# Patient Record
Sex: Female | Born: 1955
Health system: Southern US, Community
[De-identification: ages and names within clinical notes are randomized; demographics above are authoritative.]

## PROBLEM LIST (undated history)

## (undated) DIAGNOSIS — E7800 Pure hypercholesterolemia, unspecified: Secondary | ICD-10-CM

## (undated) DIAGNOSIS — R42 Dizziness and giddiness: Secondary | ICD-10-CM

## (undated) DIAGNOSIS — M5417 Radiculopathy, lumbosacral region: Secondary | ICD-10-CM

## (undated) DIAGNOSIS — F419 Anxiety disorder, unspecified: Secondary | ICD-10-CM

## (undated) DIAGNOSIS — T7840XA Allergy, unspecified, initial encounter: Secondary | ICD-10-CM

## (undated) DIAGNOSIS — M545 Low back pain, unspecified: Secondary | ICD-10-CM

## (undated) DIAGNOSIS — G4700 Insomnia, unspecified: Secondary | ICD-10-CM

## (undated) DIAGNOSIS — K219 Gastro-esophageal reflux disease without esophagitis: Secondary | ICD-10-CM

## (undated) DIAGNOSIS — I10 Essential (primary) hypertension: Secondary | ICD-10-CM

## (undated) DIAGNOSIS — J449 Chronic obstructive pulmonary disease, unspecified: Secondary | ICD-10-CM

## (undated) HISTORY — DX: Allergy, unspecified, initial encounter: T78.40XA

## (undated) HISTORY — DX: Insomnia, unspecified: G47.00

## (undated) HISTORY — DX: Chronic obstructive pulmonary disease, unspecified: J44.9

## (undated) HISTORY — DX: Radiculopathy, lumbosacral region: M54.17

## (undated) HISTORY — DX: Low back pain, unspecified: M54.50

## (undated) HISTORY — DX: Dizziness and giddiness: R42

## (undated) HISTORY — DX: Gastro-esophageal reflux disease without esophagitis: K21.9

## (undated) HISTORY — DX: Pure hypercholesterolemia, unspecified: E78.00

## (undated) HISTORY — DX: Anxiety disorder, unspecified: F41.9

## (undated) HISTORY — DX: Low back pain: M54.5

---

## 1994-10-06 HISTORY — PX: BREAST EXCISIONAL BIOPSY: SUR124

## 2004-12-29 ENCOUNTER — Emergency Department: Payer: Self-pay | Admitting: Emergency Medicine

## 2005-01-06 ENCOUNTER — Ambulatory Visit: Payer: Self-pay | Admitting: Urology

## 2005-03-10 ENCOUNTER — Ambulatory Visit: Payer: Self-pay

## 2005-10-05 ENCOUNTER — Other Ambulatory Visit: Payer: Self-pay

## 2005-10-05 ENCOUNTER — Emergency Department: Payer: Self-pay | Admitting: Emergency Medicine

## 2006-04-07 ENCOUNTER — Ambulatory Visit: Payer: Self-pay | Admitting: Family Medicine

## 2006-04-15 ENCOUNTER — Ambulatory Visit: Payer: Self-pay | Admitting: Family Medicine

## 2006-05-01 ENCOUNTER — Ambulatory Visit: Payer: Self-pay | Admitting: Family Medicine

## 2006-05-19 ENCOUNTER — Ambulatory Visit: Payer: Self-pay | Admitting: Family Medicine

## 2007-05-01 DIAGNOSIS — K219 Gastro-esophageal reflux disease without esophagitis: Secondary | ICD-10-CM | POA: Insufficient documentation

## 2007-05-01 DIAGNOSIS — K2100 Gastro-esophageal reflux disease with esophagitis, without bleeding: Secondary | ICD-10-CM | POA: Insufficient documentation

## 2007-08-14 ENCOUNTER — Ambulatory Visit: Payer: Self-pay | Admitting: Family Medicine

## 2007-08-25 ENCOUNTER — Ambulatory Visit: Payer: Self-pay | Admitting: Family Medicine

## 2007-11-15 IMAGING — US ULTRASOUND RIGHT BREAST
1 series · 17 of 22 positions shown · non-contrast
Comparison: none

REASON FOR EXAM: Right breast density   US if needed
COMMENTS:

PROCEDURE:     US  - US BREAST RIGHT  - April 15, 2006  [DATE]
RESULT:        At 1 o'clock and 3 o'clock there are small 4 mm cysts.  The
cyst at approximately 3 o'clock  corresponds to mammographic abnormality.
It is suggested that the patient resume yearly follow-up mammograms.

[Series 1: ultrasound right breast · 17 of 22 slices shown]
[im 1/22]
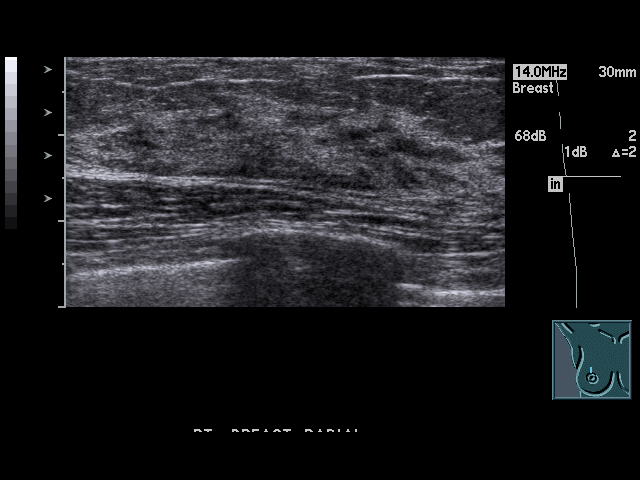
[im 2/22]
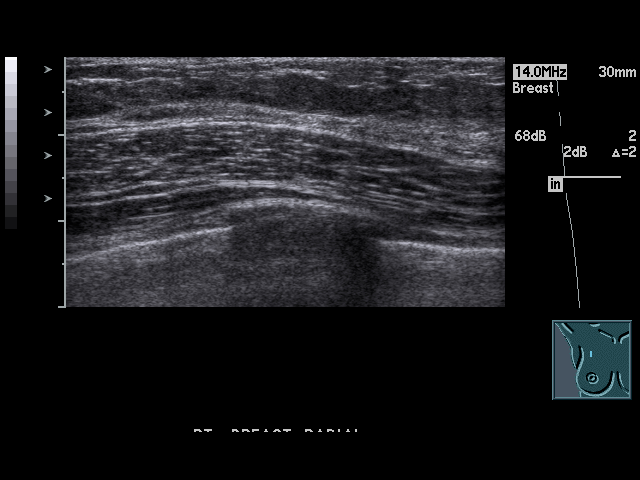
[im 4/22]
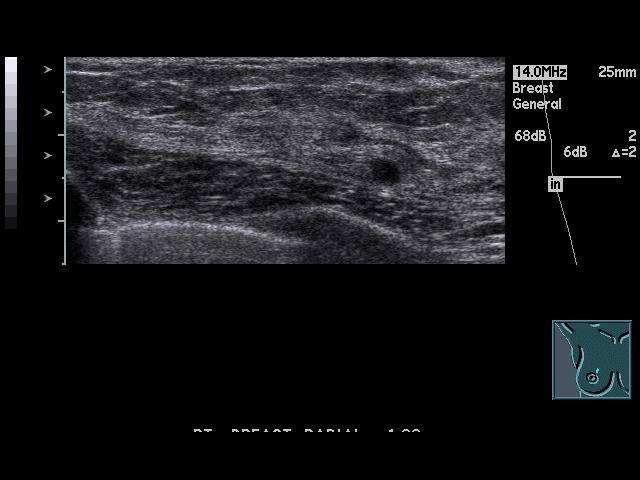
[im 5/22]
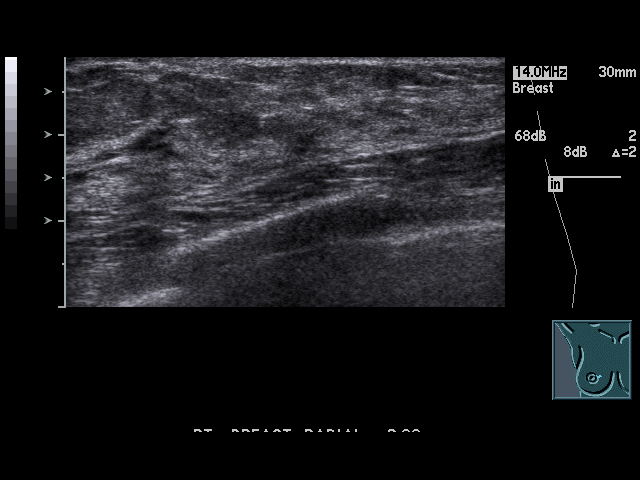
[im 6/22]
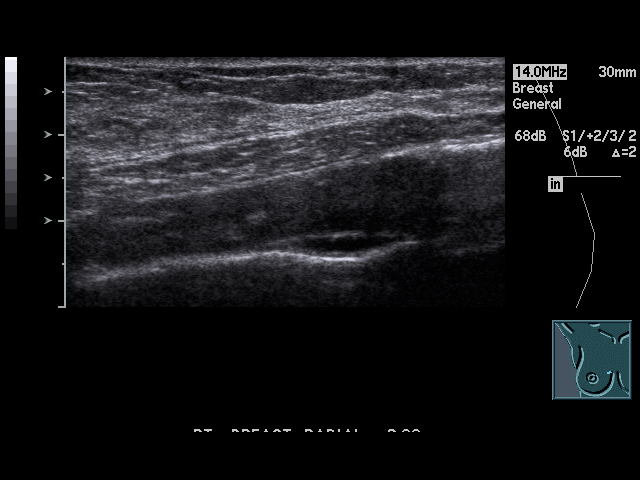
[im 8/22]
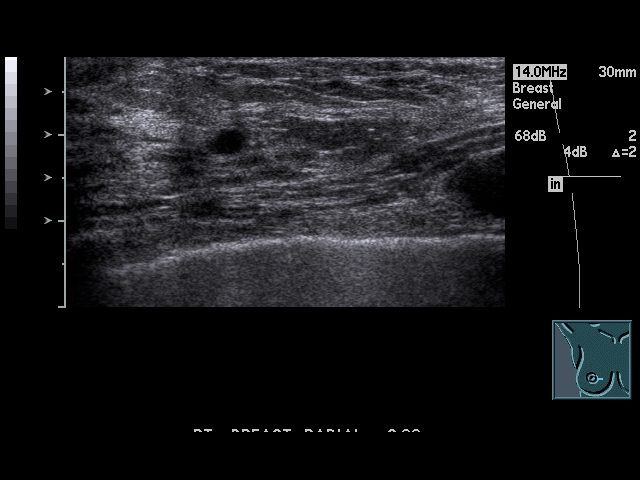
[im 9/22]
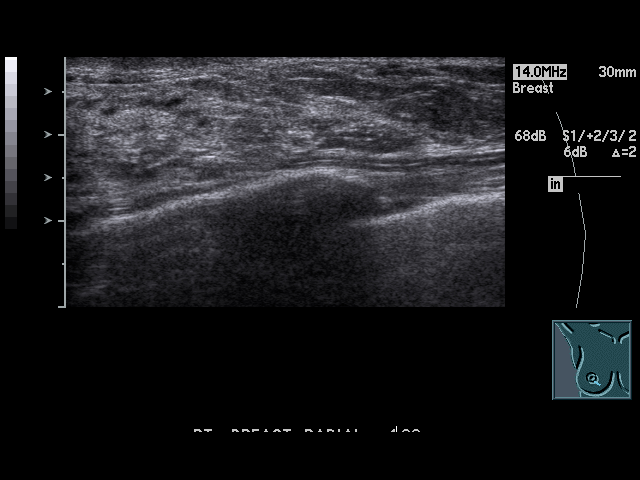
[im 10/22]
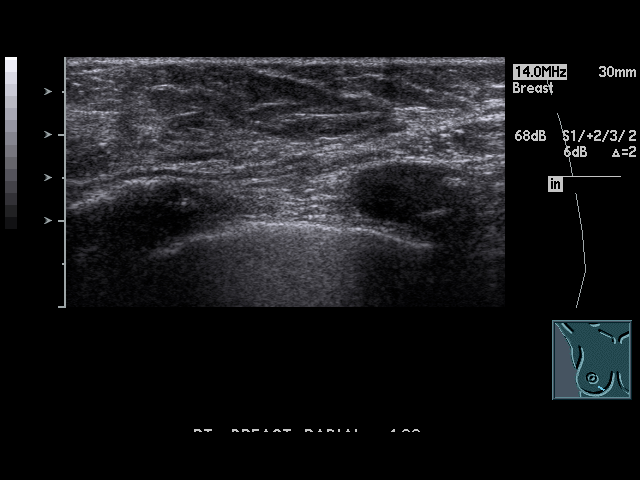
[im 12/22]
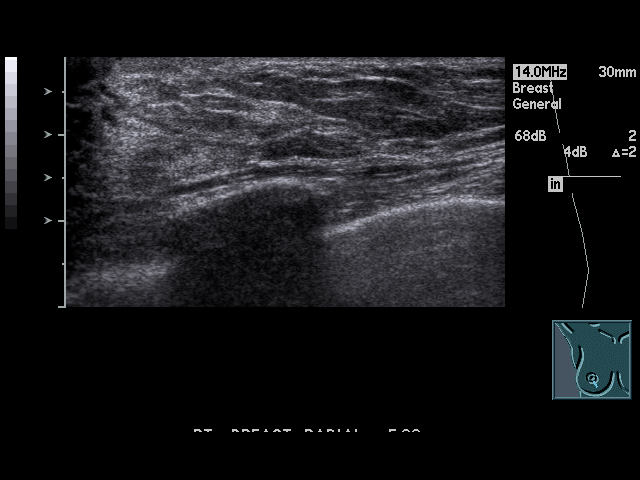
[im 13/22]
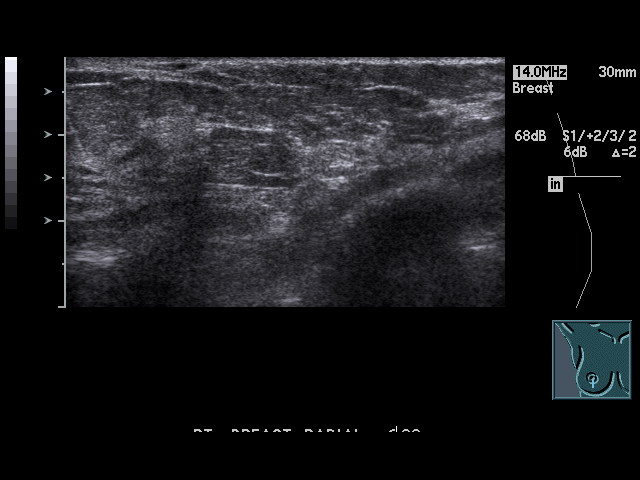
[im 14/22]
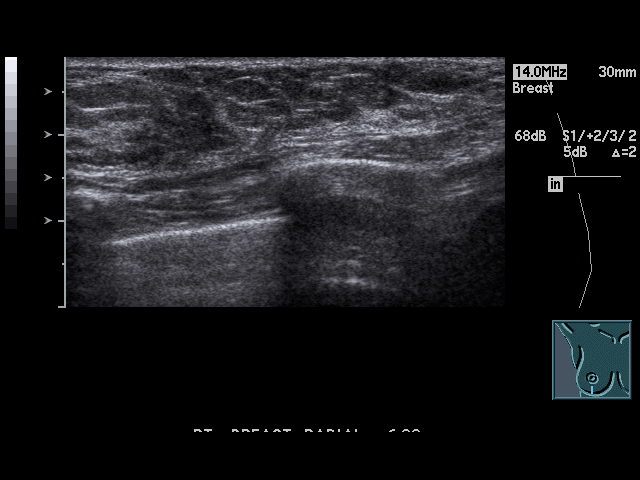
[im 15/22]
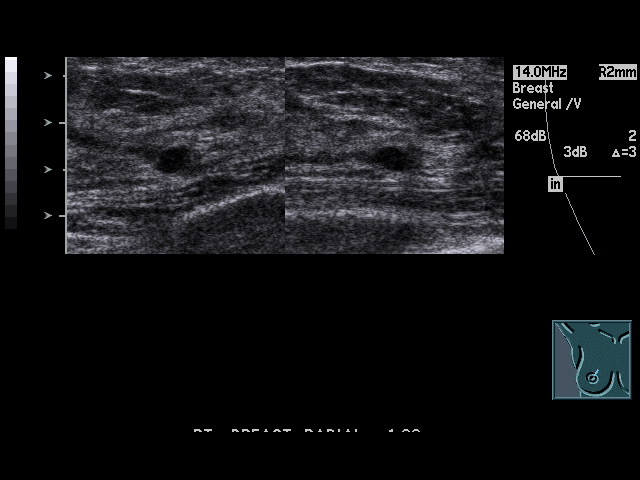
[im 17/22]
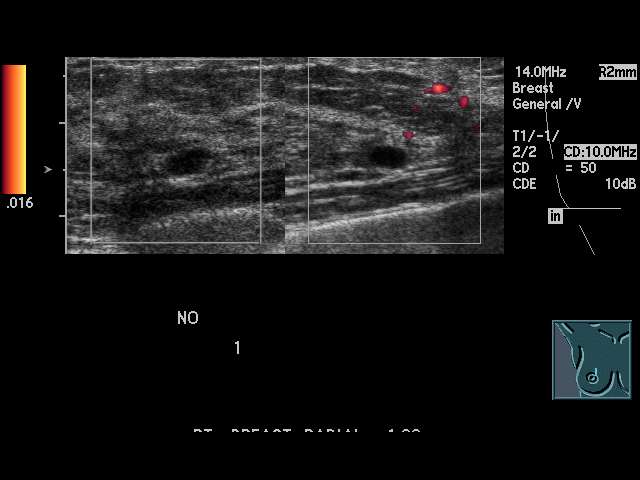
[im 18/22]
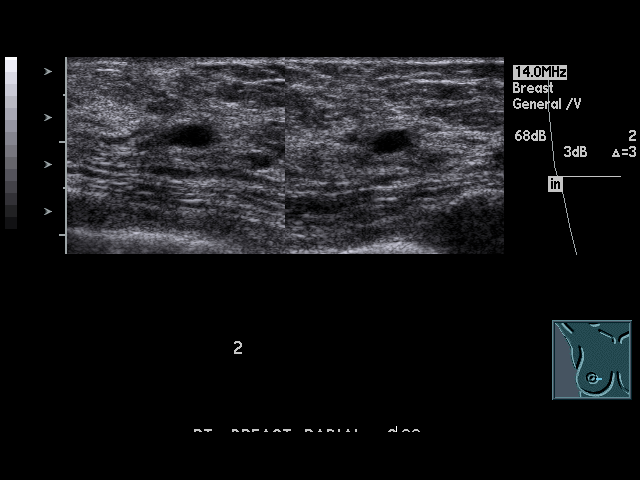
[im 19/22]
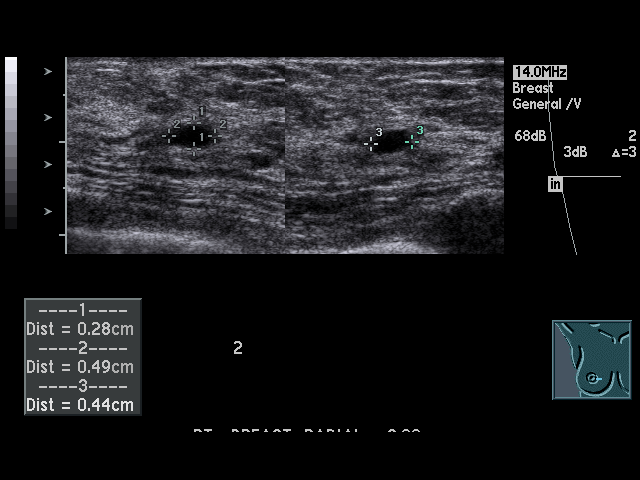
[im 21/22]
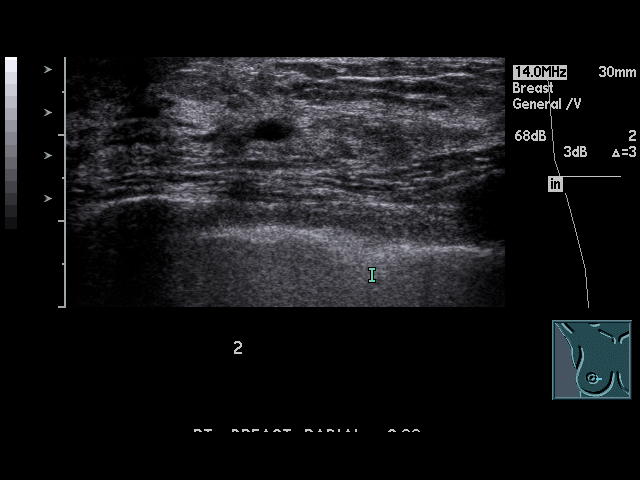
[im 22/22]
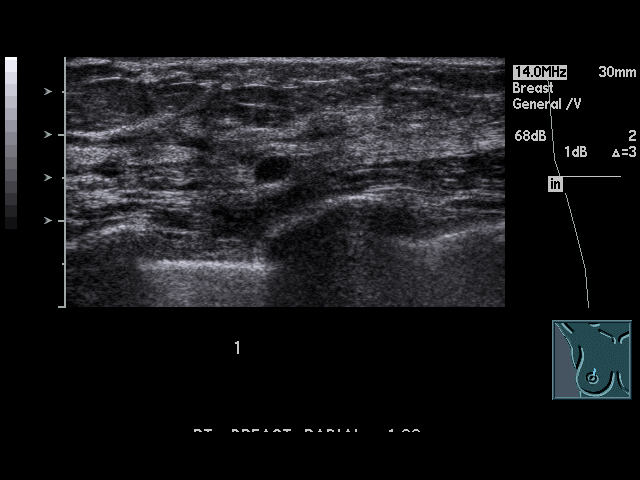

[17 of 22 positions shown; findings below may reference images not displayed]

IMPRESSION: BI-RADS: Category 2-Benign Findings.

A NEGATIVE MAMMOGRAM REPORT DOES NOT PRECLUDE BIOPSY OR OTHER EVALUATION OF
A CLINICALLY PALPABLE OR OTHERWISE SUSPICIOUS MASS OR LESION.   BREAST

## 2008-03-04 ENCOUNTER — Emergency Department: Payer: Self-pay | Admitting: Emergency Medicine

## 2008-11-19 ENCOUNTER — Emergency Department: Payer: Self-pay | Admitting: Emergency Medicine

## 2008-12-27 ENCOUNTER — Ambulatory Visit: Payer: Self-pay | Admitting: Family Medicine

## 2009-01-07 ENCOUNTER — Emergency Department: Payer: Self-pay | Admitting: Emergency Medicine

## 2009-02-04 ENCOUNTER — Emergency Department: Payer: Self-pay | Admitting: Emergency Medicine

## 2009-04-05 ENCOUNTER — Emergency Department: Payer: Self-pay | Admitting: Emergency Medicine

## 2009-06-28 ENCOUNTER — Other Ambulatory Visit: Payer: Self-pay | Admitting: Internal Medicine

## 2010-03-20 ENCOUNTER — Ambulatory Visit: Payer: Self-pay | Admitting: Family Medicine

## 2010-12-22 ENCOUNTER — Emergency Department: Payer: Self-pay | Admitting: Internal Medicine

## 2011-01-07 ENCOUNTER — Ambulatory Visit: Payer: Self-pay | Admitting: Family Medicine

## 2011-02-09 ENCOUNTER — Emergency Department: Payer: Self-pay | Admitting: Unknown Physician Specialty

## 2011-02-25 ENCOUNTER — Ambulatory Visit: Payer: Self-pay | Admitting: Family Medicine

## 2011-02-25 DIAGNOSIS — I369 Nonrheumatic tricuspid valve disorder, unspecified: Secondary | ICD-10-CM

## 2011-06-04 ENCOUNTER — Ambulatory Visit: Payer: Self-pay | Admitting: Family Medicine

## 2011-10-18 ENCOUNTER — Emergency Department: Payer: Self-pay | Admitting: *Deleted

## 2011-10-18 LAB — CBC
HCT: 42.5 % (ref 35.0–47.0)
HGB: 14.1 g/dL (ref 12.0–16.0)
MCV: 93 fL (ref 80–100)
Platelet: 231 10*3/uL (ref 150–440)
RBC: 4.56 10*6/uL (ref 3.80–5.20)
RDW: 14.1 % (ref 11.5–14.5)
WBC: 5.6 10*3/uL (ref 3.6–11.0)

## 2011-10-18 LAB — URINALYSIS, COMPLETE
Bacteria: NONE SEEN
Glucose,UR: NEGATIVE mg/dL (ref 0–75)
Ketone: NEGATIVE
Leukocyte Esterase: NEGATIVE
Nitrite: NEGATIVE
Ph: 6 (ref 4.5–8.0)
RBC,UR: 40 /HPF (ref 0–5)
Specific Gravity: 1.003 (ref 1.003–1.030)
Squamous Epithelial: <1

## 2011-10-18 LAB — DRUG SCREEN, URINE
Amphetamines, Ur Screen: NEGATIVE (ref ?–1000)
Benzodiazepine, Ur Scrn: NEGATIVE (ref ?–200)
Cannabinoid 50 Ng, Ur ~~LOC~~: NEGATIVE (ref ?–50)
MDMA (Ecstasy)Ur Screen: NEGATIVE (ref ?–500)
Opiate, Ur Screen: NEGATIVE (ref ?–300)
Phencyclidine (PCP) Ur S: NEGATIVE (ref ?–25)
Tricyclic, Ur Screen: NEGATIVE (ref ?–1000)

## 2011-10-18 LAB — COMPREHENSIVE METABOLIC PANEL
Alkaline Phosphatase: 86 U/L (ref 50–136)
Anion Gap: 10 (ref 7–16)
BUN: 11 mg/dL (ref 7–18)
Bilirubin,Total: 0.3 mg/dL (ref 0.2–1.0)
Calcium, Total: 9 mg/dL (ref 8.5–10.1)
Chloride: 106 mmol/L (ref 98–107)
Co2: 28 mmol/L (ref 21–32)
Creatinine: 0.59 mg/dL — ABNORMAL LOW (ref 0.60–1.30)
EGFR (Non-African Amer.): >60
SGOT(AST): 21 U/L (ref 15–37)
SGPT (ALT): 15 U/L
Total Protein: 7 g/dL (ref 6.4–8.2)

## 2011-10-18 LAB — CK TOTAL AND CKMB (NOT AT ARMC): CK-MB: 1.4 ng/mL (ref 0.5–3.6)

## 2011-10-18 LAB — TROPONIN I: Troponin-I: <0.02 ng/mL

## 2011-10-18 LAB — TSH: Thyroid Stimulating Horm: 1.81 u[IU]/mL

## 2012-01-26 DIAGNOSIS — R509 Fever, unspecified: Secondary | ICD-10-CM

## 2012-07-23 IMAGING — CR DG CHEST 2V
1 series · 2 of 2 positions shown · non-contrast
Comparison: none

REASON FOR EXAM: chest pain
COMMENTS:   May transport without cardiac monitor

[Series 1: view not recorded · 0.17mm/px · 2 of 2 slices shown]
[im 1/2]
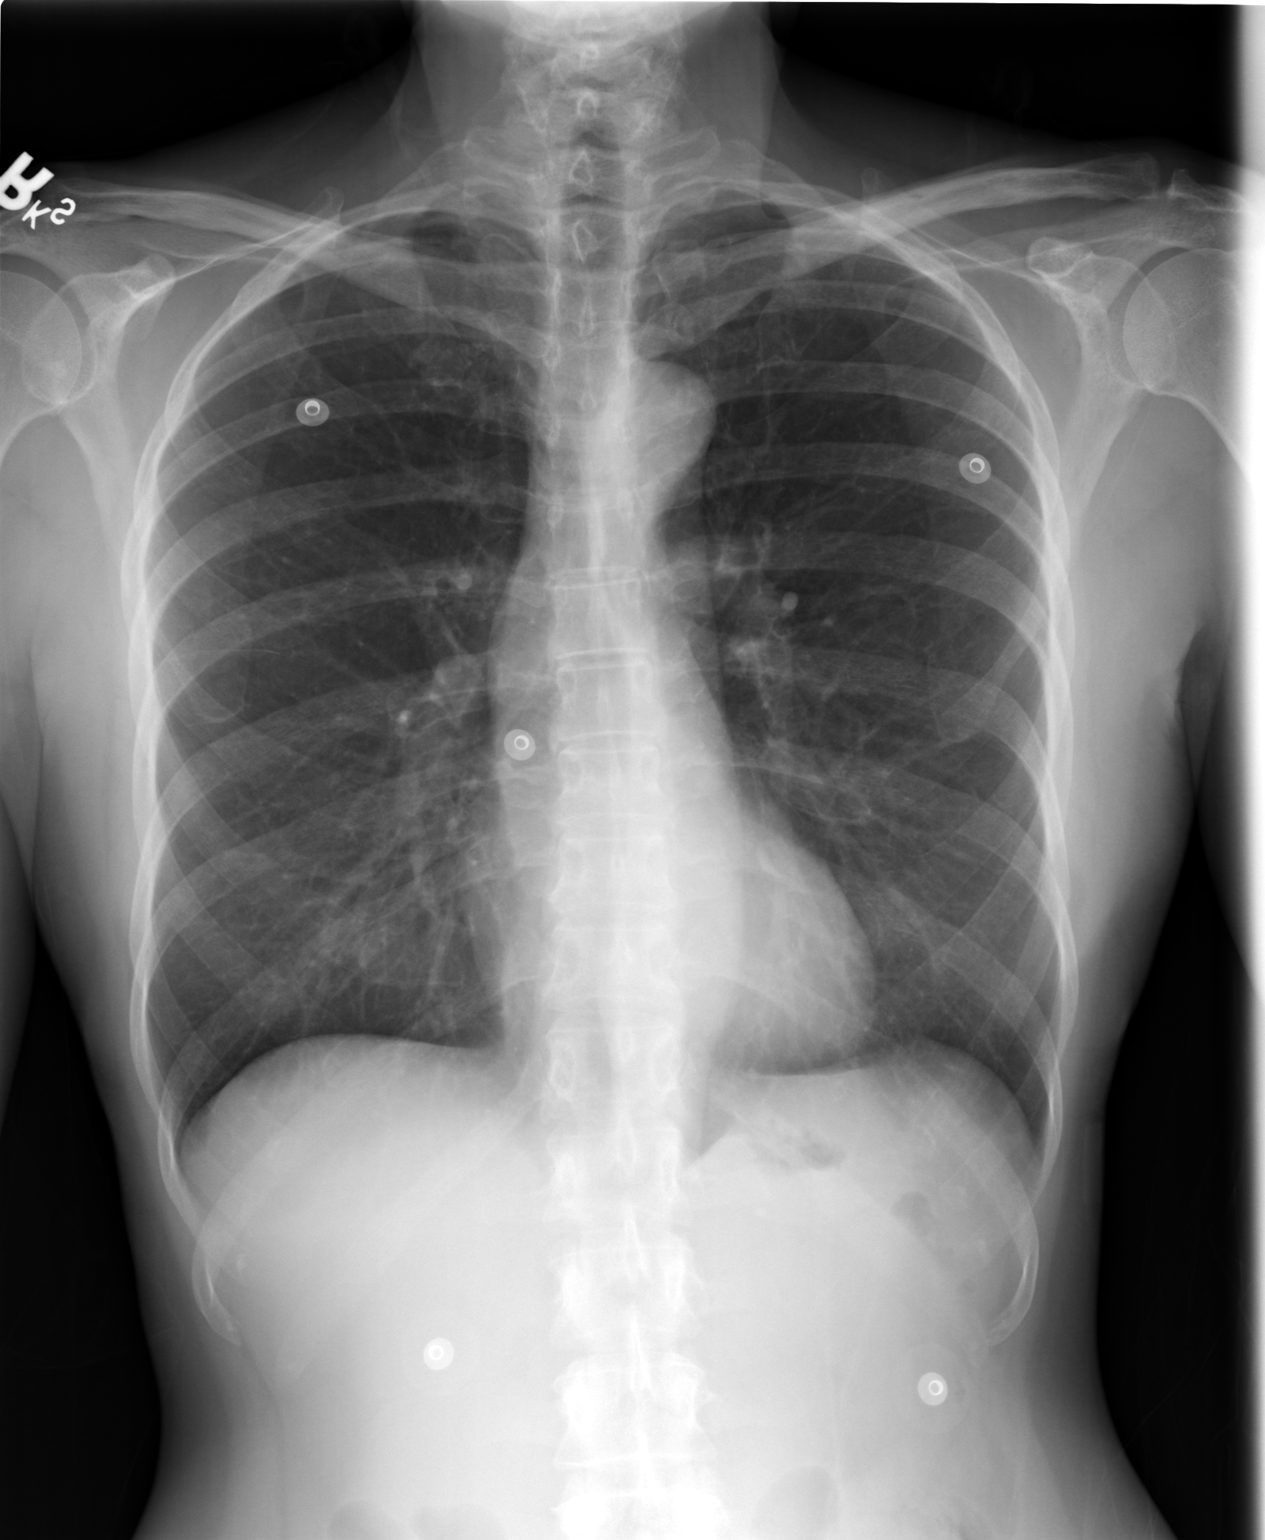
[im 2/2]
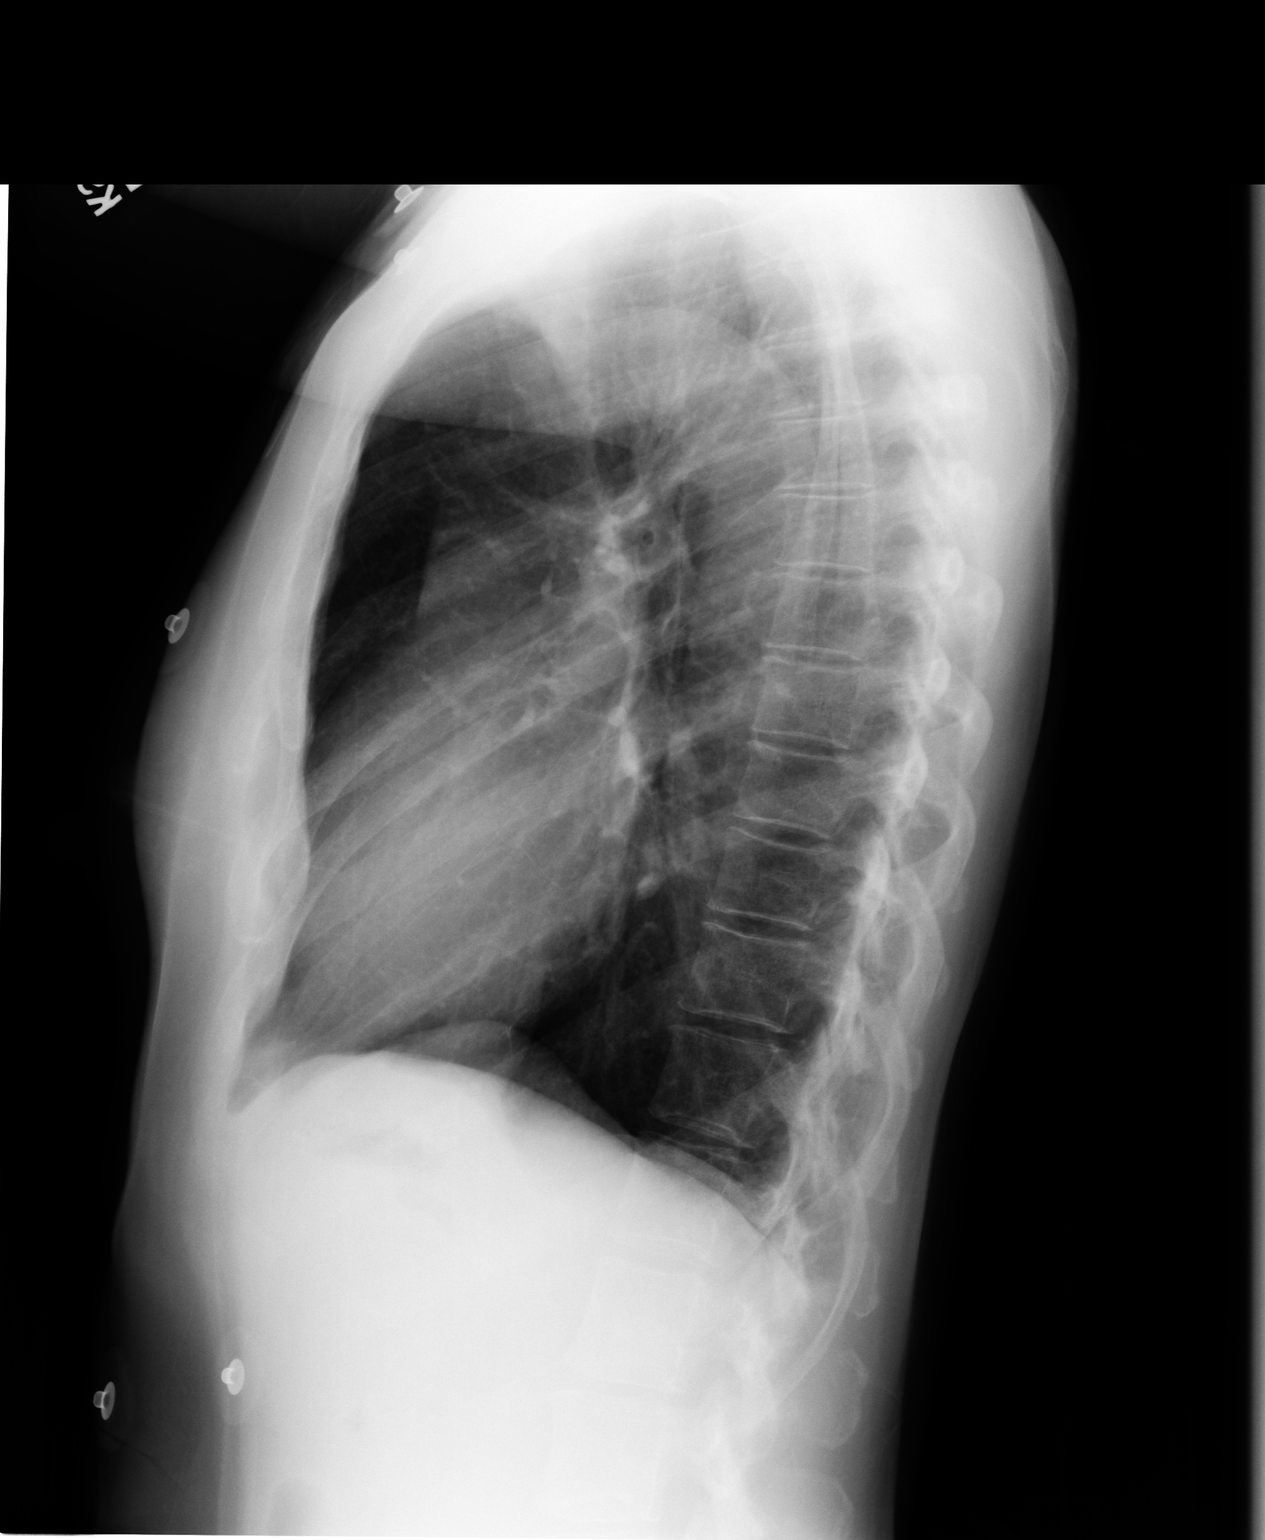

[2 of 2 positions shown; findings below may reference images not displayed]

PROCEDURE:     DXR - DXR CHEST PA (OR AP) AND LATERAL  - December 22, 2010  [DATE]

RESULT:     Comparison is made to the study of 04/05/2009.

The lungs are clear. The heart and pulmonary vessels are normal. The bony
and mediastinal structures are unremarkable. There is no effusion. There is
no pneumothorax or evidence of congestive failure.
IMPRESSION: No acute cardiopulmonary disease.

## 2012-09-23 ENCOUNTER — Ambulatory Visit: Payer: Self-pay | Admitting: Family Medicine

## 2012-11-01 ENCOUNTER — Ambulatory Visit: Payer: Self-pay | Admitting: Family Medicine

## 2013-02-26 ENCOUNTER — Emergency Department: Payer: Self-pay | Admitting: Emergency Medicine

## 2013-06-24 ENCOUNTER — Ambulatory Visit: Payer: Self-pay | Admitting: Family Medicine

## 2013-06-24 LAB — LIPID PANEL
Cholesterol: 170 mg/dL (ref 0–200)
HDL: 61 mg/dL (ref 35–70)
LDL CALC: 97 mg/dL
Triglycerides: 61 mg/dL (ref 40–160)

## 2013-12-02 ENCOUNTER — Ambulatory Visit: Payer: Self-pay | Admitting: Family Medicine

## 2014-02-20 ENCOUNTER — Emergency Department: Payer: Self-pay | Admitting: Emergency Medicine

## 2014-02-20 LAB — COMPREHENSIVE METABOLIC PANEL
ALK PHOS: 105 U/L
ANION GAP: 8 (ref 7–16)
Albumin: 3.7 g/dL (ref 3.4–5.0)
BUN: 22 mg/dL — ABNORMAL HIGH (ref 7–18)
Bilirubin,Total: 0.3 mg/dL (ref 0.2–1.0)
CALCIUM: 9.1 mg/dL (ref 8.5–10.1)
CHLORIDE: 108 mmol/L — AB (ref 98–107)
CREATININE: 0.98 mg/dL (ref 0.60–1.30)
Co2: 26 mmol/L (ref 21–32)
EGFR (African American): >60
EGFR (Non-African Amer.): >60
Glucose: 85 mg/dL (ref 65–99)
Osmolality: 286 (ref 275–301)
Potassium: 3.5 mmol/L (ref 3.5–5.1)
SGOT(AST): 22 U/L (ref 15–37)
SGPT (ALT): 17 U/L (ref 12–78)
Sodium: 142 mmol/L (ref 136–145)
TOTAL PROTEIN: 7.4 g/dL (ref 6.4–8.2)

## 2014-02-20 LAB — CBC
HCT: 40.6 % (ref 35.0–47.0)
HGB: 13.6 g/dL (ref 12.0–16.0)
MCH: 29.5 pg (ref 26.0–34.0)
MCHC: 33.5 g/dL (ref 32.0–36.0)
MCV: 88 fL (ref 80–100)
Platelet: 255 10*3/uL (ref 150–440)
RBC: 4.61 10*6/uL (ref 3.80–5.20)
RDW: 13.7 % (ref 11.5–14.5)
WBC: 4.5 10*3/uL (ref 3.6–11.0)

## 2014-02-20 LAB — CK TOTAL AND CKMB (NOT AT ARMC)
CK, TOTAL: 105 U/L
CK-MB: 1.1 ng/mL (ref 0.5–3.6)

## 2014-02-20 LAB — TROPONIN I: Troponin-I: <0.02 ng/mL

## 2014-11-29 ENCOUNTER — Ambulatory Visit: Payer: Self-pay

## 2015-03-23 ENCOUNTER — Other Ambulatory Visit: Payer: Self-pay | Admitting: Family Medicine

## 2015-03-23 NOTE — Telephone Encounter (Signed)
Called in rf for 1 month and told to return to office for refills.

## 2015-03-23 NOTE — Telephone Encounter (Signed)
Call in rx for Xanax 1 mo supply. rto before next rf needed

## 2015-04-06 ENCOUNTER — Other Ambulatory Visit: Payer: Self-pay

## 2015-04-06 MED ORDER — MECLIZINE HCL 25 MG PO TABS: 25.0000 mg | ORAL_TABLET | Freq: Three times a day (TID) | ORAL | Status: DC

## 2015-04-12 ENCOUNTER — Telehealth: Payer: Self-pay

## 2015-04-12 ENCOUNTER — Other Ambulatory Visit: Payer: Self-pay

## 2015-04-12 ENCOUNTER — Telehealth: Payer: Self-pay | Admitting: Family Medicine

## 2015-04-12 MED ORDER — MECLIZINE HCL 25 MG PO TABS: 25.0000 mg | ORAL_TABLET | Freq: Three times a day (TID) | ORAL | Status: DC

## 2015-04-12 NOTE — Telephone Encounter (Signed)
All med refills must be sent to [facility]

## 2015-04-12 NOTE — Telephone Encounter (Signed)
Patient received a call from her local pharmacy saying that she had a refill call in on Meclizine, however, it is suppose to go to [facility] (formally [facility]).

## 2015-04-13 NOTE — Telephone Encounter (Signed)
New script up front for [facility]  to pick up

## 2015-04-13 NOTE — Telephone Encounter (Signed)
Informed [facility] that prescription was ready for pick-up

## 2015-04-18 ENCOUNTER — Telehealth: Payer: Self-pay | Admitting: Family Medicine

## 2015-04-20 NOTE — Telephone Encounter (Signed)
[employer]

## 2015-06-27 ENCOUNTER — Telehealth: Payer: Self-pay | Admitting: Family Medicine

## 2015-06-27 NOTE — Telephone Encounter (Signed)
Patient states that [facility] had sent a request for her diuretic medication and we had not responded. Requesting that it be sent over. She will call you first thing to discuss this

## 2015-07-19 ENCOUNTER — Telehealth: Payer: Self-pay | Admitting: Family Medicine

## 2015-07-19 NOTE — Telephone Encounter (Signed)
No refills for this medicine until she returns to the office

## 2015-07-19 NOTE — Telephone Encounter (Signed)
NEEDS REFILL ON ALPRAZOLAM 0.5 MG [facility] ON [address] AND MECLIZINE 25MG  TO [facility]IC[facility]

## 2015-07-20 NOTE — Telephone Encounter (Signed)
Gave the pt the message and she was not happy for she said that she did not have the money and I told her about the Leonette Mostcharles drew that is based on your income. She said that she would see what she could do.

## 2015-09-05 ENCOUNTER — Other Ambulatory Visit: Payer: Self-pay | Admitting: Family Medicine

## 2015-09-06 NOTE — Telephone Encounter (Signed)
Patient informed. 

## 2015-10-11 ENCOUNTER — Encounter (INDEPENDENT_AMBULATORY_CARE_PROVIDER_SITE_OTHER): Payer: Self-pay

## 2015-10-11 ENCOUNTER — Ambulatory Visit (INDEPENDENT_AMBULATORY_CARE_PROVIDER_SITE_OTHER): Payer: Self-pay | Admitting: Family Medicine

## 2015-10-11 ENCOUNTER — Encounter: Payer: Self-pay | Admitting: Family Medicine

## 2015-10-11 VITALS — BP 118/78 | HR 92 | Temp 98.1°F | Resp 18 | Ht 60.0 in | Wt 139.4 lb

## 2015-10-11 DIAGNOSIS — E785 Hyperlipidemia, unspecified: Secondary | ICD-10-CM | POA: Insufficient documentation

## 2015-10-11 DIAGNOSIS — G8929 Other chronic pain: Secondary | ICD-10-CM

## 2015-10-11 DIAGNOSIS — J410 Simple chronic bronchitis: Secondary | ICD-10-CM | POA: Insufficient documentation

## 2015-10-11 DIAGNOSIS — K589 Irritable bowel syndrome without diarrhea: Secondary | ICD-10-CM

## 2015-10-11 DIAGNOSIS — M549 Dorsalgia, unspecified: Secondary | ICD-10-CM

## 2015-10-11 DIAGNOSIS — F411 Generalized anxiety disorder: Secondary | ICD-10-CM | POA: Insufficient documentation

## 2015-10-11 DIAGNOSIS — J431 Panlobular emphysema: Secondary | ICD-10-CM

## 2015-10-11 DIAGNOSIS — K5909 Other constipation: Secondary | ICD-10-CM | POA: Insufficient documentation

## 2015-10-11 MED ORDER — ALPRAZOLAM 0.5 MG PO TABS: 0.5000 mg | ORAL_TABLET | Freq: Four times a day (QID) | ORAL | Status: DC | PRN

## 2015-10-11 MED ORDER — LUBIPROSTONE 8 MCG PO CAPS: 8.0000 ug | ORAL_CAPSULE | Freq: Two times a day (BID) | ORAL | Status: DC

## 2015-10-11 MED ORDER — TRIAMTERENE-HCTZ 37.5-25 MG PO TABS: ORAL_TABLET | ORAL | Status: DC

## 2015-10-11 MED ORDER — MECLIZINE HCL 25 MG PO TABS: 25.0000 mg | ORAL_TABLET | Freq: Three times a day (TID) | ORAL | Status: DC

## 2015-10-11 NOTE — Progress Notes (Signed)
Name: Kristine Lowe   MRN: [id number]    DOB: 10/11/1955   Date:10/11/2015       Progress Note  Subjective  Chief Complaint  Chief Complaint  Patient presents with  . Anxiety  . Hyperlipidemia  . Insomnia    HPI  Hyperlipidemia  Patient has a history of hyperlipidemia for over 5 years.  Current medical regimen consist of atorvastatin 40 mg daily at bedtime .  Compliance is intermittent .  Diet and exercise are currently followed intermittently .  Risk factors for cardiovascular disease include hyperlipidemia agent in distress .   There have been no side effects from the medication.    COPD history of present illness  Patient has not smoked now in 2 years. She is not using her Spiriva on a regular basis she has not required her rescue inhaler recently symptoms consist mainly of some dyspnea on exertion minimal cough no wheezing.  Insomnia history of present illness  History of insomnia of over 5 years. She has both difficulty falling asleep and staying asleep. She has a long-standing history of anxiety as well  Anxiety history of present illness  Patient has a long-standing history of generalized anxiety manifested by racing thoughts sweaty palms palpitations paresthesias insomnia. She has been on alprazolam 0.5  3 times a day from number of years but states his systolic always fully effective so she needs another one particularly for nighttime usage she has difficulty falling asleep and staying asleep.   Past Medical History  Diagnosis Date  . Anxiety   . Insomnia     Social History  Substance Use Topics  . Smoking status: Not on file  . Smokeless tobacco: Not on file  . Alcohol Use: Not on file     Current outpatient prescriptions:  .  albuterol (PROAIR HFA) 108 (90 Base) MCG/ACT inhaler, Inhale into the lungs., Disp: , Rfl:  .  ALPRAZolam (XANAX) 0.5 MG tablet, Take 0.5 mg by mouth at bedtime as needed for anxiety (return to office for next RF). , Disp: , Rfl:   .  atorvastatin (LIPITOR) 40 MG tablet, Take by mouth., Disp: , Rfl:  .  cyclobenzaprine (FLEXERIL) 10 MG tablet, Take by mouth., Disp: , Rfl:  .  fluticasone (FLONASE) 50 MCG/ACT nasal spray, Place into the nose., Disp: , Rfl:  .  lubiprostone (AMITIZA) 8 MCG capsule, Take by mouth., Disp: , Rfl:  .  meclizine (ANTIVERT) 25 MG tablet, Take 1 tablet (25 mg total) by mouth every 8 (eight) hours., Disp: 30 tablet, Rfl: 0 .  potassium chloride SA (K-DUR,KLOR-CON) 20 MEQ tablet, Take by mouth., Disp: , Rfl:  .  tiotropium (SPIRIVA) 18 MCG inhalation capsule, Place into inhaler and inhale., Disp: , Rfl:  .  triamterene-hydrochlorothiazide (MAXZIDE-25) 37.5-25 MG tablet, TAKE ONE TABLET BY MOUTH EVERY DAY. REPLACES LASIX., Disp: 90 tablet, Rfl: 0  Allergies  Allergen Reactions  . Penicillins Swelling    Review of Systems  Constitutional: Negative for fever, chills and weight loss.  HENT: Positive for congestion. Negative for hearing loss, sore throat and tinnitus.   Eyes: Negative for blurred vision, double vision and redness.  Respiratory: Positive for shortness of breath. Negative for cough and hemoptysis.   Cardiovascular: Positive for palpitations. Negative for chest pain, orthopnea, claudication and leg swelling.  Gastrointestinal: Positive for constipation. Negative for heartburn, nausea, vomiting, diarrhea and blood in stool.  Genitourinary: Negative for dysuria, urgency, frequency and hematuria.  Musculoskeletal: Positive for back pain. Negative for myalgias,  joint pain, falls and neck pain.  Skin: Negative for itching.  Neurological: Positive for headaches. Negative for dizziness, tingling, tremors, focal weakness, seizures, loss of consciousness and weakness.  Endo/Heme/Allergies: Does not bruise/bleed easily.  Psychiatric/Behavioral: Negative for depression and substance abuse. The patient is nervous/anxious and has insomnia.      Objective  Filed Vitals:   10/11/15 1057  BP:  118/78  Pulse: 92  Temp: 98.1 F (36.7 C)  Resp: 18  Height: 5' (1.524 m)  Weight: 139 lb 6 oz (63.22 kg)  SpO2: 97%     Physical Exam  Constitutional: She is oriented to person, place, and time and well-developed, well-nourished, and in no distress.  HENT:  Head: Normocephalic.  Eyes: EOM are normal. Pupils are equal, round, and reactive to light.  Neck: Normal range of motion. No thyromegaly present.  Cardiovascular: Normal rate, regular rhythm and normal heart sounds.   No murmur heard. Pulmonary/Chest: Effort normal and breath sounds normal.  Abdominal: Soft. Bowel sounds are normal.  Musculoskeletal: Normal range of motion. She exhibits no edema.  Neurological: She is alert and oriented to person, place, and time. No cranial nerve deficit. Gait normal.  Skin: Skin is warm and dry. No rash noted.  Psychiatric: Memory normal.  Anxious and loquacious as is her baseline      Assessment & Plan  1. Generalized anxiety disorder Continue alprazolam - ALPRAZolam (XANAX) 0.5 MG tablet; Take 1 tablet (0.5 mg total) by mouth 4 (four) times daily as needed for anxiety (return to office for next RF).  Dispense: 120 tablet; Refill: 2  2. Panlobular emphysema (HCC) Encouraged to use her inhaler and Spiriva on a regular basis basis - albuterol (PROAIR HFA) 108 (90 Base) MCG/ACT inhaler; Inhale into the lungs. - tiotropium (SPIRIVA) 18 MCG inhalation capsule; Place into inhaler and inhale.  3. Chronic back pain Continue Flexeril - cyclobenzaprine (FLEXERIL) 10 MG tablet; Take by mouth.  4. IBS (irritable bowel syndrome) Continue Amitiza - lubiprostone (AMITIZA) 8 MCG capsule; Take 1 capsule (8 mcg total) by mouth 2 (two) times daily with a meal.  Dispense: 60 capsule; Refill: 3  5. Hyperlipidemia Continue atorvastatin  - atorvastatin (LIPITOR) 40 MG tablet; Take by mouth. Lipid panel and CMP as soon as insurance is enforced

## 2015-10-23 ENCOUNTER — Other Ambulatory Visit: Payer: Self-pay | Admitting: Family Medicine

## 2015-12-05 ENCOUNTER — Encounter: Payer: Self-pay | Admitting: *Deleted

## 2015-12-05 ENCOUNTER — Ambulatory Visit: Payer: Self-pay | Attending: Oncology | Admitting: *Deleted

## 2015-12-05 ENCOUNTER — Ambulatory Visit
Admission: RE | Admit: 2015-12-05 | Discharge: 2015-12-05 | Disposition: A | Discharge status: Home / Self Care | Payer: Self-pay | Source: Ambulatory Visit | Attending: Oncology | Admitting: Oncology

## 2015-12-05 ENCOUNTER — Other Ambulatory Visit: Payer: Self-pay | Admitting: *Deleted

## 2015-12-05 VITALS — BP 121/79 | HR 89 | Temp 97.7°F | Resp 20 | Ht 61.81 in | Wt 142.5 lb

## 2015-12-05 DIAGNOSIS — Z0000 Encounter for general adult medical examination without abnormal findings: Secondary | ICD-10-CM

## 2015-12-05 NOTE — Progress Notes (Signed)
Subjective:     Patient ID: Kristine Lowe, female   DOB: 1955-12-27, 60 y.o.   MRN: [id number]  HPI   Review of Systems     Objective:   Physical Exam  Pulmonary/Chest: Right breast exhibits no inverted nipple, no mass, no nipple discharge, no skin change and no tenderness. Left breast exhibits no inverted nipple, no mass, no nipple discharge, no skin change and no tenderness. Breasts are symmetrical.  Abdominal: There is no splenomegaly or hepatomegaly.  Genitourinary:    No breast swelling, tenderness, discharge or bleeding. No labial fusion. There is no rash, tenderness, lesion or injury on the right labia. There is no rash, tenderness, lesion or injury on the left labia. No erythema, tenderness or bleeding in the vagina. No foreign body around the vagina. No signs of injury around the vagina. No vaginal discharge found.       Assessment:     60 year old Black female returns to [facility] for annual exam.  Clinical breast exam unremarkable.  Taught self breast awareness.  Pelvic exam reveals a raised darkened, scaly pruritic rash at the suprapubic area.  Patient states she has been using vitamin E oil with fair relief.  Also states that using her nylon panties make it worse.  Patient has been screened for eligibility.  She does not have any insurance, Medicare or Medicaid.  She also meets financial eligibility.  Hand-out given on the Affordable Care Act.     Plan:     Screening mammogram ordered.  Patient is to try over the counter Lotrimin cream for the rash, wear the cotton panties, and keep the area dry.  She is agreeable.  Will follow-up per protocol.

## 2015-12-05 NOTE — Patient Instructions (Signed)
Gave patient hand-out, Women Staying Healthy, Active and Well from [facility], with education on breast health, pap smears, heart and colon health. 

## 2015-12-28 ENCOUNTER — Encounter: Payer: Self-pay | Admitting: *Deleted

## 2015-12-28 NOTE — Progress Notes (Signed)
Letter mailed from the [facility] to inform patient of her normal mammogram results.  Patient is to follow-up with annual screening in one year.  HSIS to Christy. 

## 2016-02-11 ENCOUNTER — Ambulatory Visit: Payer: Self-pay | Admitting: Family Medicine

## 2016-02-11 ENCOUNTER — Other Ambulatory Visit: Payer: Self-pay

## 2016-02-11 DIAGNOSIS — J431 Panlobular emphysema: Secondary | ICD-10-CM

## 2016-02-11 DIAGNOSIS — K589 Irritable bowel syndrome without diarrhea: Secondary | ICD-10-CM

## 2016-02-11 MED ORDER — POTASSIUM CHLORIDE CRYS ER 20 MEQ PO TBCR: 20.0000 meq | EXTENDED_RELEASE_TABLET | Freq: Every day | ORAL | Status: DC

## 2016-02-11 MED ORDER — LUBIPROSTONE 8 MCG PO CAPS
8.0000 ug | ORAL_CAPSULE | Freq: Two times a day (BID) | ORAL | Status: DC
Start: 2016-02-11 — End: 2017-02-13

## 2016-02-11 MED ORDER — MECLIZINE HCL 25 MG PO TABS: 25.0000 mg | ORAL_TABLET | Freq: Three times a day (TID) | ORAL | Status: DC

## 2016-02-11 MED ORDER — TRIAMTERENE-HCTZ 37.5-25 MG PO TABS: ORAL_TABLET | ORAL | Status: DC

## 2016-02-11 MED ORDER — FLUTICASONE PROPIONATE 50 MCG/ACT NA SUSP: 2.0000 | Freq: Every day | NASAL | Status: DC

## 2016-02-15 ENCOUNTER — Ambulatory Visit (INDEPENDENT_AMBULATORY_CARE_PROVIDER_SITE_OTHER): Payer: Self-pay | Admitting: Family Medicine

## 2016-02-15 ENCOUNTER — Encounter: Payer: Self-pay | Admitting: Family Medicine

## 2016-02-15 VITALS — BP 118/74 | HR 86 | Temp 98.6°F | Resp 16 | Ht 62.0 in | Wt 140.6 lb

## 2016-02-15 DIAGNOSIS — K5909 Other constipation: Secondary | ICD-10-CM

## 2016-02-15 DIAGNOSIS — R945 Abnormal results of liver function studies: Secondary | ICD-10-CM | POA: Insufficient documentation

## 2016-02-15 DIAGNOSIS — E785 Hyperlipidemia, unspecified: Secondary | ICD-10-CM

## 2016-02-15 DIAGNOSIS — G4733 Obstructive sleep apnea (adult) (pediatric): Secondary | ICD-10-CM

## 2016-02-15 DIAGNOSIS — E559 Vitamin D deficiency, unspecified: Secondary | ICD-10-CM | POA: Insufficient documentation

## 2016-02-15 DIAGNOSIS — L309 Dermatitis, unspecified: Secondary | ICD-10-CM | POA: Insufficient documentation

## 2016-02-15 DIAGNOSIS — F411 Generalized anxiety disorder: Secondary | ICD-10-CM

## 2016-02-15 DIAGNOSIS — R739 Hyperglycemia, unspecified: Secondary | ICD-10-CM | POA: Insufficient documentation

## 2016-02-15 DIAGNOSIS — J019 Acute sinusitis, unspecified: Secondary | ICD-10-CM | POA: Insufficient documentation

## 2016-02-15 DIAGNOSIS — K5900 Constipation, unspecified: Secondary | ICD-10-CM

## 2016-02-15 DIAGNOSIS — R7989 Other specified abnormal findings of blood chemistry: Secondary | ICD-10-CM | POA: Insufficient documentation

## 2016-02-15 DIAGNOSIS — J410 Simple chronic bronchitis: Secondary | ICD-10-CM

## 2016-02-15 MED ORDER — ALPRAZOLAM 0.5 MG PO TABS: 0.5000 mg | ORAL_TABLET | Freq: Four times a day (QID) | ORAL | Status: DC | PRN

## 2016-02-15 NOTE — Progress Notes (Signed)
Name: Kristine PandaCarolyn Ann Wisman   MRN: [id number]    DOB: 05/20/1956   Date:02/15/2016       Progress Note  Subjective  Chief Complaint  Chief Complaint  Patient presents with  . Medication Refill    alprazolam and meclizine    HPI  HTN: taking bp medication and gets dizzy when she first gets up and sometimes during the day. He feels light headed. No ringing in ears or hearing loss.  GAD: she has a long history of anxiety, she has been taking Alprazolam 0.5 mg up to four times daily to control her symptoms. She states that Zoloft cost her to shop lift in the past and is afraid of taking SSRI. She has occasional panic attacks. Worse symptom is when she first wakes up  Emphysema: she smoked for many years, she quit /switched to vapor in 2014 . She has been taking Spiriva ( gets it through [facility] ), no cough, she has wheezing very seldom with change of weather. No SOB.  Hyperlipidemia: she is no longer taking Atorvastatin, she has been unable to afford labs because she does not have any insurance  Chronic constipation : she has been taking Amitiza, able to have daily bowel movements daily, no straining or blood in stools. Due for colonoscopy but can't afford it  Patient Active Problem List   Diagnosis Date Noted  . Avitaminosis D 02/15/2016  . Obstructive apnea 02/15/2016  . Blood glucose elevated 02/15/2016  . Abnormal LFTs 02/15/2016  . Dermatitis, eczematoid 02/15/2016  . Generalized anxiety disorder 10/11/2015  . Panlobular emphysema (HCC) 10/11/2015  . Chronic back pain 10/11/2015  . IBS (irritable bowel syndrome) 10/11/2015  . Hyperlipidemia 10/11/2015  . Reflux esophagitis 05/01/2007    Past Surgical History  Procedure Laterality Date  . Breast excisional biopsy Left 1996    neg    Family History  Problem Relation Age of Onset  . Breast cancer Mother 4450  . Breast cancer Maternal Aunt 60  . Breast cancer Paternal Grandmother     Social History   Social History  .  Marital Status: Married    Spouse Name: N/A  . Number of Children: N/A  . Years of Education: N/A   Occupational History  . Not on file.   Social History Main Topics  . Smoking status: Never Smoker   . Smokeless tobacco: Not on file  . Alcohol Use: No  . Drug Use: No  . Sexual Activity: Not Currently   Other Topics Concern  . Not on file   Social History Narrative     Current outpatient prescriptions:  .  albuterol (PROAIR HFA) 108 (90 Base) MCG/ACT inhaler, Inhale into the lungs., Disp: , Rfl:  .  ALPRAZolam (XANAX) 0.5 MG tablet, Take 1 tablet (0.5 mg total) by mouth 4 (four) times daily as needed for anxiety (return to office for next RF)., Disp: 60 tablet, Rfl: 2 .  cyclobenzaprine (FLEXERIL) 10 MG tablet, Take by mouth., Disp: , Rfl:  .  fluticasone (FLONASE) 50 MCG/ACT nasal spray, Place 2 sprays into both nostrils daily., Disp: 16 g, Rfl: 0 .  lubiprostone (AMITIZA) 8 MCG capsule, Take 1 capsule (8 mcg total) by mouth 2 (two) times daily with a meal., Disp: 60 capsule, Rfl: 3 .  meclizine (ANTIVERT) 25 MG tablet, Take 1 tablet (25 mg total) by mouth every 8 (eight) hours., Disp: 30 tablet, Rfl: 0 .  potassium chloride SA (K-DUR,KLOR-CON) 20 MEQ tablet, Take 1 tablet (20 mEq total)  by mouth daily., Disp: 30 tablet, Rfl: 0 .  tiotropium (SPIRIVA) 18 MCG inhalation capsule, Place into inhaler and inhale., Disp: , Rfl:  .  triamterene-hydrochlorothiazide (MAXZIDE-25) 37.5-25 MG tablet, TAKE ONE TABLET BY MOUTH EVERY DAY. REPLACES LASIX., Disp: 90 tablet, Rfl: 0  Allergies  Allergen Reactions  . Penicillins Swelling  . Simvastatin      ROS  Constitutional: Negative for fever or weight change.  Respiratory: Negative for cough and shortness of breath.   Cardiovascular: Negative for chest pain or palpitations.  Gastrointestinal: Negative for abdominal pain, no bowel changes.  Musculoskeletal: Negative for gait problem or joint swelling.  Skin: Negative for rash.   Neurological: Negative for dizziness or headache.  No other specific complaints in a complete review of systems (except as listed in HPI above).  Objective  Filed Vitals:   02/15/16 1105  BP: 118/74  Pulse: 86  Temp: 98.6 F (37 C)  TempSrc: Oral  Resp: 16  Height:  (1.575 m)  Weight: 140 lb 9.6 oz (63.776 kg)  SpO2: 96%    Body mass index is 25.71 kg/(m^2).  Physical Exam  Constitutional: Patient appears well-developed and well-nourished. No distress.  HEENT: head atraumatic, normocephalic, pupils equal and reactive to light, neck supple, throat within normal limits Cardiovascular: Normal rate, regular rhythm and normal heart sounds.  No murmur heard. No BLE edema. Pulmonary/Chest: Effort normal and breath sounds normal. No respiratory distress. Abdominal: Soft.  There is no tenderness. Psychiatric: Patient has a normal mood and affect. behavior is normal. Judgment and thought content normal.  PHQ2/9: Depression screen [facility] 2/9 02/15/2016 10/11/2015  Decreased Interest 0 0  Down, Depressed, Hopeless 0 0  PHQ - 2 Score 0 0    Fall Risk: Fall Risk  02/15/2016 10/11/2015  Falls in the past year? No No    Functional Status Survey: Is the patient deaf or have difficulty hearing?: No Does the patient have difficulty seeing, even when wearing glasses/contacts?: No Does the patient have difficulty concentrating, remembering, or making decisions?: No Does the patient have difficulty walking or climbing stairs?: No Does the patient have difficulty dressing or bathing?: No Does the patient have difficulty doing errands alone such as visiting a doctor's office or shopping?: No   Assessment & Plan  1. Generalized anxiety disorder  We will decrease from 4 daily to 2 daily, she does not take it four times daily now, and explained don't feel comfortable giving her 120 tablets of alprazolam daily  - ALPRAZolam (XANAX) 0.5 MG tablet; Take 1 tablet (0.5 mg total) by mouth 4  (four) times daily as needed for anxiety (return to office for next RF).  Dispense: 60 tablet; Refill: 2  2. Chronic constipation  continue Amitiza  3. Simple Chronic Bronchitis  (HCC)  She quit smoking, doing well on Spiriva  4. Obstructive apnea  Never started CPAP  5. Hyperlipidemia  Due for labs, but can't afford it, off Lipitor for a long time

## 2016-03-09 ENCOUNTER — Other Ambulatory Visit: Payer: Self-pay | Admitting: Family Medicine

## 2016-03-11 ENCOUNTER — Other Ambulatory Visit: Payer: Self-pay | Admitting: Family Medicine

## 2016-03-13 NOTE — Telephone Encounter (Signed)
Patient requesting refill. 

## 2016-03-13 NOTE — Telephone Encounter (Signed)
Patient had prescriptions sent in to pharmacy on 02-11-16 and was seen on 02-15-16. States that you never sent any refills to her pharmacy for her 02-15-16 visit. States that you also gave her a 90 day supply of triameterene but only gave her a 30day supply of potassium. Is it possible to give her a 2 month supply of potassium.

## 2016-03-24 ENCOUNTER — Encounter: Payer: Self-pay | Admitting: Pharmacist

## 2016-03-24 ENCOUNTER — Encounter (INDEPENDENT_AMBULATORY_CARE_PROVIDER_SITE_OTHER): Payer: Self-pay

## 2016-04-18 ENCOUNTER — Other Ambulatory Visit: Payer: Self-pay | Admitting: Family Medicine

## 2016-04-18 NOTE — Telephone Encounter (Signed)
Patient requesting refill. 

## 2016-04-25 ENCOUNTER — Other Ambulatory Visit: Payer: Self-pay | Admitting: Family Medicine

## 2016-04-25 MED ORDER — POTASSIUM CHLORIDE ER 10 MEQ PO TBCR: EXTENDED_RELEASE_TABLET | ORAL | Status: DC

## 2016-04-25 NOTE — Telephone Encounter (Signed)
Patient is requesting a 90 day supply  Refill request was sent to Dr. Alba CoryKrichna Sowles for approval and submission.

## 2016-04-25 NOTE — Telephone Encounter (Signed)
Pt states she needs a 90 day supply for Potassium to be sent to medication management. Pt states if she is not getting 90 day supply for a reason for someone to call her and explain.

## 2016-05-15 ENCOUNTER — Other Ambulatory Visit: Payer: Self-pay | Admitting: Family Medicine

## 2016-05-15 NOTE — Telephone Encounter (Signed)
Patient requesting refill of Pot[facility] to [facility].

## 2016-05-19 ENCOUNTER — Ambulatory Visit: Payer: Self-pay | Admitting: Family Medicine

## 2016-06-06 ENCOUNTER — Other Ambulatory Visit: Payer: Self-pay | Admitting: Family Medicine

## 2016-06-06 DIAGNOSIS — F411 Generalized anxiety disorder: Secondary | ICD-10-CM

## 2016-06-06 NOTE — Telephone Encounter (Signed)
Last seen and filled 02/15/16

## 2016-07-16 ENCOUNTER — Telehealth: Payer: Self-pay | Admitting: Family Medicine

## 2016-07-16 NOTE — Telephone Encounter (Signed)
Pt would like to know if she can have enough Alprazolam until her appt next week 07/24/2016. Please advise.

## 2016-07-16 NOTE — Telephone Encounter (Signed)
Called in 9 pills to [facility] and explained to patient that Dr. Carlynn PurlSowles would discuss the Extended Release during her visit due to quantity patient is using.

## 2016-07-16 NOTE — Telephone Encounter (Signed)
Please call in for one a day until follow up, and explain to her that I usually don't fill it early. Please also explain that we will try to change to ER formulation or decrease amount of pills

## 2016-07-24 ENCOUNTER — Ambulatory Visit (INDEPENDENT_AMBULATORY_CARE_PROVIDER_SITE_OTHER): Payer: Self-pay | Admitting: Family Medicine

## 2016-07-24 ENCOUNTER — Encounter: Payer: Self-pay | Admitting: Family Medicine

## 2016-07-24 VITALS — BP 136/80 | HR 93 | Temp 98.6°F | Resp 16 | Ht 62.0 in | Wt 134.6 lb

## 2016-07-24 DIAGNOSIS — J410 Simple chronic bronchitis: Secondary | ICD-10-CM

## 2016-07-24 DIAGNOSIS — K5909 Other constipation: Secondary | ICD-10-CM

## 2016-07-24 DIAGNOSIS — R600 Localized edema: Secondary | ICD-10-CM

## 2016-07-24 DIAGNOSIS — F411 Generalized anxiety disorder: Secondary | ICD-10-CM

## 2016-07-24 MED ORDER — POTASSIUM CHLORIDE ER 10 MEQ PO TBCR: EXTENDED_RELEASE_TABLET | ORAL | 0 refills | Status: DC

## 2016-07-24 MED ORDER — TIOTROPIUM BROMIDE MONOHYDRATE 18 MCG IN CAPS: 18.0000 ug | ORAL_CAPSULE | Freq: Every day | RESPIRATORY_TRACT | 1 refills | Status: DC

## 2016-07-24 MED ORDER — TRIAMTERENE-HCTZ 37.5-25 MG PO TABS: 1.0000 | ORAL_TABLET | Freq: Every day | ORAL | 1 refills | Status: DC

## 2016-07-24 MED ORDER — ALPRAZOLAM 0.5 MG PO TABS: 0.5000 mg | ORAL_TABLET | Freq: Two times a day (BID) | ORAL | 2 refills | Status: DC | PRN

## 2016-07-24 NOTE — Progress Notes (Signed)
Name: Kristine Lowe   MRN: [id number]    DOB: 11/18/1955   Date:07/24/2016       Progress Note  Subjective  Chief Complaint  Chief Complaint  Patient presents with  . Anxiety    pt has not been on medication regularly and has been feeling jittery,sweaty, increased heart rate   . Medication Refill    HPI  Lower extremity edema: She feels light headed in am's - usually when she gets up in am, at home bp is 130's/70's. No ringing in ears or hearing loss. She denies association with anxiety or when she takes medication for edema. It is random. Last episode about one month ago. Symptoms improves with Meclizine, advised to stop that and drink water instead, she states she can't drink water because it makes her swell up, so she can only drink juice or soda ( explained the importance of water)  GAD: she has a long history of anxiety, she has been taking Alprazolam 0.5 mg up to two times daily to control her symptoms. She states that Zoloft cost her to shop lift in the past and is afraid of taking SSRI. She has occasional panic attacks. Worse symptom is when she first wakes up. She started on BZD at age 60, states has a stressful job and takes care of her mother. Discussed risk associated with BZD and need to wean off and try SSRI or see psychiatrist. She states she can't afford seeing psychiatrist, so we will try to wean her off medication   Chronic Bronchitis: she smoked for many years, she quit /switched to vapor in 2014 . She has been taking Spiriva ( gets it through [facility] ), no cough, she has wheezing very seldom during Spring.No SOB.  Hyperlipidemia: she is no longer taking Atorvastatin, she has been unable to afford labs because she does not have any insurance  Chronic constipation : she has been taking Amitiza prn , able to have daily bowel, and no blood in stools at this time  Patient Active Problem List   Diagnosis Date Noted  . Avitaminosis D 02/15/2016  . Obstructive apnea  02/15/2016  . Blood glucose elevated 02/15/2016  . Abnormal LFTs 02/15/2016  . Dermatitis, eczematoid 02/15/2016  . Generalized anxiety disorder 10/11/2015  . Simple chronic bronchitis (HCC) 10/11/2015  . Chronic back pain 10/11/2015  . Chronic constipation 10/11/2015  . Hyperlipidemia 10/11/2015  . Reflux esophagitis 05/01/2007    Past Surgical History:  Procedure Laterality Date  . BREAST EXCISIONAL BIOPSY Left 1996   neg    Family History  Problem Relation Age of Onset  . Breast cancer Mother 6050  . Breast cancer Maternal Aunt 60  . Breast cancer Paternal Grandmother     Social History   Social History  . Marital status: Married    Spouse name: N/A  . Number of children: N/A  . Years of education: N/A   Occupational History  . Not on file.   Social History Main Topics  . Smoking status: Never Smoker  . Smokeless tobacco: Never Used  . Alcohol use No  . Drug use: No  . Sexual activity: Not Currently   Other Topics Concern  . Not on file   Social History Narrative  . No narrative on file     Current Outpatient Prescriptions:  .  albuterol (PROAIR HFA) 108 (90 Base) MCG/ACT inhaler, Inhale into the lungs., Disp: , Rfl:  .  ALPRAZolam (XANAX) 0.5 MG tablet, Take 1 tablet (0.5 mg  total) by mouth 2 (two) times daily as needed for anxiety (return to office for next RF)., Disp: 45 tablet, Rfl: 2 .  cyclobenzaprine (FLEXERIL) 10 MG tablet, Take by mouth., Disp: , Rfl:  .  fluticasone (FLONASE) 50 MCG/ACT nasal spray, Place 2 sprays into both nostrils daily., Disp: 16 g, Rfl: 0 .  lubiprostone (AMITIZA) 8 MCG capsule, Take 1 capsule (8 mcg total) by mouth 2 (two) times daily with a meal., Disp: 60 capsule, Rfl: 3 .  potassium chloride (K-DUR) 10 MEQ tablet, Take 2 tablets (20 mEq total) by mouth daily., Disp: 90 tablet, Rfl: 0 .  tiotropium (SPIRIVA) 18 MCG inhalation capsule, Place 1 capsule (18 mcg total) into inhaler and inhale daily., Disp: 90 capsule, Rfl: 1 .   triamterene-hydrochlorothiazide (MAXZIDE-25) 37.5-25 MG tablet, Take 1 tablet by mouth daily., Disp: 90 tablet, Rfl: 1  Allergies  Allergen Reactions  . Penicillins Swelling  . Simvastatin   . Zoloft [Sertraline Hcl] Other (See Comments)    Shop lift     ROS  Constitutional: Negative for fever, positive  weight change.  Respiratory: Negative for cough and shortness of breath.   Cardiovascular: Negative for chest pain or palpitations.  Gastrointestinal: Negative for abdominal pain, no bowel changes.  Musculoskeletal: Negative for gait problem or joint swelling.  Skin: Negative for rash.  Neurological: Negative for dizziness or headache.  No other specific complaints in a complete review of systems (except as listed in HPI above).  Objective  Vitals:   07/24/16 1111  BP: 136/80  Pulse: 93  Resp: 16  Temp: 98.6 F (37 C)  TempSrc: Oral  SpO2: 97%  Weight: 134 lb 9 oz (61 kg)  Height: 5\' 2"  (1.575 m)    Body mass index is 24.61 kg/m.  Physical Exam  Constitutional: Patient appears well-developed and well-nourished. No distress.  HEENT: head atraumatic, normocephalic, pupils equal and reactive to light, neck supple, throat within normal limits Cardiovascular: Normal rate, regular rhythm and normal heart sounds.  No murmur heard. No BLE edema. Pulmonary/Chest: Effort normal and breath sounds normal. No respiratory distress. Abdominal: Soft. Non tender Psychiatric: Patient has a normal mood and affect. behavior is normal. Judgment and thought content normal.  PHQ2/9: Depression screen [facility] 2/9 07/24/2016 02/15/2016 10/11/2015  Decreased Interest 0 0 0  Down, Depressed, Hopeless 0 0 0  PHQ - 2 Score 0 0 0    Fall Risk: Fall Risk  07/24/2016 02/15/2016 10/11/2015  Falls in the past year? No No No     Functional Status Survey: Is the patient deaf or have difficulty hearing?: No Does the patient have difficulty seeing, even when wearing glasses/contacts?: No Does the  patient have difficulty concentrating, remembering, or making decisions?: No Does the patient have difficulty walking or climbing stairs?: Yes (vertigo ) Does the patient have difficulty dressing or bathing?: No Does the patient have difficulty doing errands alone such as visiting a doctor's office or shopping?: No    Assessment & Plan  1. Generalized anxiety disorder  Explained that we will have to decrease the amount of pills she gets, and that we should try SSRI or change to XR formulation, she refuses, therefore we will go from 60 to 45 daily but explained that we will gradually get her off medication secondary to risk associated with BZD's - ALPRAZolam (XANAX) 0.5 MG tablet; Take 1 tablet (0.5 mg total) by mouth 2 (two) times daily as needed for anxiety (return to office for next RF).  Dispense:  45 tablet; Refill: 2  2. Chronic constipation  Continue prn Amitiza  3. Simple chronic bronchitis (HCC)  - tiotropium (SPIRIVA) 18 MCG inhalation capsule; Place 1 capsule (18 mcg total) into inhaler and inhale daily.  Dispense: 90 capsule; Refill: 1  4. Lower extremity edema  Taking medication prn, can't afford medication, advised [facility] can have labs done by sliding scale - potassium chloride (K-DUR) 10 MEQ tablet; Take 2 tablets (20 mEq total) by mouth daily.  Dispense: 90 tablet; Refill: 0 - triamterene-hydrochlorothiazide (MAXZIDE-25) 37.5-25 MG tablet; Take 1 tablet by mouth daily.  Dispense: 90 tablet; Refill: 1

## 2016-08-07 ENCOUNTER — Other Ambulatory Visit: Payer: Self-pay | Admitting: Family Medicine

## 2016-08-07 DIAGNOSIS — J431 Panlobular emphysema: Secondary | ICD-10-CM

## 2016-08-07 NOTE — Telephone Encounter (Signed)
Patient requesting refill of Proair to medication management clinic.

## 2016-09-27 ENCOUNTER — Other Ambulatory Visit: Payer: Self-pay | Admitting: Family Medicine

## 2016-09-27 DIAGNOSIS — F411 Generalized anxiety disorder: Secondary | ICD-10-CM

## 2016-10-24 ENCOUNTER — Ambulatory Visit: Payer: Self-pay | Admitting: Family Medicine

## 2017-02-11 ENCOUNTER — Emergency Department
Admission: EM | Admit: 2017-02-11 | Discharge: 2017-02-11 | Disposition: A | Discharge status: Home / Self Care | Payer: Self-pay | Attending: Emergency Medicine | Admitting: Emergency Medicine

## 2017-02-11 DIAGNOSIS — F419 Anxiety disorder, unspecified: Secondary | ICD-10-CM | POA: Insufficient documentation

## 2017-02-11 DIAGNOSIS — J449 Chronic obstructive pulmonary disease, unspecified: Secondary | ICD-10-CM | POA: Insufficient documentation

## 2017-02-11 DIAGNOSIS — Z79899 Other long term (current) drug therapy: Secondary | ICD-10-CM | POA: Insufficient documentation

## 2017-02-11 NOTE — ED Triage Notes (Addendum)
Pt presents to ED via POV with c/o anxiety x2-3 days. Pt reports history of same. Pt reports she came to the ED tonight "because it's worse" but cannot elaborate. 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 Pt reports being out of anxiety meds for the last three months. Pt denies CP or SHOB. Pt also denies any thoughts of self-harm, SI, HI, or withdrawal.

## 2017-02-11 NOTE — ED Notes (Signed)
Patient reports that for about 3-4 months she has been out of her xanax. Her PCP wanted to take her off of it and try something different. Patient states that she takes 0.5mg  twice a day. Some days she may only do a half of a pill and some days she may not take any. Patient calm and cooperative. NAD noted at this time. 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 Patient is ready to go now at this time to get home and feed her mom. EDP aware.

## 2017-02-11 NOTE — Discharge Instructions (Signed)
Please seek medical attention for any high fevers, chest pain, shortness of breath, change in behavior, persistent vomiting, bloody stool or any other new or concerning symptoms.  

## 2017-02-11 NOTE — ED Provider Notes (Signed)
[facility] Emergency Department Provider Note _____________________________________   I have reviewed the triage vital signs and the nursing notes.   HISTORY  Chief Complaint Anxiety   History limited by: Not Limited   HPI Kristine Lowe is a 61 y.o. female who presents to the emergency department today because of concern for anxiety. Patient states she has had issues with anxiety since her twenties. The patient states that it got worse today. She had been on Alprazolam but has not taken it for three months. Her primary care physician had been wanting to wean her off of it but she did it herself. She has not seen her primary care doctor since then. The patient states that she is taking care of her mother who has alzheimer's however does not think that is necessarily the main trigger of her anxiety. She denies any recent illness.    Past Medical History:  Diagnosis Date  . Allergy   . Anxiety   . Chronic obstructive airway disease (HCC)   . Dizziness and giddiness   . Esophageal reflux   . Insomnia   . Lumbago   . Lumbosacral neuritis   . Pure hypercholesterolemia     Patient Active Problem List   Diagnosis Date Noted  . Avitaminosis D 02/15/2016  . Obstructive apnea 02/15/2016  . Blood glucose elevated 02/15/2016  . Abnormal LFTs 02/15/2016  . Dermatitis, eczematoid 02/15/2016  . Generalized anxiety disorder 10/11/2015  . Simple chronic bronchitis (HCC) 10/11/2015  . Chronic back pain 10/11/2015  . Chronic constipation 10/11/2015  . Reflux esophagitis 05/01/2007    Past Surgical History:  Procedure Laterality Date  . BREAST EXCISIONAL BIOPSY Left 1996   neg    Prior to Admission medications   Medication Sig Start Date End Date Taking? Authorizing Provider  ALPRAZolam Prudy Feeler(XANAX) 0.5 MG tablet Take 1 tablet (0.5 mg total) by mouth 2 (two) times daily as needed for anxiety (return to office for next RF). 07/24/16   Alba CorySowles, Krichna, MD   cyclobenzaprine (FLEXERIL) 10 MG tablet Take by mouth.    [provider]  fluticasone (FLONASE) 50 MCG/ACT nasal spray Place 2 sprays into both nostrils daily. 02/11/16   Dennison MascotMorrisey, Lem[place], MD  lubiprostone (AMITIZA) 8 MCG capsule Take 1 capsule (8 mcg total) by mouth 2 (two) times daily with a meal. 02/11/16   Dennison MascotMorrisey, Lemont, MD  potassium chloride (K-DUR) 10 MEQ tablet Take 2 tablets (20 mEq total) by mouth daily. 07/24/16   Alba CorySowles, Krichna, MD  PROAIR HFA 108 [phone](90 Base) MCG/ACT inhaler INHALE 2 PUFFS FOUR TIMES A DAY 08/08/16   Alba CorySowles, Krichna, MD  tiotropium (SPIRIVA) 18 MCG inhalation capsule Place 1 capsule (18 mcg total) into inhaler and inhale daily. 07/24/16   Alba CorySowles, Krichna, MD  triamterene-hydrochlorothiazide (MAXZIDE-25) 37.5-25 MG tablet Take 1 tablet by mouth daily. 07/24/16   Alba CorySowles, Krichna, MD    Allergies Penicillins; Simvastatin; and Zoloft [sertraline hcl]  Family History  Problem Relation Age of Onset  . Breast cancer Mother 7950  . Breast cancer Maternal Aunt 60  . Breast cancer Paternal Grandmother     Social History Social History  Substance Use Topics  . Smoking status: Never Smoker  . Smokeless tobacco: Never Used  . Alcohol use No    Review of Systems Constitutional: No fever/chills Cardiovascular: Denies chest pain. Respiratory: Denies shortness of breath. Gastrointestinal: No abdominal pain.  Neurological: Negative for headaches Psychiatric: Positive for anxiety. ____________________________________________   PHYSICAL EXAM:  VITAL SIGNS: ED Triage  Vitals  Enc Vitals Group     BP 02/11/17 1922 (!) 168/94     Pulse Rate 02/11/17 1922 (!) 101     Resp 02/11/17 1922 16     Temp 02/11/17 1922 98.6 F (37 C)     Temp Source 02/11/17 1922 Oral     SpO2 02/11/17 1922 98 %     Weight 02/11/17 1923 127 lb (57.6 kg)     Height 02/11/17 1923 5\' 3"  (1.6 m)     Head Circumference --      Peak Flow --      Pain Score 02/11/17 1922 0    Constitutional: Alert and oriented. Well appearing and in no distress. Eyes: Conjunctivae are normal. Normal extraocular movements. ENT   Head: Normocephalic and atraumatic.   Nose: No congestion/rhinnorhea.   Mouth/Throat: Mucous membranes are moist.   Neck: No stridor. Respiratory: Normal respiratory effort without tachypnea nor retractions. Breath sounds are clear and equal bilaterally. No wheezes/rales/rhonchi. Genitourinary: Deferred Musculoskeletal: Normal range of motion in all extremities. Neurologic:  Normal speech and language. No gross focal neurologic deficits are appreciated.  Skin:  Skin is warm, dry and intact. No rash noted. Psychiatric: Mood and affect are normal. Speech and behavior are normal. Patient exhibits appropriate insight and judgment.  ____________________________________________    LABS (pertinent positives/negatives)  None  ____________________________________________   EKG  None  ____________________________________________    RADIOLOGY  None  ____________________________________________   PROCEDURES  Procedures  ____________________________________________   INITIAL IMPRESSION / ASSESSMENT AND PLAN / ED COURSE  Pertinent labs & imaging results that were available during my care of the patient were reviewed by me and considered in my medical decision making (see chart for details).  Patient presented to the emergency department today because of concerns for anxiety. Unfortunately the patient also had concerns and her mother was home alone. I did offer to give the patient up and so here in the emergency department however discuss with the patient that I would like to watch her for a while since she would be driving. She stated she had known that could come pick her up. I discussed that I did not feel comfortable giving her been so and then having her driving immediately. I did discuss with the patient that it is a  sedating medication. Furthermore I do not feel comfortable given a prescription for a controlled substance, especially when her primary care doctor had mentioned she wanted to wean the patient off of it. Did discuss with the patient has she should follow-up with her primary care physician.  ____________________________________________   FINAL CLINICAL IMPRESSION(S) / ED DIAGNOSES  Final diagnoses:  Anxiety     Note: This dictation was prepared with Dragon dictation. Any transcriptional errors that result from this process are unintentional     Phineas Semen, MD 02/11/17 2053

## 2017-02-11 NOTE — ED Notes (Signed)
Patient discharged upset because she was not given a prescription for xanax. EDP offered to give her medication here but would have to stay with us the allotted time after adminstration. Patient was not ok with this because her mom is home alone with alzheimers and she needed to get home to her. Patient tearful and short with registration. Did tell this RN to apologize to her.

## 2017-02-13 ENCOUNTER — Encounter: Payer: Self-pay | Admitting: Family Medicine

## 2017-02-13 ENCOUNTER — Ambulatory Visit (INDEPENDENT_AMBULATORY_CARE_PROVIDER_SITE_OTHER): Payer: Self-pay | Admitting: Family Medicine

## 2017-02-13 VITALS — BP 128/74 | HR 107 | Temp 97.3°F | Resp 16 | Ht 62.0 in | Wt 132.7 lb

## 2017-02-13 DIAGNOSIS — J431 Panlobular emphysema: Secondary | ICD-10-CM

## 2017-02-13 DIAGNOSIS — Z8632 Personal history of gestational diabetes: Secondary | ICD-10-CM

## 2017-02-13 DIAGNOSIS — K581 Irritable bowel syndrome with constipation: Secondary | ICD-10-CM

## 2017-02-13 DIAGNOSIS — Z1211 Encounter for screening for malignant neoplasm of colon: Secondary | ICD-10-CM

## 2017-02-13 DIAGNOSIS — J410 Simple chronic bronchitis: Secondary | ICD-10-CM

## 2017-02-13 DIAGNOSIS — J069 Acute upper respiratory infection, unspecified: Secondary | ICD-10-CM

## 2017-02-13 DIAGNOSIS — R600 Localized edema: Secondary | ICD-10-CM

## 2017-02-13 DIAGNOSIS — E559 Vitamin D deficiency, unspecified: Secondary | ICD-10-CM

## 2017-02-13 DIAGNOSIS — R739 Hyperglycemia, unspecified: Secondary | ICD-10-CM

## 2017-02-13 DIAGNOSIS — F411 Generalized anxiety disorder: Secondary | ICD-10-CM

## 2017-02-13 DIAGNOSIS — R945 Abnormal results of liver function studies: Secondary | ICD-10-CM

## 2017-02-13 DIAGNOSIS — R7989 Other specified abnormal findings of blood chemistry: Secondary | ICD-10-CM

## 2017-02-13 DIAGNOSIS — Z13220 Encounter for screening for lipoid disorders: Secondary | ICD-10-CM

## 2017-02-13 MED ORDER — ALPRAZOLAM 0.5 MG PO TABS: 0.5000 mg | ORAL_TABLET | Freq: Two times a day (BID) | ORAL | 0 refills | Status: DC | PRN

## 2017-02-13 MED ORDER — VITAMIN D 50 MCG (2000 UT) PO CAPS: 1.0000 | ORAL_CAPSULE | Freq: Every day | ORAL | 0 refills | Status: DC

## 2017-02-13 MED ORDER — FLUTICASONE PROPIONATE 50 MCG/ACT NA SUSP: 2.0000 | Freq: Every day | NASAL | 0 refills | Status: DC

## 2017-02-13 MED ORDER — TRIAMTERENE-HCTZ 37.5-25 MG PO TABS: 0.5000 | ORAL_TABLET | Freq: Every day | ORAL | 1 refills | Status: DC

## 2017-02-13 MED ORDER — LUBIPROSTONE 8 MCG PO CAPS: 8.0000 ug | ORAL_CAPSULE | Freq: Two times a day (BID) | ORAL | 3 refills | Status: DC

## 2017-02-13 MED ORDER — CITALOPRAM HYDROBROMIDE 20 MG PO TABS: 20.0000 mg | ORAL_TABLET | Freq: Every day | ORAL | 1 refills | Status: DC

## 2017-02-13 MED ORDER — DM-GUAIFENESIN ER 30-600 MG PO TB12: 1.0000 | ORAL_TABLET | Freq: Two times a day (BID) | ORAL | 0 refills | Status: DC

## 2017-02-13 MED ORDER — UMECLIDINIUM-VILANTEROL 62.5-25 MCG/INH IN AEPB: 1.0000 | INHALATION_SPRAY | Freq: Every day | RESPIRATORY_TRACT | 3 refills | Status: DC

## 2017-02-13 NOTE — Addendum Note (Signed)
Addended by: Alba CorySOWLES, Armondo Cech F on: 02/13/2017 03:35 PM   Modules accepted: Orders

## 2017-02-13 NOTE — Progress Notes (Addendum)
Name: Kristine Lowe   MRN: [id number]    DOB: 04/26/1956   Date:02/13/2017       Progress Note  Subjective  Chief Complaint  Chief Complaint  Patient presents with  . Hospitalization Follow-up    Went in for a anxiety attacks, has had severe attacks. Takes care of her mother and was unable to sit around for the medication  . Anxiety  . URI    Onset- couple of days, Patient states she has had nasal drainage, stuffy nose, headaches, sinus pain. Muscle tightness    HPI  Lower extremity edema: BP is well controlled, out of Triamterene HCTZ for edema, advised her to take half dose because bp is towards low end of normal without medication. Denies orthopnea.  URI: she states that for the past 2 days she has noticed nasal congestion, post-nasal drainage, facial pressure, and headache. No fever or chills. No change in appetite  GAD: she has a long history of anxiety, she was taking Alprazolam 0.5 mg up to two times daily to control her symptoms. She states that Zoloft caused  her to shop lift in the past and is afraid of taking SSRI. She has occasional panic attacks, and [facility]. Her last rx of alprazolam was given 07/2016 and she has been out of medication for the past few months. . Worse symptom is when she first wakes up. She started on BZD at age 61, states has a stressful job and takes care of her mother, who has Alzheimer's. She is willing to try Celexa today.   Chronic Bronchitis: she smoked for many years, she quit /switched to vapor in 2014 . She has been taking Spiriva ( gets it through [employer] ), she has noticed some cough from post-nasal drainage, she still has some wheezing when she goes outdoors when humidity is high.   Hyperlipidemia: she is no longer taking Atorvastatin, she has been unable to afford labs because she does not have any insurance.   IBS with  constipation : she has been taking Amitiza prn , able to have daily bowel,  and no blood in stools at this time, advised to try taking it daily    Patient Active Problem List   Diagnosis Date Noted  . History of gestational diabetes 02/13/2017  . Avitaminosis D 02/15/2016  . Obstructive apnea 02/15/2016  . Blood glucose elevated 02/15/2016  . Abnormal LFTs 02/15/2016  . Dermatitis, eczematoid 02/15/2016  . Generalized anxiety disorder 10/11/2015  . Simple chronic bronchitis (HCC) 10/11/2015  . Chronic back pain 10/11/2015  . Chronic constipation 10/11/2015  . Reflux esophagitis 05/01/2007    Past Surgical History:  Procedure Laterality Date  . BREAST EXCISIONAL BIOPSY Left 1996   neg    Family History  Problem Relation Age of Onset  . Breast cancer Mother 5450  . Breast cancer Maternal Aunt 60  . Breast cancer Paternal Grandmother     Social History   Social History  . Marital status: Married    Spouse name: N/A  . Number of children: N/A  . Years of education: N/A   Occupational History  . Not on file.   Social History Main Topics  . Smoking status: Never Smoker  . Smokeless tobacco: Never Used  . Alcohol use No  . Drug use: No  . Sexual activity: Not Currently   Other Topics Concern  . Not on file   Social History Narrative  . No narrative  on file     Current Outpatient Prescriptions:  .  ALPRAZolam (XANAX) 0.5 MG tablet, Take 1 tablet (0.5 mg total) by mouth 2 (two) times daily as needed for anxiety (return to office for next RF)., Disp: 30 tablet, Rfl: 0 .  cyclobenzaprine (FLEXERIL) 10 MG tablet, Take 10 mg by mouth as needed. , Disp: , Rfl:  .  fluticasone (FLONASE) 50 MCG/ACT nasal spray, Place 2 sprays into both nostrils daily., Disp: 16 g, Rfl: 0 .  lubiprostone (AMITIZA) 8 MCG capsule, Take 1 capsule (8 mcg total) by mouth 2 (two) times daily with a meal., Disp: 90 capsule, Rfl: 3 .  PROAIR HFA 108 (90 Base) MCG/ACT inhaler, INHALE 2 PUFFS FOUR TIMES A DAY, Disp: 34 g, Rfl: 0 .  triamterene-hydrochlorothiazide  (MAXZIDE-25) 37.5-25 MG tablet, Take 0.5 tablets by mouth daily., Disp: 45 tablet, Rfl: 1 .  Cholecalciferol (VITAMIN D) 2000 units CAPS, Take 1 capsule (2,000 Units total) by mouth daily., Disp: 30 capsule, Rfl: 0 .  citalopram (CELEXA) 20 MG tablet, Take 1 tablet (20 mg total) by mouth daily., Disp: 90 tablet, Rfl: 1 .  umeclidinium-vilanterol (ANORO ELLIPTA) 62.5-25 MCG/INH AEPB, Inhale 1 puff into the lungs daily., Disp: 180 each, Rfl: 3  Allergies  Allergen Reactions  . Penicillins Swelling  . Simvastatin   . Zoloft [Sertraline Hcl] Other (See Comments)    Shop lift     ROS  Constitutional: Negative for fever or weight change.  Respiratory: Negative for cough and shortness of breath.   Cardiovascular: Negative for chest pain or palpitations.  Gastrointestinal: Negative for abdominal pain, no bowel changes.  Musculoskeletal: Negative for gait problem or joint swelling.  Skin: Negative for rash.  Neurological: Negative for dizziness or headache.  No other specific complaints in a complete review of systems (except as listed in HPI above).  Objective  Vitals:   02/13/17 1450  BP: 128/74  Pulse: (!) 107  Resp: 16  Temp: 97.3 F (36.3 C)  TempSrc: Oral  SpO2: 95%  Weight: 132 lb 11.2 oz (60.2 kg)  Height: 5\' 2"  (1.575 m)    Body mass index is 24.27 kg/m.  Physical Exam  Constitutional: Patient appears well-developed and well-nourished.  No distress.  HEENT: head atraumatic, normocephalic, pupils equal and reactive to light,  neck supple, throat within normal limits, mild pain on maxillary sinus bilaterally.   Cardiovascular: Normal rate, regular rhythm and normal heart sounds.  No murmur heard. No BLE edema. Pulmonary/Chest: Effort normal and breath sounds - rhonchi scattered bilaterally. No respiratory distress. Abdominal: Soft.  There is no tenderness. Psychiatric: Patient has a normal mood and affect. behavior is normal. Judgment and thought content  normal.  PHQ2/9: Depression screen [facility] 2/9 02/13/2017 07/24/2016 02/15/2016 10/11/2015  Decreased Interest 0 0 0 0  Down, Depressed, Hopeless 0 0 0 0  PHQ - 2 Score 0 0 0 0     Fall Risk: Fall Risk  02/13/2017 07/24/2016 02/15/2016 10/11/2015  Falls in the past year? No No No No     Functional Status Survey: Is the patient deaf or have difficulty hearing?: No Does the patient have difficulty seeing, even when wearing glasses/contacts?: No Does the patient have difficulty concentrating, remembering, or making decisions?: No Does the patient have difficulty walking or climbing stairs?: No Does the patient have difficulty dressing or bathing?: No Does the patient have difficulty doing errands alone such as visiting a doctor's office or shopping?: No    Assessment & Plan  1. Generalized anxiety disorder  - ALPRAZolam (XANAX) 0.5 MG tablet; Take 1 tablet (0.5 mg total) by mouth 2 (two) times daily as needed for anxiety (return to office for next RF).  Dispense: 30 tablet; Refill: 0 - citalopram (CELEXA) 20 MG tablet; Take 1 tablet (20 mg total) by mouth daily.  Dispense: 90 tablet; Refill: 1  2. Simple chronic bronchitis (HCC)  - umeclidinium-vilanterol (ANORO ELLIPTA) 62.5-25 MCG/INH AEPB; Inhale 1 puff into the lungs daily.  Dispense: 180 each; Refill: 3  3. Panlobular emphysema (HCC)  - umeclidinium-vilanterol (ANORO ELLIPTA) 62.5-25 MCG/INH AEPB; Inhale 1 puff into the lungs daily.  Dispense: 180 each; Refill: 3  4. Irritable bowel syndrome with constipation  - lubiprostone (AMITIZA) 8 MCG capsule; Take 1 capsule (8 mcg total) by mouth 2 (two) times daily with a meal.  Dispense: 90 capsule; Refill: 3 - COMPLETE METABOLIC PANEL WITH GFR  5. Blood glucose elevated  - COMPLETE METABOLIC PANEL WITH GFR  6. Abnormal LFTs  Needs labs  7. Avitaminosis D  Resume supplementation  - COMPLETE METABOLIC PANEL WITH GFR - Cholecalciferol (VITAMIN D) 2000 units CAPS; Take 1 capsule  (2,000 Units total) by mouth daily.  Dispense: 30 capsule; Refill: 0  8. Lower extremity edema  - triamterene-hydrochlorothiazide (MAXZIDE-25) 37.5-25 MG tablet; Take 0.5 tablets by mouth daily.  Dispense: 45 tablet; Refill: 1  9. History of gestational diabetes  - Hemoglobin A1c  10. Lipid screening  - Lipid panel  12. Colon cancer screening  - POC Hemoccult Bld/Stl (3-Cd Home Screen); Future   11. Viral upper respiratory tract infection  - dextromethorphan-guaiFENesin (MUCINEX DM) 30-600 MG 12hr tablet; Take 1 tablet by mouth 2 (two) times daily.  Dispense: 10 tablet; Refill: 0 - fluticasone (FLONASE) 50 MCG/ACT nasal spray; Place 2 sprays into both nostrils daily.  Dispense: 16 g; Refill: 0  12. Colon cancer screening  - POC Hemoccult Bld/Stl (3-Cd Home Screen); Future

## 2017-02-17 ENCOUNTER — Other Ambulatory Visit: Payer: Self-pay

## 2017-02-17 DIAGNOSIS — J431 Panlobular emphysema: Secondary | ICD-10-CM

## 2017-02-17 DIAGNOSIS — J410 Simple chronic bronchitis: Secondary | ICD-10-CM

## 2017-02-17 MED ORDER — UMECLIDINIUM-VILANTEROL 62.5-25 MCG/INH IN AEPB: 1.0000 | INHALATION_SPRAY | Freq: Every day | RESPIRATORY_TRACT | 3 refills | Status: DC

## 2017-03-26 ENCOUNTER — Encounter: Payer: Self-pay | Admitting: Pharmacist

## 2017-04-02 ENCOUNTER — Encounter: Payer: Self-pay | Admitting: Pharmacist

## 2017-06-11 ENCOUNTER — Telehealth: Payer: Self-pay | Admitting: Pharmacy Technician

## 2017-06-11 NOTE — Telephone Encounter (Signed)
Patient failed to provide 2018 poi.  No additional medication assistance will be provided by [facility] without the required proof of income documentation.  Patient notified by letter.  Kristine Lowe J. Anyra Kaufman Care Manager [facility] 

## 2019-04-18 ENCOUNTER — Encounter: Payer: Self-pay | Admitting: Family Medicine

## 2019-04-22 ENCOUNTER — Ambulatory Visit (INDEPENDENT_AMBULATORY_CARE_PROVIDER_SITE_OTHER): Payer: Medicare PPO | Admitting: Family Medicine

## 2019-04-22 ENCOUNTER — Encounter: Payer: Self-pay | Admitting: Family Medicine

## 2019-04-22 ENCOUNTER — Other Ambulatory Visit: Payer: Self-pay

## 2019-04-22 VITALS — BP 140/90 | HR 79 | Temp 97.7°F | Resp 16 | Ht 60.0 in | Wt 130.9 lb

## 2019-04-22 DIAGNOSIS — J410 Simple chronic bronchitis: Secondary | ICD-10-CM

## 2019-04-22 DIAGNOSIS — Z8632 Personal history of gestational diabetes: Secondary | ICD-10-CM

## 2019-04-22 DIAGNOSIS — Z23 Encounter for immunization: Secondary | ICD-10-CM

## 2019-04-22 DIAGNOSIS — Z780 Asymptomatic menopausal state: Secondary | ICD-10-CM

## 2019-04-22 DIAGNOSIS — E559 Vitamin D deficiency, unspecified: Secondary | ICD-10-CM

## 2019-04-22 DIAGNOSIS — R739 Hyperglycemia, unspecified: Secondary | ICD-10-CM

## 2019-04-22 DIAGNOSIS — R42 Dizziness and giddiness: Secondary | ICD-10-CM

## 2019-04-22 DIAGNOSIS — R0981 Nasal congestion: Secondary | ICD-10-CM

## 2019-04-22 DIAGNOSIS — G4733 Obstructive sleep apnea (adult) (pediatric): Secondary | ICD-10-CM

## 2019-04-22 DIAGNOSIS — F419 Anxiety disorder, unspecified: Secondary | ICD-10-CM

## 2019-04-22 DIAGNOSIS — Z1159 Encounter for screening for other viral diseases: Secondary | ICD-10-CM

## 2019-04-22 DIAGNOSIS — I10 Essential (primary) hypertension: Secondary | ICD-10-CM

## 2019-04-22 DIAGNOSIS — Z1231 Encounter for screening mammogram for malignant neoplasm of breast: Secondary | ICD-10-CM

## 2019-04-22 DIAGNOSIS — Z0000 Encounter for general adult medical examination without abnormal findings: Secondary | ICD-10-CM

## 2019-04-22 DIAGNOSIS — Z1211 Encounter for screening for malignant neoplasm of colon: Secondary | ICD-10-CM

## 2019-04-22 MED ORDER — ALBUTEROL SULFATE HFA 108 (90 BASE) MCG/ACT IN AERS: 2.0000 | INHALATION_SPRAY | Freq: Four times a day (QID) | RESPIRATORY_TRACT | 0 refills | Status: DC | PRN

## 2019-04-22 MED ORDER — FLUTICASONE PROPIONATE 50 MCG/ACT NA SUSP: 2.0000 | Freq: Every day | NASAL | 2 refills | Status: DC

## 2019-04-22 MED ORDER — HYDROCHLOROTHIAZIDE 12.5 MG PO TABS: 12.5000 mg | ORAL_TABLET | Freq: Every day | ORAL | 1 refills | Status: DC

## 2019-04-22 MED ORDER — MECLIZINE HCL 12.5 MG PO TABS: 12.5000 mg | ORAL_TABLET | Freq: Three times a day (TID) | ORAL | 0 refills | Status: DC | PRN

## 2019-04-22 MED ORDER — HYDROXYZINE HCL 10 MG PO TABS: 10.0000 mg | ORAL_TABLET | Freq: Three times a day (TID) | ORAL | 0 refills | Status: DC | PRN

## 2019-04-22 NOTE — Patient Instructions (Signed)
Preventive Care 38 Years and Older, Female Preventive care refers to lifestyle choices and visits with your health care provider that can promote health and wellness. This includes:  A yearly physical exam. This is also called an annual well check.  Regular dental and eye exams.  Immunizations.  Screening for certain conditions.  Healthy lifestyle choices, such as diet and exercise. What can I expect for my preventive care visit? Physical exam Your health care provider will check:  Height and weight. These may be used to calculate body mass index (BMI), which is a measurement that tells if you are at a healthy weight.  Heart rate and blood pressure.  Your skin for abnormal spots. Counseling Your health care provider may ask you questions about:  Alcohol, tobacco, and drug use.  Emotional well-being.  Home and relationship well-being.  Sexual activity.  Eating habits.  History of falls.  Memory and ability to understand (cognition).  Work and work Statistician.  Pregnancy and menstrual history. What immunizations do I need?  Influenza (flu) vaccine  This is recommended every year. Tetanus, diphtheria, and pertussis (Tdap) vaccine  You may need a Td booster every 10 years. Varicella (chickenpox) vaccine  You may need this vaccine if you have not already been vaccinated. Zoster (shingles) vaccine  You may need this after age 33. Pneumococcal conjugate (PCV13) vaccine  One dose is recommended after age 33. Pneumococcal polysaccharide (PPSV23) vaccine  One dose is recommended after age 72. Measles, mumps, and rubella (MMR) vaccine  You may need at least one dose of MMR if you were born in 1957 or later. You may also need a second dose. Meningococcal conjugate (MenACWY) vaccine  You may need this if you have certain conditions. Hepatitis A vaccine  You may need this if you have certain conditions or if you travel or work in places where you may be exposed  to hepatitis A. Hepatitis B vaccine  You may need this if you have certain conditions or if you travel or work in places where you may be exposed to hepatitis B. Haemophilus influenzae type b (Hib) vaccine  You may need this if you have certain conditions. You may receive vaccines as individual doses or as more than one vaccine together in one shot (combination vaccines). Talk with your health care provider about the risks and benefits of combination vaccines. What tests do I need? Blood tests  Lipid and cholesterol levels. These may be checked every 5 years, or more frequently depending on your overall health.  Hepatitis C test.  Hepatitis B test. Screening  Lung cancer screening. You may have this screening every year starting at age 39 if you have a 30-pack-year history of smoking and currently smoke or have quit within the past 15 years.  Colorectal cancer screening. All adults should have this screening starting at age 36 and continuing until age 15. Your health care provider may recommend screening at age 23 if you are at increased risk. You will have tests every 1-10 years, depending on your results and the type of screening test.  Diabetes screening. This is done by checking your blood sugar (glucose) after you have not eaten for a while (fasting). You may have this done every 1-3 years.  Mammogram. This may be done every 1-2 years. Talk with your health care provider about how often you should have regular mammograms.  BRCA-related cancer screening. This may be done if you have a family history of breast, ovarian, tubal, or peritoneal cancers.  Other tests  Sexually transmitted disease (STD) testing.  Bone density scan. This is done to screen for osteoporosis. You may have this done starting at age 55. Follow these instructions at home: Eating and drinking  Eat a diet that includes fresh fruits and vegetables, whole grains, lean protein, and low-fat dairy products. Limit  your intake of foods with high amounts of sugar, saturated fats, and salt.  Take vitamin and mineral supplements as recommended by your health care provider.  Do not drink alcohol if your health care provider tells you not to drink.  If you drink alcohol: ? Limit how much you have to 0-1 drink a day. ? Be aware of how much alcohol is in your drink. In the [place] one drink equals one 12 oz bottle of beer (355 mL), one 5 oz glass of wine (148 mL), or one 1 oz glass of hard liquor (44 mL). Lifestyle  Take daily care of your teeth and gums.  Stay active. Exercise for at least 30 minutes on 5 or more days each week.  Do not use any products that contain nicotine or tobacco, such as cigarettes, e-cigarettes, and chewing tobacco. If you need help quitting, ask your health care provider.  If you are sexually active, practice safe sex. Use a condom or other form of protection in order to prevent STIs (sexually transmitted infections).  Talk with your health care provider about taking a low-dose aspirin or statin. What's next?  Go to your health care provider once a year for a well check visit.  Ask your health care provider how often you should have your eyes and teeth checked.  Stay up to date on all vaccines. This information is not intended to replace advice given to you by your health care provider. Make sure you discuss any questions you have with your health care provider. Document Released: 10/19/2015 Document Revised: 09/16/2018 Document Reviewed: 09/16/2018 Elsevier Patient Education  2020 [employer].

## 2019-04-22 NOTE — Progress Notes (Signed)
Patient: Kristine Lowe, Female    DOB: [birth year], 63 y.o.   MRN: [id number]  Visit Date: 04/22/2019  Today's Provider: Loistine Chance, MD   Chief Complaint  Patient presents with  . Medicare Wellness  . Anxiety    Subjective:    HPI Kristine Lowe is a 63 y.o. female who presents today for her Welcome to [facility] Wellness visit and follow up  Patient/Caregiver input:    Anxiety: she used to take Citalopram but prefers to take alprazolam prn, a few times a week, explain that we no longer recommend BZD but we can try hydroxizine  Intermittent vertigo: going on for years, she states sometimes she gets up and walks towards a wall she denies spinning sensation, takes meclizine prn and wants a refill. Not associated with nausea or vomiting.   HTN: she used to take triamterene hctz, we will try lower dose of medication down to hctz , no chest pain or palpitation, unless she is having anxiety attack  Chronic Bronchitis: she smoked for many years, she quit /switched to vapor in 2014, but when she ran out of alprazolam a couple of years ago she resumed smoking. She is smoking a quarter pack daily, no cough, wheezing or sob, discussed importance of quitting again, used to use Spiriva and after that Anoro but does not want to take daily medication   OSA: she never really likely wearing machine, discussed risk of pulmonary hypertension, she is not interested at this time  History of gestational diabetes: she has episodes of polyuria but denies polydipsia or polyuria   Review of Systems  Constitutional: Negative for fever or weight change.  Respiratory: Negative for cough and shortness of breath.   Cardiovascular: Negative for chest pain or palpitations.  Gastrointestinal: Negative for abdominal pain, no bowel changes.  Musculoskeletal: Negative for gait problem or joint swelling.  Skin: Negative for rash.  Neurological: Positive gor intermittent  dizziness but no  headache.   No other specific complaints in a complete review of systems (except as listed in HPI above).  Past Medical History:  Diagnosis Date  . Allergy   . Anxiety   . Chronic obstructive airway disease ([place])   . Dizziness and giddiness   . Esophageal reflux   . Insomnia   . Lumbago   . Lumbosacral neuritis   . Pure hypercholesterolemia     Past Surgical History:  Procedure Laterality Date  . BREAST EXCISIONAL BIOPSY Left 1996   neg    Family History  Problem Relation Age of Onset  . Breast cancer Mother 24  . Breast cancer Maternal Aunt 60  . Breast cancer Paternal Grandmother     Social History   Socioeconomic History  . Marital status: Married    Spouse name: Not on file  . Number of children: 7  . Years of education: Not on file  . Highest education level: Some college, no degree  Occupational History  . Occupation: member Administrator, arts   Social Needs  . Financial resource strain: Not hard at all  . Food insecurity    Worry: Never true    Inability: Never true  . Transportation needs    Medical: No    Non-medical: No  Tobacco Use  . Smoking status: Current Every Day Smoker    Packs/day: 0.25    Years: 38.00    Pack years: 9.50    Types: Cigarettes  . Smokeless tobacco: Never Used  Substance and Sexual Activity  . Alcohol use:  No    Alcohol/week: 0.0 standard drinks  . Drug use: No  . Sexual activity: Not Currently  Lifestyle  . Physical activity    Days per week: 0 days    Minutes per session: 0 min  . Stress: Not at all  Relationships  . Social connections    Talks on phone: More than three times a week    Gets together: More than three times a week    Attends religious service: More than 4 times per year    Active member of club or organization: No    Attends meetings of clubs or organizations: Never    Relationship status: Married  . Intimate partner violence    Fear of current or ex partner: No    Emotionally abused: No    Physically abused: No     Forced sexual activity: No  Other Topics Concern  . Not on file  Social History Narrative   Married , has 7 grown children, working part time    Outpatient Encounter Medications as of 04/22/2019  Medication Sig Note  . [DISCONTINUED] dextromethorphan-guaiFENesin (MUCINEX DM) 30-600 MG 12hr tablet Take 1 tablet by mouth 2 (two) times daily.   Marland Kitchen. albuterol (PROAIR HFA) 108 (90 Base) MCG/ACT inhaler Inhale 2 puffs into the lungs every 6 (six) hours as needed for wheezing or shortness of breath.   . Cholecalciferol (VITAMIN D) 2000 units CAPS Take 1 capsule (2,000 Units total) by mouth daily. (Patient not taking: Reported on 04/22/2019)   . fluticasone (FLONASE) 50 MCG/ACT nasal spray Place 2 sprays into both nostrils daily.   . hydrochlorothiazide (HYDRODIURIL) 12.5 MG tablet Take 1 tablet (12.5 mg total) by mouth daily.   . hydrOXYzine (ATARAX/VISTARIL) 10 MG tablet Take 1 tablet (10 mg total) by mouth 3 (three) times daily as needed for anxiety.   . meclizine (ANTIVERT) 12.5 MG tablet Take 1 tablet (12.5 mg total) by mouth 3 (three) times daily as needed for dizziness.   . [DISCONTINUED] ALPRAZolam (XANAX) 0.5 MG tablet Take 1 tablet (0.5 mg total) by mouth 2 (two) times daily as needed for anxiety (return to office for next RF). (Patient not taking: Reported on 04/22/2019)   . [DISCONTINUED] citalopram (CELEXA) 20 MG tablet Take 1 tablet (20 mg total) by mouth daily. (Patient not taking: Reported on 04/22/2019)   . [DISCONTINUED] cyclobenzaprine (FLEXERIL) 10 MG tablet Take 10 mg by mouth as needed.  [facility]  . [DISCONTINUED] fluticasone (FLONASE) 50 MCG/ACT nasal spray Place 2 sprays into both nostrils daily. (Patient not taking: Reported on 04/22/2019)   . [DISCONTINUED] lubiprostone (AMITIZA) 8 MCG capsule Take 1 capsule (8 mcg total) by mouth 2 (two) times daily with a meal. (Patient not taking: Reported on 04/22/2019)   . [DISCONTINUED] PROAIR HFA  108 (90 Base) MCG/ACT inhaler INHALE 2 PUFFS FOUR TIMES A DAY (Patient not taking: Reported on 04/22/2019)   . [DISCONTINUED] triamterene-hydrochlorothiazide (MAXZIDE-25) 37.5-25 MG tablet Take 0.5 tablets by mouth daily. (Patient not taking: Reported on 04/22/2019)   . [DISCONTINUED] umeclidinium-vilanterol (ANORO ELLIPTA) 62.5-25 MCG/INH AEPB Inhale 1 puff into the lungs daily. (Patient not taking: Reported on 04/22/2019)    No facility-administered encounter medications on file as of 04/22/2019.     Allergies  Allergen Reactions  . Penicillins Swelling  . Simvastatin   . Zoloft [Sertraline Hcl] Other (See Comments)    Shop lift    Care Team Updated in EHR: Yes [facility]  Last  Vision Exam:  Wears corrective lenses: Yes Last Dental Exam: long time ago Last Hearing Exam:  Wears Hearing Aids: No  Functional Ability / Safety Screening 1.  Was the timed Get Up and Go test shorter than 30 seconds?  yes 2.  Does the patient need help with the phone, transportation, shopping,      preparing meals, housework, laundry, medications, or managing money?  no 3.  Is the patient's home free of loose throw rugs in walkways, pet beds, electrical cords, etc?   yes      Grab bars in the bathroom? yes      Handrails on the stairs?   yes      Adequate lighting?   yes 4.  Has the patient noticed any hearing difficulties?   no  Diet Recall and Exercise Regimen: she eats fish once a week, lots of vegetables, avoids a lot of bread .   Advanced Care Planning: A voluntary discussion about advance care planning including the explanation and discussion of advance directives.  Discussed health care proxy and Living will, and the patient was able to identify a health care proxy as husband .  Patient does not have a living will at present time. Does patient have a HCPOA?    no Does patient have a living will or MOST form?  no  Cancer Screenings:  Skin: discussed atypical lesions  Lung: Low Dose CT  Chest recommended if Age 32-80 years, 30 pack-year currently smoking OR have quit w/in 15years. Patient does qualify. She is not interested in having it done  Breast:  Up to date on Mammogram? No  Up to date of Bone Density/Dexa? No Colon: due for colonoscopy   Additional Screenings:  Hepatitis B/HIV/Syphillis:not interested  Hepatitis C Screening: today  Intimate Partner Violence: negative   Objective:   Vitals: BP 140/90   Pulse 79   Temp 97.7 F (36.5 C) (Temporal)   Resp 16   Ht 5' (1.524 m)   Wt 130 lb 14.4 oz (59.4 kg)   SpO2 98%   BMI 25.56 kg/m  Body mass index is 25.56 kg/m.   Hearing Screening   Method: Audiometry             Right ear:   Pass Pass Pass  Pass    Left ear:   Pass Pass Pass  Pass      Visual Acuity Screening   Right eye Left eye Both eyes  Without correction:     With correction: Physical Exam Constitutional: Patient appears well-developed and well-nourished. No distress.  HEENT: head atraumatic, normocephalic, pupils equal and reactive to light, ears canal has some irritation, eczematous changes , neck supple Cardiovascular: Normal rate, regular rhythm and normal heart sounds.  No murmur heard. No BLE edema. Pulmonary/Chest: Effort normal , rhonchi posteriorly ,  No respiratory distress. Abdominal: Soft.  There is no tenderness. Psychiatric: Patient has a normal mood and affect. behavior is normal. Judgment and thought content normal.  Cognitive Testing - 6-CIT  Correct? Score   What year is it? yes 0 Yes = 0    No = 4  What month is it? yes 0 Yes = 0    No = 3  Remember:     Floyde Parkins, [address], [place]     What time is it? yes 0 Yes = 0    No = 3  Count backwards from 20 to 1  yes 0 Correct = 0    1 error = 2   More than 1 error = 4  Say the months of the year in reverse. yes 0 Correct = 0    1 error = 2   More than 1 error = 4  What address did I ask you to  remember? yes 0 Correct = 0  1 error = 2    2 error = 4    3 error = 6    4 error = 8    All wrong = 10       TOTAL SCORE  0/28   Interpretation:  Normal  Normal (0-7) Abnormal (8-28)   Fall Risk: Fall Risk  04/22/2019 02/13/2017 07/24/2016 02/15/2016 10/11/2015  Falls in the past year? 0 No No No No  Number falls in past yr: 0 - - - -  Injury with Fall? 0 - - - -    Depression Screen Depression screen [facility] 2/9 04/22/2019 02/13/2017 07/24/2016 02/15/2016 10/11/2015  Decreased Interest 0 0 0 0 0  Down, Depressed, Hopeless 0 0 0 0 0  PHQ - 2 Score 0 0 0 0 0  Altered sleeping 0 - - - -  Tired, decreased energy 0 - - - -  Change in appetite 0 - - - -  Feeling bad or failure about yourself  0 - - - -  Trouble concentrating 0 - - - -  Moving slowly or fidgety/restless 0 - - - -  Suicidal thoughts 0 - - - -  PHQ-9 Score 0 - - - -    No results found for this or any previous visit (from the past 2160 hour(s)).  Assessment & Plan:    1. Welcome to Medicare preventive visit  - Ambulatory referral to Gastroenterology - MM 3D SCREEN BREAST BILATERAL; Future - DG Bone Density; Future - Pneumococcal conjugate vaccine 13-valent IM - COMPLETE METABOLIC PANEL WITH GFR - CBC with Differential/Platelet  2. Need for vaccination with 13-polyvalent pneumococcal conjugate vaccine  - Pneumococcal conjugate vaccine 13-valent IM   3. Encounter for screening mammogram for breast cancer  - MM 3D SCREEN BREAST BILATERAL; Future  4. Colon cancer screening  - Ambulatory referral to Gastroenterology  5. Postmenopausal  - DG Bone Density; Future  6. Need for hepatitis C screening test  - Hepatitis C Antibody  7. Avitaminosis D  - VITAMIN D 25 Hydroxy (Vit-D Deficiency, Fractures)  8. Simple chronic bronchitis (HCC)   9. Obstructive apnea   10. Blood glucose elevated  - Hemoglobin A1c  11. History of gestational diabetes  - COMPLETE METABOLIC PANEL WITH GFR - Hemoglobin A1c - Lipid  panel   Exercise Activities and Dietary recommendations  Resume walking daily again  - Discussed health benefits of physical activity, and encouraged her to engage in regular exercise appropriate for her age and condition.   There is no immunization history for the selected administration types on file for this patient.  Health Maintenance  Topic Date Due  . TETANUS/TDAP  02/05/1973  . COLONOSCOPY  02/06/2004  . MAMMOGRAM  12/04/2017  . DEXA SCAN  02/06/2019  . PNA vac Low Risk Adult (1 of 2 - PCV13) 02/06/2019  . PAP SMEAR-Modifier  03/26/2019  . Hepatitis C Screening  09/21/2019 (Originally August 13, 1956)  . HIV Screening  09/30/2019 (Originally 02/05/1971)  . INFLUENZA VACCINE  05/07/2019    Meds ordered this encounter  Medications  . hydrOXYzine (ATARAX/VISTARIL) 10 MG tablet  Sig: Take 1 tablet (10 mg total) by mouth 3 (three) times daily as needed for anxiety.    Dispense:  30 tablet    Refill:  0  . fluticasone (FLONASE) 50 MCG/ACT nasal spray    Sig: Place 2 sprays into both nostrils daily.    Dispense:  16 g    Refill:  2  . albuterol (PROAIR HFA) 108 (90 Base) MCG/ACT inhaler    Sig: Inhale 2 puffs into the lungs every 6 (six) hours as needed for wheezing or shortness of breath.    Dispense:  18 g    Refill:  0  . hydrochlorothiazide (HYDRODIURIL) 12.5 MG tablet    Sig: Take 1 tablet (12.5 mg total) by mouth daily.    Dispense:  90 tablet    Refill:  1  . meclizine (ANTIVERT) 12.5 MG tablet    Sig: Take 1 tablet (12.5 mg total) by mouth 3 (three) times daily as needed for dizziness.    Dispense:  30 tablet    Refill:  0    Current Outpatient Medications:  .  albuterol (PROAIR HFA) 108 (90 Base) MCG/ACT inhaler, Inhale 2 puffs into the lungs every 6 (six) hours as needed for wheezing or shortness of breath., Disp: 18 g, Rfl: 0 .  Cholecalciferol (VITAMIN D) 2000 units CAPS, Take 1 capsule (2,000 Units total) by mouth daily. (Patient not taking: Reported on  04/22/2019), Disp: 30 capsule, Rfl: 0 .  fluticasone (FLONASE) 50 MCG/ACT nasal spray, Place 2 sprays into both nostrils daily., Disp: 16 g, Rfl: 2 .  hydrochlorothiazide (HYDRODIURIL) 12.5 MG tablet, Take 1 tablet (12.5 mg total) by mouth daily., Disp: 90 tablet, Rfl: 1 .  hydrOXYzine (ATARAX/VISTARIL) 10 MG tablet, Take 1 tablet (10 mg total) by mouth 3 (three) times daily as needed for anxiety., Disp: 30 tablet, Rfl: 0 .  meclizine (ANTIVERT) 12.5 MG tablet, Take 1 tablet (12.5 mg total) by mouth 3 (three) times daily as needed for dizziness., Disp: 30 tablet, Rfl: 0 Medications Discontinued During This Encounter  Medication Reason  . dextromethorphan-guaiFENesin (MUCINEX DM) 30-600 MG 12hr tablet Completed Course  . citalopram (CELEXA) 20 MG tablet Patient Discharge  . ALPRAZolam (XANAX) 0.5 MG tablet Discontinued by provider  . triamterene-hydrochlorothiazide (MAXZIDE-25) 37.5-25 MG tablet Change in therapy  . lubiprostone (AMITIZA) 8 MCG capsule Patient Discharge  . umeclidinium-vilanterol (ANORO ELLIPTA) 62.5-25 MCG/INH AEPB Patient Discharge  . PROAIR HFA 108 (90 Base) MCG/ACT inhaler Reorder  . fluticasone (FLONASE) 50 MCG/ACT nasal spray Reorder  . cyclobenzaprine (FLEXERIL) 10 MG tablet Completed Course    I have personally reviewed and addressed the Medicare Annual Wellness health risk assessment questionnaire and have noted the following in the patient's chart:  A.         Medical and social history & family history B.         Use of alcohol, tobacco, and illicit drugs  C.         Current medications and supplements D.         Functional and Cognitive ability and status E.         Nutritional status F.         Physical activity G.        Advance directives H.         List of other physicians I.          Hospitalizations, surgeries, and ER visits in previous 12 months J.  Vitals K.         Screenings such as hearing, vision, cognitive function, and depression L.          Referrals and appointments: GI, mammogram and bone density   In addition, I have reviewed and discussed with patient certain preventive protocols, quality metrics, and best practice recommendations. A written personalized care plan for preventive services as well as general preventive health recommendations were provided to patient.   See attached scanned questionnaire for additional information.

## 2019-04-25 LAB — COMPLETE METABOLIC PANEL WITH GFR
AG Ratio: 1.5 (calc) (ref 1.0–2.5)
ALT: 16 U/L (ref 6–29)
AST: 16 U/L (ref 10–35)
Albumin: 4 g/dL (ref 3.6–5.1)
Alkaline phosphatase (APISO): 78 U/L (ref 37–153)
BUN: 18 mg/dL (ref 7–25)
CO2: 25 mmol/L (ref 20–32)
Calcium: 9.5 mg/dL (ref 8.6–10.4)
Chloride: 105 mmol/L (ref 98–110)
Creat: 0.59 mg/dL (ref 0.50–0.99)
GFR, Est African American: 111 mL/min/{1.73_m2} (ref >=60)
GFR, Est Non African American: 96 mL/min/{1.73_m2} (ref >=60)
Globulin: 2.6 g/dL (calc) (ref 1.9–3.7)
Glucose, Bld: 90 mg/dL (ref 65–99)
Potassium: 3.9 mmol/L (ref 3.5–5.3)
Sodium: 139 mmol/L (ref 135–146)
Total Bilirubin: 0.3 mg/dL (ref 0.2–1.2)
Total Protein: 6.6 g/dL (ref 6.1–8.1)

## 2019-04-25 LAB — CBC WITH DIFFERENTIAL/PLATELET
Absolute Monocytes: 375 cells/uL (ref 200–950)
Basophils Absolute: 67 cells/uL (ref 0–200)
Basophils Relative: 1 %
Eosinophils Absolute: 80 cells/uL (ref 15–500)
Eosinophils Relative: 1.2 %
HCT: 44.4 % (ref 35.0–45.0)
Hemoglobin: 14.6 g/dL (ref 11.7–15.5)
Lymphs Abs: 1481 cells/uL (ref 850–3900)
MCH: 28.9 pg (ref 27.0–33.0)
MCHC: 32.9 g/dL (ref 32.0–36.0)
MCV: 87.9 fL (ref 80.0–100.0)
MPV: 10.5 fL (ref 7.5–12.5)
Monocytes Relative: 5.6 %
Neutro Abs: 4697 cells/uL (ref 1500–7800)
Neutrophils Relative %: 70.1 %
Platelets: 317 10*3/uL (ref 140–400)
RBC: 5.05 10*6/uL (ref 3.80–5.10)
RDW: 13.6 % (ref 11.0–15.0)
Total Lymphocyte: 22.1 %
WBC: 6.7 10*3/uL (ref 3.8–10.8)

## 2019-04-25 LAB — HEPATITIS C ANTIBODY
Hepatitis C Ab: NONREACTIVE
SIGNAL TO CUT-OFF: 0.01 (ref <1.00)

## 2019-04-25 LAB — HEMOGLOBIN A1C
Hgb A1c MFr Bld: 5.9 % of total Hgb — ABNORMAL HIGH (ref <5.7)
Mean Plasma Glucose: 123 (calc)
eAG (mmol/L): 6.8 (calc)

## 2019-04-25 LAB — LIPID PANEL
Cholesterol: 259 mg/dL — ABNORMAL HIGH (ref <200)
HDL: 45 mg/dL — ABNORMAL LOW (ref >=50)
LDL Cholesterol (Calc): 183 mg/dL (calc) — ABNORMAL HIGH
Non-HDL Cholesterol (Calc): 214 mg/dL (calc) — ABNORMAL HIGH (ref <130)
Total CHOL/HDL Ratio: 5.8 (calc) — ABNORMAL HIGH (ref <5.0)
Triglycerides: 163 mg/dL — ABNORMAL HIGH (ref <150)

## 2019-04-25 LAB — VITAMIN D 25 HYDROXY (VIT D DEFICIENCY, FRACTURES): Vit D, 25-Hydroxy: 28 ng/mL — ABNORMAL LOW (ref 30–100)

## 2019-04-26 ENCOUNTER — Other Ambulatory Visit: Payer: Self-pay

## 2019-04-26 ENCOUNTER — Other Ambulatory Visit: Payer: Self-pay | Admitting: Family Medicine

## 2019-04-26 MED ORDER — ROSUVASTATIN CALCIUM 10 MG PO TABS: 10.0000 mg | ORAL_TABLET | Freq: Every day | ORAL | 1 refills | Status: DC

## 2019-04-27 ENCOUNTER — Other Ambulatory Visit: Payer: Self-pay

## 2019-04-27 MED ORDER — ROSUVASTATIN CALCIUM 10 MG PO TABS: 10.0000 mg | ORAL_TABLET | Freq: Every day | ORAL | 1 refills | Status: DC

## 2019-04-27 NOTE — Telephone Encounter (Signed)
Copied from [place] [id number]. Topic: General - Other >> Apr 27, 2019 10:51 AM Leward Quan A wrote: Reason for CRM: Patient called to request that Rx for  rosuvastatin (CRESTOR) 10 MG tablet is resent to [facility], ask to be called at  Ph# [phone] when done

## 2019-05-11 ENCOUNTER — Emergency Department
Admission: EM | Admit: 2019-05-11 | Discharge: 2019-05-11 | Disposition: A | Discharge status: Home / Self Care | Payer: Medicare PPO | Attending: Emergency Medicine | Admitting: Emergency Medicine

## 2019-05-11 ENCOUNTER — Encounter: Payer: Self-pay | Admitting: Emergency Medicine

## 2019-05-11 ENCOUNTER — Other Ambulatory Visit: Payer: Self-pay

## 2019-05-11 DIAGNOSIS — F17210 Nicotine dependence, cigarettes, uncomplicated: Secondary | ICD-10-CM | POA: Diagnosis not present

## 2019-05-11 DIAGNOSIS — Z20828 Contact with and (suspected) exposure to other viral communicable diseases: Secondary | ICD-10-CM

## 2019-05-11 DIAGNOSIS — U071 COVID-19: Secondary | ICD-10-CM | POA: Diagnosis not present

## 2019-05-11 DIAGNOSIS — Z20822 Contact with and (suspected) exposure to covid-19: Secondary | ICD-10-CM

## 2019-05-11 NOTE — Discharge Instructions (Signed)
You will be notified of any positive COVID results; it generally takes 2 to 4 days to get results.  Until then, please quarantine yourself.  Return to the ER for worsening symptoms, persistent vomiting, difficulty breathing or other concerns.

## 2019-05-11 NOTE — ED Notes (Signed)
Pt mother tested positive for covid while in the ED. Pt asymptomatic and wants test.

## 2019-05-11 NOTE — ED Provider Notes (Signed)
[facility] Emergency Department Provider Note   ____________________________________________   First MD Initiated Contact with Patient 05/11/19 [phone]     (approximate)  I have reviewed the triage vital signs and the nursing notes.   HISTORY  Chief Complaint Wants COVID Test    HPI Kristine Lowe is a 63 y.o. female who was in the ED with her mother who tested positive for COVID and was transferred to another hospital.  Now daughter also wants COVID testing; states she is her mother's primary caregiver.  Patient denies any symptoms.  Specifically, denies fever, cough, chest pain, shortness of breath, abdominal pain, nausea or vomiting.       Past Medical History:  Diagnosis Date  . Allergy   . Anxiety   . Chronic obstructive airway disease ([place])   . Dizziness and giddiness   . Esophageal reflux   . Insomnia   . Lumbago   . Lumbosacral neuritis   . Pure hypercholesterolemia     Patient Active Problem List   Diagnosis Date Noted  . History of gestational diabetes 02/13/2017  . Avitaminosis D 02/15/2016  . Obstructive apnea 02/15/2016  . Blood glucose elevated 02/15/2016  . Abnormal LFTs 02/15/2016  . Dermatitis, eczematoid 02/15/2016  . Generalized anxiety disorder 10/11/2015  . Simple chronic bronchitis ([place]) 10/11/2015  . Chronic back pain 10/11/2015  . Chronic constipation 10/11/2015  . Reflux esophagitis 05/01/2007    Past Surgical History:  Procedure Laterality Date  . BREAST EXCISIONAL BIOPSY Left 1996   neg    Prior to Admission medications   Medication Sig Start Date End Date Taking? Authorizing Provider  albuterol (PROAIR HFA) 108 (90 Base) MCG/ACT inhaler Inhale 2 puffs into the lungs every 6 (six) hours as needed for wheezing or shortness of breath. 04/22/19   Steele Sizer, MD  Cholecalciferol (VITAMIN D) 2000 units CAPS Take 1 capsule (2,000 Units total) by mouth daily. Patient not taking: Reported on 04/22/2019  02/13/17   Steele Sizer, MD  fluticasone [facility]) 50 MCG/ACT nasal spray Place 2 sprays into both nostrils daily. 04/22/19   Steele Sizer, MD  hydrochlorothiazide (HYDRODIURIL) 12.5 MG tablet Take 1 tablet (12.5 mg total) by mouth daily. 04/22/19   Steele Sizer, MD  hydrOXYzine (ATARAX/VISTARIL) 10 MG tablet Take 1 tablet (10 mg total) by mouth 3 (three) times daily as needed for anxiety. 04/22/19   Steele Sizer, MD  meclizine (ANTIVERT) 12.5 MG tablet Take 1 tablet (12.5 mg total) by mouth 3 (three) times daily as needed for dizziness. 04/22/19   Steele Sizer, MD  rosuvastatin (CRESTOR) 10 MG tablet Take 1 tablet (10 mg total) by mouth daily. 04/27/19   Steele Sizer, MD    Allergies Penicillins, Simvastatin, and Zoloft [sertraline hcl]  Family History  Problem Relation Age of Onset  . Breast cancer Mother 65  . Breast cancer Maternal Aunt 60  . Breast cancer Paternal Grandmother     Social History Social History   Tobacco Use  . Smoking status: Current Every Day Smoker    Packs/day: 0.25    Years: 38.00    Pack years: 9.50    Types: Cigarettes  . Smokeless tobacco: Never Used  Substance Use Topics  . Alcohol use: No    Alcohol/week: 0.0 standard drinks  . Drug use: No    Review of Systems  Constitutional: No fever/chills Eyes: No visual changes. ENT: No sore throat. Cardiovascular: Denies chest pain. Respiratory: Denies shortness of breath. Gastrointestinal: No abdominal pain.  No nausea, no vomiting.  No diarrhea.  No constipation. Genitourinary: Negative for dysuria. Musculoskeletal: Negative for back pain. Skin: Negative for rash. Neurological: Negative for headaches, focal weakness or numbness.   ____________________________________________   PHYSICAL EXAM:  VITAL SIGNS: ED Triage Vitals  Enc Vitals Group     BP 05/11/19 0507 (!) 148/95     Pulse Rate 05/11/19 0507 (!) 102     Resp 05/11/19 0507 18     Temp 05/11/19 0507 97.9 F (36.6 C)      Temp Source 05/11/19 0507 Oral     SpO2 05/11/19 0507 100 %     Weight 05/11/19 0502 130 lb (59 kg)     Height 05/11/19 0502 5\' 3"  (1.6 m)     Head Circumference --      Peak Flow --      Pain Score 05/11/19 0502 0     Pain Loc --      Pain Edu? --      Excl. in GC? --     Constitutional: Alert and oriented. Well appearing and in no acute distress. Eyes: Conjunctivae are normal. PERRL. EOMI. Head: Atraumatic. Nose: No congestion/rhinnorhea. Mouth/Throat: Mucous membranes are moist.  Oropharynx non-erythematous. Neck: No stridor.   Cardiovascular: Normal rate, regular rhythm. Grossly normal heart sounds.  Good peripheral circulation. Respiratory: Normal respiratory effort.  No retractions. Lungs CTAB. Gastrointestinal: Soft and nontender. No distention. No abdominal bruits. No CVA tenderness. Musculoskeletal: No lower extremity tenderness nor edema.  No joint effusions. Neurologic:  Normal speech and language. No gross focal neurologic deficits are appreciated. No gait instability. Skin:  Skin is warm, dry and intact. No rash noted. Psychiatric: Mood and affect are normal. Speech and behavior are normal.  ____________________________________________   LABS (all labs ordered are listed, but only abnormal results are displayed)  Labs Reviewed  NOVEL CORONAVIRUS, NAA (HOSPITAL ORDER, SEND-OUT TO REF LAB)   ____________________________________________  EKG  None ____________________________________________  RADIOLOGY  ED MD interpretation: None  Official radiology report(s): No results found.  ____________________________________________   PROCEDURES  Procedure(s) performed (including Critical Care):  Procedures   ____________________________________________   INITIAL IMPRESSION / ASSESSMENT AND PLAN / ED COURSE  As part of my medical decision making, I reviewed the following data within the electronic MEDICAL RECORD NUMBER Nursing notes reviewed and  incorporated and Notes from prior ED visits     [name] was evaluated in Emergency Department on 05/11/2019 for the symptoms described in the history of present illness. She was evaluated in the context of the global COVID-19 pandemic, which necessitated consideration that the patient might be at risk for infection with the SARS-CoV-2 virus that causes COVID-19. Institutional protocols and algorithms that pertain to the evaluation of patients at risk for COVID-19 are in a state of rapid change based on information released by regulatory bodies including the CDC and federal and state organizations. These policies and algorithms were followed during the patient's care in the ED.   63 year old female whose mother is hospitalized with COVID-19.  She is asymptomatic and requesting COVID testing.  Will obtain send out swab.  Quarantine instructions given.  Strict return precautions given.  Patient verbalizes understanding and agrees with plan of care.      ____________________________________________   FINAL CLINICAL IMPRESSION(S) / ED DIAGNOSES  Final diagnoses:  Close Exposure to Covid-19 Virus     ED Discharge Orders    None       Note:  This document was prepared  using Conservation officer, historic buildingsDragon voice recognition software and may include unintentional dictation errors.   Irean HongSung, Jade J, MD 05/11/19 [phone]

## 2019-05-11 NOTE — ED Triage Notes (Signed)
Pt arrived via POV with request for COVID test. Pt denies any sxs at this time, but has been in close contact with patient that is positive.

## 2019-05-12 ENCOUNTER — Telehealth: Payer: Self-pay | Admitting: Emergency Medicine

## 2019-05-12 LAB — NOVEL CORONAVIRUS, NAA (HOSP ORDER, SEND-OUT TO REF LAB; TAT 18-24 HRS): SARS-CoV-2, NAA: DETECTED — AB

## 2019-05-12 NOTE — Telephone Encounter (Signed)
Called pateint and gave her covid positive test result.  Instructions on quarantine for 10 days from yesterday-as she does not have symptoms.

## 2019-05-18 ENCOUNTER — Encounter: Payer: Self-pay | Admitting: Family Medicine

## 2019-05-27 ENCOUNTER — Encounter: Payer: Self-pay | Admitting: Family Medicine

## 2019-05-30 ENCOUNTER — Other Ambulatory Visit: Payer: Self-pay | Admitting: Family Medicine

## 2019-05-30 ENCOUNTER — Encounter: Payer: Self-pay | Admitting: Family Medicine

## 2019-05-30 MED ORDER — ALPRAZOLAM 0.5 MG PO TBDP: 0.5000 mg | ORAL_TABLET | Freq: Every day | ORAL | 0 refills | Status: DC | PRN

## 2019-06-29 ENCOUNTER — Encounter: Payer: Self-pay | Admitting: Family Medicine

## 2019-06-29 DIAGNOSIS — E785 Hyperlipidemia, unspecified: Secondary | ICD-10-CM | POA: Insufficient documentation

## 2019-07-22 ENCOUNTER — Ambulatory Visit: Payer: Medicare PPO | Admitting: Family Medicine

## 2019-07-26 ENCOUNTER — Ambulatory Visit (INDEPENDENT_AMBULATORY_CARE_PROVIDER_SITE_OTHER): Payer: Medicare PPO | Admitting: Family Medicine

## 2019-07-26 ENCOUNTER — Other Ambulatory Visit (HOSPITAL_COMMUNITY)
Admission: RE | Admit: 2019-07-26 | Discharge: 2019-07-26 | Disposition: A | Discharge status: Home / Self Care | Payer: Medicare PPO | Source: Ambulatory Visit | Attending: Family Medicine | Admitting: Family Medicine

## 2019-07-26 ENCOUNTER — Encounter: Payer: Self-pay | Admitting: Family Medicine

## 2019-07-26 ENCOUNTER — Other Ambulatory Visit: Payer: Self-pay

## 2019-07-26 VITALS — BP 136/84 | HR 94 | Temp 97.1°F | Resp 16 | Ht 60.0 in | Wt 121.2 lb

## 2019-07-26 DIAGNOSIS — F411 Generalized anxiety disorder: Secondary | ICD-10-CM | POA: Diagnosis not present

## 2019-07-26 DIAGNOSIS — J410 Simple chronic bronchitis: Secondary | ICD-10-CM | POA: Diagnosis not present

## 2019-07-26 DIAGNOSIS — E785 Hyperlipidemia, unspecified: Secondary | ICD-10-CM

## 2019-07-26 DIAGNOSIS — Z23 Encounter for immunization: Secondary | ICD-10-CM | POA: Diagnosis not present

## 2019-07-26 DIAGNOSIS — G4733 Obstructive sleep apnea (adult) (pediatric): Secondary | ICD-10-CM

## 2019-07-26 DIAGNOSIS — I10 Essential (primary) hypertension: Secondary | ICD-10-CM

## 2019-07-26 DIAGNOSIS — Z79899 Other long term (current) drug therapy: Secondary | ICD-10-CM

## 2019-07-26 DIAGNOSIS — Z124 Encounter for screening for malignant neoplasm of cervix: Secondary | ICD-10-CM

## 2019-07-26 DIAGNOSIS — Z1211 Encounter for screening for malignant neoplasm of colon: Secondary | ICD-10-CM

## 2019-07-26 LAB — COMPLETE METABOLIC PANEL WITH GFR
AG Ratio: 1.6 (calc) (ref 1.0–2.5)
ALT: 16 U/L (ref 6–29)
AST: 14 U/L (ref 10–35)
Albumin: 4.6 g/dL (ref 3.6–5.1)
Alkaline phosphatase (APISO): 78 U/L (ref 37–153)
BUN: 25 mg/dL (ref 7–25)
CO2: 26 mmol/L (ref 20–32)
Calcium: 10.2 mg/dL (ref 8.6–10.4)
Chloride: 103 mmol/L (ref 98–110)
Creat: 0.82 mg/dL (ref 0.50–0.99)
GFR, Est African American: 87 mL/min/{1.73_m2} (ref >=60)
GFR, Est Non African American: 75 mL/min/{1.73_m2} (ref >=60)
Globulin: 2.8 g/dL (calc) (ref 1.9–3.7)
Glucose, Bld: 88 mg/dL (ref 65–99)
Potassium: 3.8 mmol/L (ref 3.5–5.3)
Sodium: 140 mmol/L (ref 135–146)
Total Bilirubin: 0.2 mg/dL (ref 0.2–1.2)
Total Protein: 7.4 g/dL (ref 6.1–8.1)

## 2019-07-26 LAB — LIPID PANEL
Cholesterol: 176 mg/dL (ref <200)
HDL: 51 mg/dL (ref >=50)
LDL Cholesterol (Calc): 103 mg/dL (calc) — ABNORMAL HIGH
Non-HDL Cholesterol (Calc): 125 mg/dL (calc) (ref <130)
Total CHOL/HDL Ratio: 3.5 (calc) (ref <5.0)
Triglycerides: 119 mg/dL (ref <150)

## 2019-07-26 MED ORDER — BUSPIRONE HCL 5 MG PO TABS: 5.0000 mg | ORAL_TABLET | Freq: Three times a day (TID) | ORAL | 0 refills | Status: DC

## 2019-07-26 NOTE — Progress Notes (Signed)
Name: Kristine Lowe   MRN: [id number]    DOB: 1956/03/08   Date:07/26/2019       Progress Note  Subjective  Chief Complaint  Chief Complaint  Patient presents with  . Medication Refill  . Anxiety  . Hyperlipidemia  . IBS with constipation    HPI  History of COVID-19: back in August 2020 , only symptom was lost of taste, she was tested because mother had COVID  GAD:  she used to take Citalopram but prefers to take alprazolam prn, a few times a week, but after we discussed BZD use, we tried hydroxyzine and she did not like it, we will try buspar, GAD is 5 , mother died since her last visit with me, but seems to be doing okay   Intermittent vertigo: going on for years, she states sometimes she gets up and walks towards a wall she denies spinning sensation, takes meclizine prn and wants a refill. Not associated with nausea or vomiting. Very seldom has symptoms .   HTN: she used to take triamterene hctz, but she is down to HCTZ 12.5 mg since Summer 2020 and bp is at goal  , no chest pain or palpitation, unless she is having anxiety attack. She is eating healthier and lost weight   Dyslipidemia: she has been taking crestor since July 2020, no myalgias or side effects of medication and we will recheck labs.   Chronic Bronchitis: she smoked for many years, she quit /switched to vapor in 2014, but when she ran out of alprazolam a couple of years ago she resumed smoking. She is smoking a quarter pack daily, no cough, wheezing or sob, discussed importance of quitting again, used to use Spiriva and after that Anoro but does not want to take daily medication Unchanged   OSA: she never really likely wearing machine, discussed risk of pulmonary hypertension, she is not interested at this time. Unchanged   Pre-diabetes: she has changed her diet, and has lost weight, she denies  episodes of polyuria but denies polydipsia or polyuria   AR: with postnasal drainage and nasal congestion, she  would like a refill of flonase  GERD: occasional symptoms, she asked for Nexium, discussed long term use, and advised Tums prn since symptoms are mild and intermittent at this time  Patient Active Problem List   Diagnosis Date Noted  . Dyslipidemia 06/29/2019  . History of gestational diabetes 02/13/2017  . Avitaminosis D 02/15/2016  . Obstructive apnea 02/15/2016  . Blood glucose elevated 02/15/2016  . Abnormal LFTs 02/15/2016  . Dermatitis, eczematoid 02/15/2016  . Generalized anxiety disorder 10/11/2015  . Simple chronic bronchitis (HCC) 10/11/2015  . Chronic back pain 10/11/2015  . Chronic constipation 10/11/2015  . Reflux esophagitis 05/01/2007    Past Surgical History:  Procedure Laterality Date  . BREAST EXCISIONAL BIOPSY Left 1996   neg    Family History  Problem Relation Age of Onset  . Breast cancer Mother 21  . Breast cancer Maternal Aunt 60  . Breast cancer Paternal Grandmother     Social History   Socioeconomic History  . Marital status: Married    Spouse name: Not on file  . Number of children: 7  . Years of education: Not on file  . Highest education level: Some college, no degree  Occupational History  . Occupation: member Teaching laboratory technician   Social Needs  . Financial resource strain: Not hard at all  . Food insecurity    Worry: Never true  Inability: Never true  . Transportation needs    Medical: No    Non-medical: No  Tobacco Use  . Smoking status: Current Every Day Smoker    Packs/day: 0.25    Years: 38.00    Pack years: 9.50    Types: Cigarettes  . Smokeless tobacco: Never Used  Substance and Sexual Activity  . Alcohol use: No    Alcohol/week: 0.0 standard drinks  . Drug use: No  . Sexual activity: Not Currently  Lifestyle  . Physical activity    Days per week: 0 days    Minutes per session: 0 min  . Stress: Not at all  Relationships  . Social connections    Talks on phone: More than three times a week    Gets together: More than  three times a week    Attends religious service: More than 4 times per year    Active member of club or organization: No    Attends meetings of clubs or organizations: Never    Relationship status: Married  . Intimate partner violence    Fear of current or ex partner: No    Emotionally abused: No    Physically abused: No    Forced sexual activity: No  Other Topics Concern  . Not on file  Social History Narrative   Married , has 7 grown children, working part time     Current Outpatient Medications:  .  albuterol (PROAIR HFA) 108 (90 Base) MCG/ACT inhaler, Inhale 2 puffs into the lungs every 6 (six) hours as needed for wheezing or shortness of breath., Disp: 18 g, Rfl: 0 .  Cholecalciferol (VITAMIN D) 2000 units CAPS, Take 1 capsule (2,000 Units total) by mouth daily., Disp: 30 capsule, Rfl: 0 .  fluticasone (FLONASE) 50 MCG/ACT nasal spray, Place 2 sprays into both nostrils daily., Disp: 16 g, Rfl: 2 .  hydrochlorothiazide (HYDRODIURIL) 12.5 MG tablet, Take 1 tablet (12.5 mg total) by mouth daily., Disp: 90 tablet, Rfl: 1 .  meclizine (ANTIVERT) 12.5 MG tablet, Take 1 tablet (12.5 mg total) by mouth 3 (three) times daily as needed for dizziness., Disp: 30 tablet, Rfl: 0 .  rosuvastatin (CRESTOR) 10 MG tablet, Take 1 tablet (10 mg total) by mouth daily., Disp: 90 tablet, Rfl: 1 .  ALPRAZolam (NIRAVAM) 0.5 MG dissolvable tablet, Take 1 tablet (0.5 mg total) by mouth daily as needed for anxiety. (Patient not taking: Reported on 07/26/2019), Disp: 5 tablet, Rfl: 0 .  hydrOXYzine (ATARAX/VISTARIL) 10 MG tablet, Take 1 tablet (10 mg total) by mouth 3 (three) times daily as needed for anxiety. (Patient not taking: Reported on 07/26/2019), Disp: 30 tablet, Rfl: 0  Allergies  Allergen Reactions  . Penicillins Swelling  . Simvastatin   . Zoloft [Sertraline Hcl] Other (See Comments)    Shop lift    I personally reviewed active problem list, medication list, allergies, family history, social  history, health maintenance with the patient/caregiver today.   ROS  Constitutional: Negative for fever or weight change.  Respiratory: Negative for cough and shortness of breath.   Cardiovascular: Negative for chest pain or palpitations.  Gastrointestinal: Negative for abdominal pain, no bowel changes.  Musculoskeletal: Negative for gait problem or joint swelling.  Skin: Negative for rash.  Neurological: Negative for dizziness or headache.  No other specific complaints in a complete review of systems (except as listed in HPI above).  Objective  Vitals:   07/26/19 1136  BP: 136/84  Pulse: 94  Resp: 16  Temp: Marland Kitchen(!)  97.1 F (36.2 C)  TempSrc: Temporal  SpO2: 95%  Weight: 121 lb 3.2 oz (55 kg)  Height: 5' (1.524 m)    Body mass index is 23.67 kg/m.  Physical Exam  Constitutional: Patient appears well-developed and well-nourished.  No distress.  HEENT: head atraumatic, normocephalic, pupils equal and reactive to light Pelvic: cystocele present, cottage cheese type discharge on vaginal walls but patient states asymptomatic, pap smear collected, normal bimanual exam  Cardiovascular: Normal rate, regular rhythm and normal heart sounds.  No murmur heard. No BLE edema. Pulmonary/Chest: Effort normal and breath sounds normal. No respiratory distress. Abdominal: Soft.  There is no tenderness. Psychiatric: Patient has a normal mood and affect. behavior is normal. Judgment and thought content normal.  Recent Results (from the past 2160 hour(s))  Novel Coronavirus, NAA (send-out to ref lab)     Status: Abnormal   Collection Time: 05/11/19  5:20 AM   Specimen: Nasopharyngeal Swab; Respiratory  Result Value Ref Range   SARS-CoV-2, NAA DETECTED (A) NOT DETECTED    Comment: (NOTE)                  Client Requested Flag This test was developed and its performance characteristics determined by [employer]. This test has not been FDA cleared or approved. This test has been  authorized by FDA under an Emergency Use Authorization (EUA). This test is only authorized for the duration of time the declaration that circumstances exist justifying the authorization of the emergency use of in vitro diagnostic tests for detection of SARS-CoV-2 virus and/or diagnosis of COVID-19 infection under section 564(b)(1) of the Act, 21 U.S.C. 673ALP-3(X)(9), unless the authorization is terminated or revoked sooner. When diagnostic testing is negative, the possibility of a false negative result should be considered in the context of a patient's recent exposures and the presence of clinical signs and symptoms consistent with COVID-19. An individual without symptoms of COVID-19 and who is not shedding SARS-CoV-2 virus would expect to have a negative (not det ected) result in this assay. Performed At: [facility] [place], [place] [id number] Rush Farmer MD GD:[id number]    Coronavirus Source NASOPHARYNGEAL     Comment: Performed at [facility], [place], [place] [address]    PHQ2/9: Depression screen [facility] 2/9 07/26/2019 04/22/2019 02/13/2017 07/24/2016 02/15/2016  Decreased Interest 0 0 0 0 0  Down, Depressed, Hopeless 0 0 0 0 0  PHQ - 2 Score 0 0 0 0 0  Altered sleeping 0 0 - - -  Tired, decreased energy 0 0 - - -  Change in appetite 0 0 - - -  Feeling bad or failure about yourself  0 0 - - -  Trouble concentrating 0 0 - - -  Moving slowly or fidgety/restless 1 0 - - -  Suicidal thoughts 0 0 - - -  PHQ-9 Score 1 0 - - -  Difficult doing work/chores Somewhat difficult - - - -    phq 9 is negative  GAD 7 : Generalized Anxiety Score 07/26/2019 02/13/2017  Nervous, Anxious, on Edge 2 3  Control/stop worrying 0 3  Worry too much - different things 0 3  Trouble relaxing 0 3  Restless 1 3  Easily annoyed or irritable 1 3  Afraid - awful might happen 1 3  Total GAD 7 Score 5 21  Anxiety Difficulty Not difficult at all  Somewhat difficult     Fall Risk: Fall Risk  07/26/2019 04/22/2019 02/13/2017  07/24/2016 02/15/2016  Falls in the past year? 0 0 No No No  Number falls in past yr: 0 0 - - -  Injury with Fall? 0 0 - - -     Functional Status Survey: Is the patient deaf or have difficulty hearing?: No Does the patient have difficulty seeing, even when wearing glasses/contacts?: Yes Does the patient have difficulty concentrating, remembering, or making decisions?: No Does the patient have difficulty walking or climbing stairs?: No Does the patient have difficulty dressing or bathing?: No Does the patient have difficulty doing errands alone such as visiting a doctor's office or shopping?: No    Assessment & Plan  1. Simple chronic bronchitis (HCC)  Discussed tobacco cessation   2. Colon cancer screening  - Cologuard  3. Need for immunization against influenza  - Flu Vaccine QUAD High Dose(Fluad)  4. Obstructive apnea   5. Hypertension, benign  At goal   6. Cervical cancer screening  - Cytology - PAP  7. Dyslipidemia  - Lipid panel  8. Long-term use of high-risk medication  - COMPLETE METABOLIC PANEL WITH GFR  9. GAD (generalized anxiety disorder)  - busPIRone (BUSPAR) 5 MG tablet; Take 1-2 tablets (5-10 mg total) by mouth 3 (three) times daily.  Dispense: 90 tablet; Refill: 0

## 2019-07-26 NOTE — Patient Instructions (Signed)
Ask [employer] if they pay for Tdap

## 2019-07-28 LAB — CYTOLOGY - PAP: Diagnosis: NEGATIVE

## 2019-09-16 ENCOUNTER — Encounter: Payer: Self-pay | Admitting: Family Medicine

## 2019-09-16 ENCOUNTER — Other Ambulatory Visit: Payer: Self-pay | Admitting: Family Medicine

## 2019-09-16 DIAGNOSIS — F411 Generalized anxiety disorder: Secondary | ICD-10-CM

## 2019-09-16 MED ORDER — BUSPIRONE HCL 5 MG PO TABS: 5.0000 mg | ORAL_TABLET | Freq: Three times a day (TID) | ORAL | 0 refills | Status: DC

## 2019-09-20 ENCOUNTER — Encounter: Payer: Self-pay | Admitting: Family Medicine

## 2019-09-20 ENCOUNTER — Ambulatory Visit
Admission: RE | Admit: 2019-09-20 | Discharge: 2019-09-20 | Disposition: A | Discharge status: Home / Self Care | Payer: Medicare PPO | Source: Ambulatory Visit | Attending: Family Medicine | Admitting: Family Medicine

## 2019-09-20 DIAGNOSIS — Z780 Asymptomatic menopausal state: Secondary | ICD-10-CM | POA: Diagnosis not present

## 2019-09-20 DIAGNOSIS — Z1231 Encounter for screening mammogram for malignant neoplasm of breast: Secondary | ICD-10-CM | POA: Diagnosis not present

## 2019-09-20 DIAGNOSIS — Z0000 Encounter for general adult medical examination without abnormal findings: Secondary | ICD-10-CM

## 2019-09-20 DIAGNOSIS — M85851 Other specified disorders of bone density and structure, right thigh: Secondary | ICD-10-CM | POA: Diagnosis not present

## 2019-09-21 ENCOUNTER — Encounter: Payer: Self-pay | Admitting: Family Medicine

## 2019-10-25 ENCOUNTER — Other Ambulatory Visit: Payer: Self-pay | Admitting: Family Medicine

## 2019-10-25 DIAGNOSIS — R0981 Nasal congestion: Secondary | ICD-10-CM

## 2019-11-15 ENCOUNTER — Encounter: Payer: Self-pay | Admitting: Family Medicine

## 2019-11-16 ENCOUNTER — Other Ambulatory Visit: Payer: Self-pay | Admitting: Family Medicine

## 2019-11-16 DIAGNOSIS — F411 Generalized anxiety disorder: Secondary | ICD-10-CM

## 2019-11-16 MED ORDER — BUSPIRONE HCL 5 MG PO TABS: 5.0000 mg | ORAL_TABLET | Freq: Three times a day (TID) | ORAL | 0 refills | Status: DC

## 2019-11-16 NOTE — Telephone Encounter (Signed)
Pt is scheduled medication follow up visit for 12/02/19 at 10:40a with PCP. Pt would like to know if she can have Rx sent in today so that she can pick up after work?    Pharmacy:  [facility] [address] ([address]), [place] - [address] Phone:  [phone]  Fax:  [phone]

## 2019-12-02 ENCOUNTER — Other Ambulatory Visit: Payer: Self-pay

## 2019-12-02 ENCOUNTER — Ambulatory Visit (INDEPENDENT_AMBULATORY_CARE_PROVIDER_SITE_OTHER): Payer: Medicare PPO | Admitting: Family Medicine

## 2019-12-02 ENCOUNTER — Encounter: Payer: Self-pay | Admitting: Family Medicine

## 2019-12-02 VITALS — BP 140/90 | HR 89 | Temp 97.5°F | Resp 16 | Ht 60.0 in | Wt 125.6 lb

## 2019-12-02 DIAGNOSIS — G4733 Obstructive sleep apnea (adult) (pediatric): Secondary | ICD-10-CM | POA: Diagnosis not present

## 2019-12-02 DIAGNOSIS — R0981 Nasal congestion: Secondary | ICD-10-CM

## 2019-12-02 DIAGNOSIS — E785 Hyperlipidemia, unspecified: Secondary | ICD-10-CM

## 2019-12-02 DIAGNOSIS — F411 Generalized anxiety disorder: Secondary | ICD-10-CM

## 2019-12-02 DIAGNOSIS — J410 Simple chronic bronchitis: Secondary | ICD-10-CM | POA: Diagnosis not present

## 2019-12-02 DIAGNOSIS — I10 Essential (primary) hypertension: Secondary | ICD-10-CM

## 2019-12-02 MED ORDER — BUSPIRONE HCL 5 MG PO TABS: 5.0000 mg | ORAL_TABLET | Freq: Three times a day (TID) | ORAL | 2 refills | Status: DC

## 2019-12-02 NOTE — Progress Notes (Signed)
Name: Kristine Lowe   MRN: [id number]    DOB: [birth year]   Date:12/02/2019       Progress Note  Subjective  Chief Complaint  Chief Complaint  Patient presents with  . Medication Management    HPI  GAD:  she used to take Citalopram but prefers to take alprazolam prn, a few times a week, but after we discussed BZD use, we tried hydroxyzine and she did not like it, we started on Buspar in October, and she states first bottle worked well, it was a Hospital doctor however second rx was a Development worker, community and she has noticed that does not work as well, instead of half to one pill, she needs to take two at times to work. We will recheck STR brand  Intermittent vertigo: going on for years, she states sometimes she gets up and walks towards a wall she denies spinning sensation, takes meclizine prn and wants a refill. Not associated with nausea or vomiting. Very seldom has symptoms .  Unchanged   HTN: she used to take triamterene hctz, but she is down to HCTZ 12.5 mg since Summer 2020 and bp is at goal  , no chest pain or palpitation, unless she is having anxiety attack. She has been forgetting to take medications, last dose was a few days ago. Discussed importance of compliance to decrease risk of heart attacks and strokes and to return for bp check   Dyslipidemia: she has been taking crestor since July 2020, no myalgias or side effects of medication and last labs were much better.   Chronic Bronchitis: she smoked for many years, she quit /switched to vapor in 2014, but when she ran out of alprazolam a couple of years ago she resumed smoking. She is smoking a quarter pack daily, no cough, wheezing or sob, she does not want to resume inhalers.   OSA: she never really likely wearing machine, discussed risk of pulmonary hypertension, she is not interested at this time. Unchanged   Pre-diabetes:  but not as active since last visit and also eating more sweets now, gained some weight, we will  recheck level next visit   GERD: occasional symptoms she is taking Alka Seltzer prn   Nasal Congestion: worse when wearing mask, she uses flonase but not daily, discussed avoiding decongestive medication because it will increase bp   Patient Active Problem List   Diagnosis Date Noted  . Dyslipidemia 06/29/2019  . History of gestational diabetes 02/13/2017  . Avitaminosis D 02/15/2016  . Obstructive apnea 02/15/2016  . Blood glucose elevated 02/15/2016  . Abnormal LFTs 02/15/2016  . Dermatitis, eczematoid 02/15/2016  . Generalized anxiety disorder 10/11/2015  . Simple chronic bronchitis (HCC) 10/11/2015  . Chronic back pain 10/11/2015  . Chronic constipation 10/11/2015  . Reflux esophagitis 05/01/2007    Past Surgical History:  Procedure Laterality Date  . BREAST EXCISIONAL BIOPSY Left 1996   neg    Family History  Problem Relation Age of Onset  . Breast cancer Mother 25  . Alzheimer's disease Mother   . Breast cancer Maternal Aunt 60  . Breast cancer Paternal Grandmother     Social History   Tobacco Use  . Smoking status: Current Every Day Smoker    Packs/day: 0.25    Years: 38.00    Pack years: 9.50    Types: Cigarettes  . Smokeless tobacco: Never Used  Substance Use Topics  . Alcohol use: No    Alcohol/week: 0.0 standard drinks  . Drug  use: No     Current Outpatient Medications:  .  albuterol (PROAIR HFA) 108 (90 Base) MCG/ACT inhaler, Inhale 2 puffs into the lungs every 6 (six) hours as needed for wheezing or shortness of breath., Disp: 18 g, Rfl: 0 .  Cholecalciferol (VITAMIN D) 2000 units CAPS, Take 1 capsule (2,000 Units total) by mouth daily., Disp: 30 capsule, Rfl: 0 .  fluticasone (FLONASE) 50 MCG/ACT nasal spray, Use 2 spray(s) in each nostril once daily, Disp: 16 g, Rfl: 1 .  hydrochlorothiazide (HYDRODIURIL) 12.5 MG tablet, Take 1 tablet (12.5 mg total) by mouth daily., Disp: 90 tablet, Rfl: 1 .  meclizine (ANTIVERT) 12.5 MG tablet, Take 1 tablet  (12.5 mg total) by mouth 3 (three) times daily as needed for dizziness., Disp: 30 tablet, Rfl: 0 .  rosuvastatin (CRESTOR) 10 MG tablet, Take 1 tablet (10 mg total) by mouth daily., Disp: 90 tablet, Rfl: 1 .  busPIRone (BUSPAR) 5 MG tablet, Take 1-2 tablets (5-10 mg total) by mouth 3 (three) times daily., Disp: 90 tablet, Rfl: 2  Allergies  Allergen Reactions  . Penicillins Swelling  . Simvastatin   . Zoloft [Sertraline Hcl] Other (See Comments)    Shop lift    I personally reviewed active problem list, medication list, allergies, family history, social history, health maintenance with the patient/caregiver today.   ROS  Constitutional: Negative for fever or weight change.  Respiratory: Negative for cough and shortness of breath.   Cardiovascular: Negative for chest pain or palpitations.  Gastrointestinal: Negative for abdominal pain, no bowel changes.  Musculoskeletal: Negative for gait problem or joint swelling.  Skin: Negative for rash.  Neurological: Positive  for intermittent dizziness but no  headache.  No other specific complaints in a complete review of systems (except as listed in HPI above).  Objective  Vitals:   12/02/19 1102  BP: (!) 150/100  Pulse: 89  Resp: 16  Temp: (!) 97.5 F (36.4 C)  TempSrc: Temporal  SpO2: 97%  Weight: 125 lb 9.6 oz (57 kg)  Height: 5' (1.524 m)    Body mass index is 24.53 kg/m.  Physical Exam  Constitutional: Patient appears well-developed and well-nourished. No distress.  HEENT: head atraumatic, normocephalic, pupils equal and reactive to light Cardiovascular: Normal rate, regular rhythm and normal heart sounds.  No murmur heard. No BLE edema. Pulmonary/Chest: Effort normal and breath sounds normal. No respiratory distress. Abdominal: Soft.  There is no tenderness. Psychiatric: Patient has a normal mood and affect. behavior is normal. Judgment and thought content normal.  PHQ2/9: Depression screen [facility] 2/9 12/02/2019  07/26/2019 04/22/2019 02/13/2017 07/24/2016  Decreased Interest 0 0 0 0 0  Down, Depressed, Hopeless 0 0 0 0 0  PHQ - 2 Score 0 0 0 0 0  Altered sleeping 0 0 0 - -  Tired, decreased energy 0 0 0 - -  Change in appetite 0 0 0 - -  Feeling bad or failure about yourself  0 0 0 - -  Trouble concentrating 0 0 0 - -  Moving slowly or fidgety/restless 0 1 0 - -  Suicidal thoughts 0 0 0 - -  PHQ-9 Score 0 1 0 - -  Difficult doing work/chores - Somewhat difficult - - -    phq 9 is negative  Fall Risk: Fall Risk  12/02/2019 07/26/2019 04/22/2019 02/13/2017 07/24/2016  Falls in the past year? 0 0 0 No No  Number falls in past yr: 0 0 0 - -  Injury with Fall?  0 0 0 - -     Functional Status Survey: Is the patient deaf or have difficulty hearing?: No Does the patient have difficulty seeing, even when wearing glasses/contacts?: No Does the patient have difficulty concentrating, remembering, or making decisions?: No Does the patient have difficulty walking or climbing stairs?: No Does the patient have difficulty dressing or bathing?: No Does the patient have difficulty doing errands alone such as visiting a doctor's office or shopping?: No    Assessment & Plan  1. GAD (generalized anxiety disorder)  - busPIRone (BUSPAR) 5 MG tablet; Take 1-2 tablets (5-10 mg total) by mouth 3 (three) times daily.  Dispense: 90 tablet; Refill: 2  2. Simple chronic bronchitis (HCC)  Stable, not ready to quit smoking   3. Hypertension, benign  Return for bp check only in a couple of weeks  4. Obstructive apnea  Refuses medication   5. Dyslipidemia  On statin   6. Nasal congestion  Resume medication daily

## 2019-12-02 NOTE — Patient Instructions (Signed)
Monistat otc

## 2020-01-19 ENCOUNTER — Other Ambulatory Visit: Payer: Self-pay | Admitting: Family Medicine

## 2020-01-19 ENCOUNTER — Encounter: Payer: Self-pay | Admitting: Family Medicine

## 2020-01-19 DIAGNOSIS — I10 Essential (primary) hypertension: Secondary | ICD-10-CM

## 2020-01-27 ENCOUNTER — Other Ambulatory Visit: Payer: Self-pay | Admitting: Family Medicine

## 2020-02-20 ENCOUNTER — Other Ambulatory Visit: Payer: Self-pay | Admitting: Family Medicine

## 2020-02-20 DIAGNOSIS — R0981 Nasal congestion: Secondary | ICD-10-CM

## 2020-02-29 ENCOUNTER — Other Ambulatory Visit: Payer: Self-pay

## 2020-02-29 ENCOUNTER — Encounter: Payer: Self-pay | Admitting: Family Medicine

## 2020-02-29 ENCOUNTER — Ambulatory Visit: Payer: Medicare PPO | Admitting: Family Medicine

## 2020-02-29 VITALS — BP 120/82 | HR 105 | Temp 98.5°F | Resp 14 | Ht 62.0 in | Wt 127.2 lb

## 2020-02-29 DIAGNOSIS — J410 Simple chronic bronchitis: Secondary | ICD-10-CM | POA: Diagnosis not present

## 2020-02-29 DIAGNOSIS — E559 Vitamin D deficiency, unspecified: Secondary | ICD-10-CM | POA: Diagnosis not present

## 2020-02-29 DIAGNOSIS — I10 Essential (primary) hypertension: Secondary | ICD-10-CM | POA: Diagnosis not present

## 2020-02-29 DIAGNOSIS — G4733 Obstructive sleep apnea (adult) (pediatric): Secondary | ICD-10-CM | POA: Diagnosis not present

## 2020-02-29 DIAGNOSIS — M79662 Pain in left lower leg: Secondary | ICD-10-CM

## 2020-02-29 DIAGNOSIS — E785 Hyperlipidemia, unspecified: Secondary | ICD-10-CM

## 2020-02-29 DIAGNOSIS — R739 Hyperglycemia, unspecified: Secondary | ICD-10-CM | POA: Diagnosis not present

## 2020-02-29 DIAGNOSIS — R0981 Nasal congestion: Secondary | ICD-10-CM

## 2020-02-29 DIAGNOSIS — F411 Generalized anxiety disorder: Secondary | ICD-10-CM | POA: Diagnosis not present

## 2020-02-29 MED ORDER — BUSPIRONE HCL 5 MG PO TABS: 5.0000 mg | ORAL_TABLET | Freq: Two times a day (BID) | ORAL | 1 refills | Status: DC

## 2020-02-29 MED ORDER — ALBUTEROL SULFATE HFA 108 (90 BASE) MCG/ACT IN AERS: 2.0000 | INHALATION_SPRAY | Freq: Four times a day (QID) | RESPIRATORY_TRACT | 0 refills | Status: DC | PRN

## 2020-02-29 MED ORDER — LOSARTAN POTASSIUM 50 MG PO TABS: 50.0000 mg | ORAL_TABLET | Freq: Every day | ORAL | 1 refills | Status: DC

## 2020-02-29 MED ORDER — ROSUVASTATIN CALCIUM 10 MG PO TABS: 10.0000 mg | ORAL_TABLET | Freq: Every day | ORAL | 1 refills | Status: DC

## 2020-02-29 NOTE — Progress Notes (Signed)
Name: Kristine Lowe   MRN: [id number]    DOB: [birth year]   Date:02/29/2020       Progress Note  Subjective  Chief Complaint  Chief Complaint  Patient presents with  . Follow-up    chronic bronchitis    HPI  GAD:she used to take Citalopram but prefers to take alprazolam prn, a few times a week, but after we discussed BZD use, we tried hydroxyzine and she did not like it, we started on Buspar in October 2020 , and she states first bottle worked well, it was a Hospital doctor however second rx was a Development worker, community and she has noticed that does not work as well, instead of half to one pill, she needs to take two at times to work. Doing well on medication   Intermittent vertigo: going on for years, she states sometimes she gets up and walks towards a wall she denies spinning sensation, takes meclizine prn and wants a refill. Not associated with nausea or vomiting. Very seldom has symptoms. Stable   HTN: she used to take triamterene hctz,but she is down to HCTZ 12.5 mg since Summer 2020 and bp is at goal, no chest pain or palpitation, unless she is having anxiety attack. She has been more compliant with her medication since last visit , she has been taking it at night because she thought medication was causing nausea, discussed changing to a different medication that is not a diuretic, we will change to losartan   Dyslipidemia: she has been taking Crestor since July 2020, no myalgias or side effects of medication and last labs showed LDL down from 183 -103   Chronic Bronchitis: she smoked for many years, she quit /switched to vapor in 2014, but when she ran out of alprazolam a couple of years ago she resumed smoking. She is smoking less now, down to 3- 4 cigarettes daily, no cough, wheezing or sob, she will consider trying to transition to vaping and quit again . Strong sent causes her to wheeze and needs refill of ventolin   OSA: she never really likely wearing machine,  discussed risk of pulmonary hypertension, she is not interested at this time. Unchanged   Pre-diabetes: she is still craving sweets again and wants to have labs done. No polyphagia, polydipsia but has nocturia that may be due to diuretic   GERD: occasional symptoms she is taking Alka Seltzer prn   Left calf pain: during rest of activity, she states seems to be getting worse. Denies cramping , not triggered by activity. She states she saw vascular surgeon and was advised to have a procedure about 10 years ago but she did not have insurance at the time, she would like to go back since now she has medicare   Patient Active Problem List   Diagnosis Date Noted  . Dyslipidemia 06/29/2019  . History of gestational diabetes 02/13/2017  . Avitaminosis D 02/15/2016  . Obstructive apnea 02/15/2016  . Blood glucose elevated 02/15/2016  . Abnormal LFTs 02/15/2016  . Dermatitis, eczematoid 02/15/2016  . Generalized anxiety disorder 10/11/2015  . Simple chronic bronchitis (HCC) 10/11/2015  . Chronic back pain 10/11/2015  . Chronic constipation 10/11/2015  . Reflux esophagitis 05/01/2007    Past Surgical History:  Procedure Laterality Date  . BREAST EXCISIONAL BIOPSY Left 1996   neg    Family History  Problem Relation Age of Onset  . Breast cancer Mother 29  . Alzheimer's disease Mother   . Breast cancer Maternal Aunt 60  .  Breast cancer Paternal Grandmother     Social History   Tobacco Use  . Smoking status: Current Every Day Smoker    Packs/day: 0.25    Years: 38.00    Pack years: 9.50    Types: Cigarettes  . Smokeless tobacco: Never Used  Substance Use Topics  . Alcohol use: No    Alcohol/week: 0.0 standard drinks     Current Outpatient Medications:  .  albuterol (PROAIR HFA) 108 (90 Base) MCG/ACT inhaler, Inhale 2 puffs into the lungs every 6 (six) hours as needed for wheezing or shortness of breath., Disp: 18 g, Rfl: 0 .  busPIRone (BUSPAR) 5 MG tablet, Take 1-2  tablets (5-10 mg total) by mouth 3 (three) times daily., Disp: 90 tablet, Rfl: 2 .  Cholecalciferol (VITAMIN D) 2000 units CAPS, Take 1 capsule (2,000 Units total) by mouth daily., Disp: 30 capsule, Rfl: 0 .  fluticasone (FLONASE) 50 MCG/ACT nasal spray, Use 2 spray(s) in each nostril once daily, Disp: 16 g, Rfl: 0 .  hydrochlorothiazide (HYDRODIURIL) 12.5 MG tablet, Take 1 tablet by mouth once daily, Disp: 90 tablet, Rfl: 0 .  meclizine (ANTIVERT) 12.5 MG tablet, Take 1 tablet (12.5 mg total) by mouth 3 (three) times daily as needed for dizziness., Disp: 30 tablet, Rfl: 0 .  rosuvastatin (CRESTOR) 10 MG tablet, Take 1 tablet by mouth once daily, Disp: 90 tablet, Rfl: 0  Allergies  Allergen Reactions  . Penicillins Swelling  . Simvastatin   . Zoloft [Sertraline Hcl] Other (See Comments)    Shop lift    I personally reviewed active problem list, medication list, allergies, family history, social history, health maintenance with the patient/caregiver today.   ROS  Constitutional: Negative for fever , positive for mild  weight change.  Respiratory: Positive  for cough but no  shortness of breath.   Cardiovascular: Negative for chest pain or palpitations.  Gastrointestinal: Negative for abdominal pain, no bowel changes.  Musculoskeletal: Negative for gait problem or joint swelling.  Skin: Negative for rash.  Neurological: Positive  for intermittent dizziness but no headache.  No other specific complaints in a complete review of systems (except as listed in HPI above).  Objective  Vitals:   02/29/20 1122  BP: 120/82  Pulse: (!) 105  Resp: 14  Temp: 98.5 F (36.9 C)  TempSrc: Temporal  SpO2: 97%  Weight: 127 lb 3.2 oz (57.7 kg)  Height: 5\' 2"  (1.575 m)    Body mass index is 23.27 kg/m.  Physical Exam  Constitutional: Patient appears well-developed and well-nourished. No distress.  HEENT: head atraumatic, normocephalic, pupils equal and reactive to light,  neck  supple Cardiovascular: Normal rate, regular rhythm and normal heart sounds.  No murmur heard. No BLE edema.Normal distal pulse Pulmonary/Chest: Effort normal and breath sounds normal. No respiratory distress. Abdominal: Soft.  There is no tenderness. Psychiatric: Patient has a normal mood and affect. behavior is normal. Judgment and thought content normal.  PHQ2/9: Depression screen [facility] 2/9 02/29/2020 12/02/2019 07/26/2019 04/22/2019 02/13/2017  Decreased Interest 0 0 0 0 0  Down, Depressed, Hopeless 0 0 0 0 0  PHQ - 2 Score 0 0 0 0 0  Altered sleeping 0 0 0 0 -  Tired, decreased energy 0 0 0 0 -  Change in appetite 0 0 0 0 -  Feeling bad or failure about yourself  0 0 0 0 -  Trouble concentrating 0 0 0 0 -  Moving slowly or fidgety/restless 0 0 1 0 -  Suicidal thoughts 0 0 0 0 -  PHQ-9 Score 0 0 1 0 -  Difficult doing work/chores - - Somewhat difficult - -    phq 9 is negative  Fall Risk: Fall Risk  12/02/2019 07/26/2019 04/22/2019 02/13/2017 07/24/2016  Falls in the past year? 0 0 0 No No  Number falls in past yr: 0 0 0 - -  Injury with Fall? 0 0 0 - -     Functional Status Survey: Is the patient deaf or have difficulty hearing?: No Does the patient have difficulty seeing, even when wearing glasses/contacts?: Yes(Appointment in June with eye care professional) Does the patient have difficulty concentrating, remembering, or making decisions?: No Does the patient have difficulty walking or climbing stairs?: No Does the patient have difficulty dressing or bathing?: No Does the patient have difficulty doing errands alone such as visiting a doctor's office or shopping?: No    Assessment & Plan  1. GAD (generalized anxiety disorder)  - busPIRone (BUSPAR) 5 MG tablet; Take 1 tablet (5 mg total) by mouth 2 (two) times daily.  Dispense: 180 tablet; Refill: 1  2. Simple chronic bronchitis (HCC)  - albuterol (PROAIR HFA) 108 (90 Base) MCG/ACT inhaler; Inhale 2 puffs into the lungs  every 6 (six) hours as needed for wheezing or shortness of breath.  Dispense: 18 g; Refill: 0  3. Dyslipidemia  - rosuvastatin (CRESTOR) 10 MG tablet; Take 1 tablet (10 mg total) by mouth daily.  Dispense: 90 tablet; Refill: 1 - Lipid panel  4. Obstructive apnea  Not compliant   5. Hypertension, benign  - losartan (COZAAR) 50 MG tablet; Take 1 tablet (50 mg total) by mouth daily.  Dispense: 90 tablet; Refill: 1 - COMPLETE METABOLIC PANEL WITH GFR - CBC with Differential/Platelet  6. Nasal congestion  Continue flonase   7. Vitamin D deficiency  - VITAMIN D 25 Hydroxy (Vit-D Deficiency, Fractures)  8. Blood glucose elevated  - Hemoglobin A1c   9. Pain of left calf  - Ambulatory referral to Vascular Surgery

## 2020-03-01 LAB — VITAMIN D 25 HYDROXY (VIT D DEFICIENCY, FRACTURES): Vit D, 25-Hydroxy: 52 ng/mL (ref 30–100)

## 2020-03-01 LAB — CBC WITH DIFFERENTIAL/PLATELET
Absolute Monocytes: 403 cells/uL (ref 200–950)
Basophils Absolute: 72 cells/uL (ref 0–200)
Basophils Relative: 1.1 %
Eosinophils Absolute: 111 cells/uL (ref 15–500)
Eosinophils Relative: 1.7 %
HCT: 44.7 % (ref 35.0–45.0)
Hemoglobin: 14.2 g/dL (ref 11.7–15.5)
Lymphs Abs: 1762 cells/uL (ref 850–3900)
MCH: 28.5 pg (ref 27.0–33.0)
MCHC: 31.8 g/dL — ABNORMAL LOW (ref 32.0–36.0)
MCV: 89.8 fL (ref 80.0–100.0)
MPV: 10.1 fL (ref 7.5–12.5)
Monocytes Relative: 6.2 %
Neutro Abs: 4154 cells/uL (ref 1500–7800)
Neutrophils Relative %: 63.9 %
Platelets: 340 10*3/uL (ref 140–400)
RBC: 4.98 10*6/uL (ref 3.80–5.10)
RDW: 13.6 % (ref 11.0–15.0)
Total Lymphocyte: 27.1 %
WBC: 6.5 10*3/uL (ref 3.8–10.8)

## 2020-03-01 LAB — COMPLETE METABOLIC PANEL WITH GFR
AG Ratio: 1.6 (calc) (ref 1.0–2.5)
ALT: 11 U/L (ref 6–29)
AST: 14 U/L (ref 10–35)
Albumin: 4.2 g/dL (ref 3.6–5.1)
Alkaline phosphatase (APISO): 77 U/L (ref 37–153)
BUN: 21 mg/dL (ref 7–25)
CO2: 28 mmol/L (ref 20–32)
Calcium: 10.1 mg/dL (ref 8.6–10.4)
Chloride: 105 mmol/L (ref 98–110)
Creat: 0.84 mg/dL (ref 0.50–0.99)
GFR, Est African American: 84 mL/min/{1.73_m2} (ref >=60)
GFR, Est Non African American: 72 mL/min/{1.73_m2} (ref >=60)
Globulin: 2.6 g/dL (calc) (ref 1.9–3.7)
Glucose, Bld: 93 mg/dL (ref 65–99)
Potassium: 4.6 mmol/L (ref 3.5–5.3)
Sodium: 141 mmol/L (ref 135–146)
Total Bilirubin: 0.2 mg/dL (ref 0.2–1.2)
Total Protein: 6.8 g/dL (ref 6.1–8.1)

## 2020-03-01 LAB — HEMOGLOBIN A1C
Hgb A1c MFr Bld: 5.7 % of total Hgb — ABNORMAL HIGH (ref <5.7)
Mean Plasma Glucose: 117 (calc)
eAG (mmol/L): 6.5 (calc)

## 2020-03-01 LAB — LIPID PANEL
Cholesterol: 172 mg/dL (ref <200)
HDL: 56 mg/dL (ref >=50)
LDL Cholesterol (Calc): 97 mg/dL (calc)
Non-HDL Cholesterol (Calc): 116 mg/dL (calc) (ref <130)
Total CHOL/HDL Ratio: 3.1 (calc) (ref <5.0)
Triglycerides: 97 mg/dL (ref <150)

## 2020-03-19 ENCOUNTER — Encounter: Payer: Self-pay | Admitting: Family Medicine

## 2020-03-19 ENCOUNTER — Other Ambulatory Visit: Payer: Self-pay | Admitting: Family Medicine

## 2020-03-20 ENCOUNTER — Other Ambulatory Visit: Payer: Self-pay | Admitting: Family Medicine

## 2020-03-20 DIAGNOSIS — F411 Generalized anxiety disorder: Secondary | ICD-10-CM

## 2020-03-20 MED ORDER — BUSPIRONE HCL 5 MG PO TABS: 5.0000 mg | ORAL_TABLET | Freq: Two times a day (BID) | ORAL | 1 refills | Status: DC

## 2020-03-26 ENCOUNTER — Other Ambulatory Visit: Payer: Self-pay

## 2020-03-26 ENCOUNTER — Encounter (INDEPENDENT_AMBULATORY_CARE_PROVIDER_SITE_OTHER): Payer: Self-pay | Admitting: Vascular Surgery

## 2020-03-26 ENCOUNTER — Ambulatory Visit (INDEPENDENT_AMBULATORY_CARE_PROVIDER_SITE_OTHER): Payer: Medicare PPO | Admitting: Vascular Surgery

## 2020-03-26 VITALS — BP 150/92 | HR 101 | Ht 60.0 in | Wt 127.0 lb

## 2020-03-26 DIAGNOSIS — K2100 Gastro-esophageal reflux disease with esophagitis, without bleeding: Secondary | ICD-10-CM

## 2020-03-26 DIAGNOSIS — M79605 Pain in left leg: Secondary | ICD-10-CM

## 2020-03-26 DIAGNOSIS — J410 Simple chronic bronchitis: Secondary | ICD-10-CM

## 2020-03-26 DIAGNOSIS — E785 Hyperlipidemia, unspecified: Secondary | ICD-10-CM

## 2020-04-08 ENCOUNTER — Encounter (INDEPENDENT_AMBULATORY_CARE_PROVIDER_SITE_OTHER): Payer: Self-pay | Admitting: Vascular Surgery

## 2020-04-08 DIAGNOSIS — M79606 Pain in leg, unspecified: Secondary | ICD-10-CM | POA: Insufficient documentation

## 2020-04-08 NOTE — Progress Notes (Signed)
MRN : [id number]  Kristine Lowe is a 64 y.o. (1956-08-12) female who presents with chief complaint of  Chief Complaint  Patient presents with   New Patient (Initial Visit)    LE Calf pain  .  History of Present Illness:   The patient is seen for evaluation of painful lower extremities. Patient notes the pain is variable and not always associated with activity.  The pain is somewhat consistent day to day occurring on most days. The patient notes the pain also occurs with standing and routinely seems worse as the day wears on. The pain has been progressive over the past several years. The patient states these symptoms are causing  a profound negative impact on quality of life and daily activities.  The patient denies rest pain or dangling of an extremity off the side of the bed during the night for relief. No open wounds or sores at this time. No history of DVT or phlebitis. No prior interventions or surgeries.  There is a  history of back problems and DJD of the lumbar and sacral spine.    Current Meds  Medication Sig   albuterol (PROAIR HFA) 108 (90 Base) MCG/ACT inhaler Inhale 2 puffs into the lungs every 6 (six) hours as needed for wheezing or shortness of breath.   busPIRone (BUSPAR) 5 MG tablet Take 1 tablet (5 mg total) by mouth 2 (two) times daily.   Cholecalciferol (VITAMIN D) 2000 units CAPS Take 1 capsule (2,000 Units total) by mouth daily.   fluticasone (FLONASE) 50 MCG/ACT nasal spray Use 2 spray(s) in each nostril once daily   losartan (COZAAR) 50 MG tablet Take 1 tablet (50 mg total) by mouth daily.   meclizine (ANTIVERT) 12.5 MG tablet Take 1 tablet (12.5 mg total) by mouth 3 (three) times daily as needed for dizziness.   rosuvastatin (CRESTOR) 10 MG tablet Take 1 tablet (10 mg total) by mouth daily.    Past Medical History:  Diagnosis Date   Allergy    Anxiety    Chronic obstructive airway disease (HCC)    Dizziness and giddiness     Esophageal reflux    Insomnia    Lumbago    Lumbosacral neuritis    Pure hypercholesterolemia     Past Surgical History:  Procedure Laterality Date   BREAST EXCISIONAL BIOPSY Left 1996   neg    Social History Social History   Tobacco Use   Smoking status: Current Every Day Smoker    Packs/day: 0.25    Years: 38.00    Pack years: 9.50    Types: Cigarettes   Smokeless tobacco: Never Used  Building services engineer Use: Former  Substance Use Topics   Alcohol use: No    Alcohol/week: 0.0 standard drinks   Drug use: No    Family History Family History  Problem Relation Age of Onset   Breast cancer Mother 86   Alzheimer's disease Mother    Breast cancer Maternal Aunt 60   Breast cancer Paternal Grandmother   No family history of bleeding/clotting disorders, porphyria or autoimmune disease   Allergies  Allergen Reactions   Penicillins Swelling   Simvastatin    Zoloft [Sertraline Hcl] Other (See Comments)    Shop lift     REVIEW OF SYSTEMS (Negative unless checked)  Constitutional: [] Weight loss  [] Fever  [] Chills Cardiac: [] Chest pain   [] Chest pressure   [] Palpitations   [] Shortness of breath when laying flat   [] Shortness of breath  with exertion. Vascular:  [x] Pain in legs with walking   [x] Pain in legs at rest  [] History of DVT   [] Phlebitis   [] Swelling in legs   [] Varicose veins   [] Non-healing ulcers Pulmonary:   [] Uses home oxygen   [] Productive cough   [] Hemoptysis   [] Wheeze  [] COPD   [] Asthma Neurologic:  [] Dizziness   [] Seizures   [] History of stroke   [] History of TIA  [] Aphasia   [] Vissual changes   [] Weakness or numbness in arm   [] Weakness or numbness in leg Musculoskeletal:   [] Joint swelling   [] Joint pain   [] Low back pain Hematologic:  [] Easy bruising  [] Easy bleeding   [] Hypercoagulable state   [] Anemic Gastrointestinal:  [] Diarrhea   [] Vomiting  [] Gastroesophageal reflux/heartburn   [] Difficulty swallowing. Genitourinary:  [] Chronic  kidney disease   [] Difficult urination  [] Frequent urination   [] Blood in urine Skin:  [] Rashes   [] Ulcers  Psychological:  [] History of anxiety   []  History of major depression.  Physical Examination  Vitals:   03/26/20 1129  BP: (!) 150/92  Pulse: (!) 101  Weight: 127 lb (57.6 kg)  Height: 5' (1.524 m)   Body mass index is 24.8 kg/m. Gen: WD/WN, NAD Head: [place]/AT, No temporalis wasting.  Ear/Nose/Throat: Hearing grossly intact, nares w/o erythema or drainage, poor dentition Eyes: PER, EOMI, sclera nonicteric.  Neck: Supple, no masses.  No bruit or JVD.  Pulmonary:  Good air movement, clear to auscultation bilaterally, no use of accessory muscles.  Cardiac: RRR, normal S1, S2, no Murmurs. Vascular:  Vessel Right Left  Radial Palpable Palpable  PT Not Palpable Not Palpable  DP Not Palpable Not Palpable  Gastrointestinal: soft, non-distended. No guarding/no peritoneal signs.  Musculoskeletal: M/S 5/5 throughout.  No deformity or atrophy.  Neurologic: CN 2-12 intact. Pain and light touch intact in extremities.  Symmetrical.  Speech is fluent. Motor exam as listed above. Psychiatric: Judgment intact, Mood & affect appropriate for pt's clinical situation. Dermatologic: No rashes or ulcers noted.  No changes consistent with cellulitis.  CBC Lab Results  Component Value Date   WBC 6.5 02/29/2020   HGB 14.2 02/29/2020   HCT 44.7 02/29/2020   MCV 89.8 02/29/2020   PLT 340 02/29/2020    BMET    Component Value Date/Time   NA 141 02/29/2020 1212   NA 142 02/20/2014 1235   K 4.6 02/29/2020 1212   K 3.5 02/20/2014 1235   CL 105 02/29/2020 1212   CL 108 (H) 02/20/2014 1235   CO2 28 02/29/2020 1212   CO2 26 02/20/2014 1235   GLUCOSE 93 02/29/2020 1212   GLUCOSE 85 02/20/2014 1235   BUN 21 02/29/2020 1212   BUN 22 (H) 02/20/2014 1235   CREATININE 0.84 02/29/2020 1212   CALCIUM 10.1 02/29/2020 1212   CALCIUM 9.1 02/20/2014 1235   GFRNONAA 72 02/29/2020 1212   GFRAA 84  02/29/2020 1212   CrCl cannot be calculated (Patient's most recent lab result is older than the maximum 21 days allowed.).  COAG No results found for: INR, PROTIME  Radiology No results found.   Assessment/Plan 1. Pain of left lower extremity  Recommend:  The patient has atypical pain symptoms for pure atherosclerotic disease. However, on physical exam there is evidence of mixed venous and arterial disease, given the diminished pulses and the edema associated with venous changes of the legs.  Noninvasive study including ABI's of the legs will be obtained and the patient will follow up with me to review  these studies.  I suspect the patient is c/o pseudoclaudication.  Patient should have an evaluation of his LS spine which I defer to the primary service.  The patient should continue walking and begin a more formal exercise program. The patient should continue his antiplatelet therapy and aggressive treatment of the lipid abnormalities.  The patient should begin wearing graduated compression socks 15-20 mmHg strength to control edema.   - VAS [place] ABI WITH/WO TBI; Future  2. Simple chronic bronchitis (HCC) Continue pulmonary medications and aerosols as already ordered, these medications have been reviewed and there are no changes at this time.    3. Gastroesophageal reflux disease with esophagitis, unspecified whether hemorrhage Continue PPI as already ordered, this medication has been reviewed and there are no changes at this time.  Avoidence of caffeine and alcohol  Moderate elevation of the head of the bed   4. Dyslipidemia Continue statin as ordered and reviewed, no changes at this time     Levora Dredge, MD  04/08/2020 1:28 PM

## 2020-04-29 ENCOUNTER — Other Ambulatory Visit: Payer: Self-pay | Admitting: Family Medicine

## 2020-04-29 DIAGNOSIS — E785 Hyperlipidemia, unspecified: Secondary | ICD-10-CM

## 2020-04-29 DIAGNOSIS — I10 Essential (primary) hypertension: Secondary | ICD-10-CM

## 2020-05-21 ENCOUNTER — Other Ambulatory Visit: Payer: Self-pay | Admitting: Family Medicine

## 2020-05-21 ENCOUNTER — Telehealth: Payer: Self-pay | Admitting: Family Medicine

## 2020-05-21 DIAGNOSIS — F411 Generalized anxiety disorder: Secondary | ICD-10-CM

## 2020-05-21 MED ORDER — BUSPIRONE HCL 5 MG PO TABS: 5.0000 mg | ORAL_TABLET | Freq: Three times a day (TID) | ORAL | 0 refills | Status: DC

## 2020-05-21 NOTE — Telephone Encounter (Signed)
Robin pharmacist with [facility] is calling to let dr Carlynn Purl know pt is taking buspar 5 mg 3 to 4 times a day instead of twice a day. Pt is out. Pt needs a refill. Please advise

## 2020-05-24 ENCOUNTER — Other Ambulatory Visit: Payer: Self-pay

## 2020-05-24 ENCOUNTER — Ambulatory Visit (INDEPENDENT_AMBULATORY_CARE_PROVIDER_SITE_OTHER): Payer: Medicare PPO

## 2020-05-24 VITALS — BP 114/80 | HR 103 | Temp 98.3°F | Resp 16 | Ht 60.0 in | Wt 127.4 lb

## 2020-05-24 DIAGNOSIS — R319 Hematuria, unspecified: Secondary | ICD-10-CM | POA: Diagnosis not present

## 2020-05-24 DIAGNOSIS — Z23 Encounter for immunization: Secondary | ICD-10-CM

## 2020-05-24 DIAGNOSIS — R350 Frequency of micturition: Secondary | ICD-10-CM

## 2020-05-24 DIAGNOSIS — Z0000 Encounter for general adult medical examination without abnormal findings: Secondary | ICD-10-CM

## 2020-05-24 DIAGNOSIS — R0981 Nasal congestion: Secondary | ICD-10-CM

## 2020-05-24 LAB — POCT URINALYSIS DIPSTICK
Appearance: NORMAL
Bilirubin, UA: NEGATIVE
Glucose, UA: NEGATIVE
Ketones, UA: NEGATIVE
Leukocytes, UA: NEGATIVE
Nitrite, UA: NEGATIVE
Protein, UA: NEGATIVE
Spec Grav, UA: 1.015 (ref 1.010–1.025)
Urobilinogen, UA: 0.2 E.U./dL
pH, UA: 6 (ref 5.0–8.0)

## 2020-05-24 NOTE — Patient Instructions (Signed)
Kristine Lowe , Thank you for taking time to come for your Medicare Wellness Visit. I appreciate your ongoing commitment to your health goals. Please review the following plan we discussed and let me know if I can assist you in the future.   Screening recommendations/referrals: Colonoscopy: declined  Mammogram: done 09/20/19. Due 09/20/20. Bone Density: done 09/20/19 Recommended yearly ophthalmology/optometry visit for glaucoma screening and checkup Recommended yearly dental visit for hygiene and checkup  Vaccinations: Influenza vaccine: done 07/26/19 Pneumococcal vaccine: done today Tdap vaccine: due Shingles vaccine: Shingrix discussed. Please contact your pharmacy for coverage information.  Covid-19:done 11/17/19 & 12/15/19  Advanced directives: Advance directive discussed with you today. Even though you declined this today please call our office should you change your mind and we can give you the proper paperwork for you to fill out.  Conditions/risks identified: Recommend increasing physical activity   Next appointment: Follow up in one year for your annual wellness visit    Preventive Care 65 Years and Older, Female Preventive care refers to lifestyle choices and visits with your health care provider that can promote health and wellness. What does preventive care include?  A yearly physical exam. This is also called an annual well check.  Dental exams once or twice a year.  Routine eye exams. Ask your health care provider how often you should have your eyes checked.  Personal lifestyle choices, including:  Daily care of your teeth and gums.  Regular physical activity.  Eating a healthy diet.  Avoiding tobacco and drug use.  Limiting alcohol use.  Practicing safe sex.  Taking low-dose aspirin every day.  Taking vitamin and mineral supplements as recommended by your health care provider. What happens during an annual well check? The services and screenings done  by your health care provider during your annual well check will depend on your age, overall health, lifestyle risk factors, and family history of disease. Counseling  Your health care provider may ask you questions about your:  Alcohol use.  Tobacco use.  Drug use.  Emotional well-being.  Home and relationship well-being.  Sexual activity.  Eating habits.  History of falls.  Memory and ability to understand (cognition).  Work and work Astronomer.  Reproductive health. Screening  You may have the following tests or measurements:  Height, weight, and BMI.  Blood pressure.  Lipid and cholesterol levels. These may be checked every 5 years, or more frequently if you are over 56 years old.  Skin check.  Lung cancer screening. You may have this screening every year starting at age 37 if you have a 30-pack-year history of smoking and currently smoke or have quit within the past 15 years.  Fecal occult blood test (FOBT) of the stool. You may have this test every year starting at age 58.  Flexible sigmoidoscopy or colonoscopy. You may have a sigmoidoscopy every 5 years or a colonoscopy every 10 years starting at age 40.  Hepatitis C blood test.  Hepatitis B blood test.  Sexually transmitted disease (STD) testing.  Diabetes screening. This is done by checking your blood sugar (glucose) after you have not eaten for a while (fasting). You may have this done every 1-3 years.  Bone density scan. This is done to screen for osteoporosis. You may have this done starting at age 80.  Mammogram. This may be done every 1-2 years. Talk to your health care provider about how often you should have regular mammograms. Talk with your health care provider about your test results,  treatment options, and if necessary, the need for more tests. Vaccines  Your health care provider may recommend certain vaccines, such as:  Influenza vaccine. This is recommended every year.  Tetanus,  diphtheria, and acellular pertussis (Tdap, Td) vaccine. You may need a Td booster every 10 years.  Zoster vaccine. You may need this after age 45.  Pneumococcal 13-valent conjugate (PCV13) vaccine. One dose is recommended after age 53.  Pneumococcal polysaccharide (PPSV23) vaccine. One dose is recommended after age 80. Talk to your health care provider about which screenings and vaccines you need and how often you need them. This information is not intended to replace advice given to you by your health care provider. Make sure you discuss any questions you have with your health care provider. Document Released: 10/19/2015 Document Revised: 06/11/2016 Document Reviewed: 07/24/2015 Elsevier Interactive Patient Education  2017 [employer] Prevention in the Home Falls can cause injuries. They can happen to people of all ages. There are many things you can do to make your home safe and to help prevent falls. What can I do on the outside of my home?  Regularly fix the edges of walkways and driveways and fix any cracks.  Remove anything that might make you trip as you walk through a door, such as a raised step or threshold.  Trim any bushes or trees on the path to your home.  Use bright outdoor lighting.  Clear any walking paths of anything that might make someone trip, such as rocks or tools.  Regularly check to see if handrails are loose or broken. Make sure that both sides of any steps have handrails.  Any raised decks and porches should have guardrails on the edges.  Have any leaves, snow, or ice cleared regularly.  Use sand or salt on walking paths during winter.  Clean up any spills in your garage right away. This includes oil or grease spills. What can I do in the bathroom?  Use night lights.  Install grab bars by the toilet and in the tub and shower. Do not use towel bars as grab bars.  Use non-skid mats or decals in the tub or shower.  If you need to sit down in  the shower, use a plastic, non-slip stool.  Keep the floor dry. Clean up any water that spills on the floor as soon as it happens.  Remove soap buildup in the tub or shower regularly.  Attach bath mats securely with double-sided non-slip rug tape.  Do not have throw rugs and other things on the floor that can make you trip. What can I do in the bedroom?  Use night lights.  Make sure that you have a light by your bed that is easy to reach.  Do not use any sheets or blankets that are too big for your bed. They should not hang down onto the floor.  Have a firm chair that has side arms. You can use this for support while you get dressed.  Do not have throw rugs and other things on the floor that can make you trip. What can I do in the kitchen?  Clean up any spills right away.  Avoid walking on wet floors.  Keep items that you use a lot in easy-to-reach places.  If you need to reach something above you, use a strong step stool that has a grab bar.  Keep electrical cords out of the way.  Do not use floor polish or wax that makes floors  slippery. If you must use wax, use non-skid floor wax.  Do not have throw rugs and other things on the floor that can make you trip. What can I do with my stairs?  Do not leave any items on the stairs.  Make sure that there are handrails on both sides of the stairs and use them. Fix handrails that are broken or loose. Make sure that handrails are as long as the stairways.  Check any carpeting to make sure that it is firmly attached to the stairs. Fix any carpet that is loose or worn.  Avoid having throw rugs at the top or bottom of the stairs. If you do have throw rugs, attach them to the floor with carpet tape.  Make sure that you have a light switch at the top of the stairs and the bottom of the stairs. If you do not have them, ask someone to add them for you. What else can I do to help prevent falls?  Wear shoes that:  Do not have high  heels.  Have rubber bottoms.  Are comfortable and fit you well.  Are closed at the toe. Do not wear sandals.  If you use a stepladder:  Make sure that it is fully opened. Do not climb a closed stepladder.  Make sure that both sides of the stepladder are locked into place.  Ask someone to hold it for you, if possible.  Clearly mark and make sure that you can see:  Any grab bars or handrails.  First and last steps.  Where the edge of each step is.  Use tools that help you move around (mobility aids) if they are needed. These include:  Canes.  Walkers.  Scooters.  Crutches.  Turn on the lights when you go into a dark area. Replace any light bulbs as soon as they burn out.  Set up your furniture so you have a clear path. Avoid moving your furniture around.  If any of your floors are uneven, fix them.  If there are any pets around you, be aware of where they are.  Review your medicines with your doctor. Some medicines can make you feel dizzy. This can increase your chance of falling. Ask your doctor what other things that you can do to help prevent falls. This information is not intended to replace advice given to you by your health care provider. Make sure you discuss any questions you have with your health care provider. Document Released: 07/19/2009 Document Revised: 02/28/2016 Document Reviewed: 10/27/2014 Elsevier Interactive Patient Education  2017 [employer]  Pneumococcal Polysaccharide Vaccine (PPSV23): What You Need to Know 1. Why get vaccinated? Pneumococcal polysaccharide vaccine (PPSV23) can prevent pneumococcal disease. Pneumococcal disease refers to any illness caused by pneumococcal bacteria. These bacteria can cause many types of illnesses, including pneumonia, which is an infection of the lungs. Pneumococcal bacteria are one of the most common causes of pneumonia. Besides pneumonia, pneumococcal bacteria can also cause:  Ear infections  Sinus  infections  Meningitis (infection of the tissue covering the brain and spinal cord)  Bacteremia (bloodstream infection) Anyone can get pneumococcal disease, but children under 62 years of age, people with certain medical conditions, adults 65 years or older, and cigarette smokers are at the highest risk. Most pneumococcal infections are mild. However, some can result in long-term problems, such as brain damage or hearing loss. Meningitis, bacteremia, and pneumonia caused by pneumococcal disease can be fatal. 2. PPSV23 PPSV23 protects against 23 types of bacteria that  cause pneumococcal disease. PPSV23 is recommended for:  All adults 65 years or older,  Anyone 2 years or older with certain medical conditions that can lead to an increased risk for pneumococcal disease. Most people need only one dose of PPSV23. A second dose of PPSV23, and another type of pneumococcal vaccine called PCV13, are recommended for certain high-risk groups. Your health care provider can give you more information. People 65 years or older should get a dose of PPSV23 even if they have already gotten one or more doses of the vaccine before they turned 42. 3. Talk with your health care provider Tell your vaccine provider if the person getting the vaccine:  Has had an allergic reaction after a previous dose of PPSV23, or has any severe, life-threatening allergies. In some cases, your health care provider may decide to postpone PPSV23 vaccination to a future visit. People with minor illnesses, such as a cold, may be vaccinated. People who are moderately or severely ill should usually wait until they recover before getting PPSV23. Your health care provider can give you more information. 4. Risks of a vaccine reaction  Redness or pain where the shot is given, feeling tired, fever, or muscle aches can happen after PPSV23. People sometimes faint after medical procedures, including vaccination. Tell your provider if you feel  dizzy or have vision changes or ringing in the ears. As with any medicine, there is a very remote chance of a vaccine causing a severe allergic reaction, other serious injury, or death. 5. What if there is a serious problem? An allergic reaction could occur after the vaccinated person leaves the clinic. If you see signs of a severe allergic reaction (hives, swelling of the face and throat, difficulty breathing, a fast heartbeat, dizziness, or weakness), call 9-1-1 and get the person to the nearest hospital. For other signs that concern you, call your health care provider. Adverse reactions should be reported to the Vaccine Adverse Event Reporting System (VAERS). Your health care provider will usually file this report, or you can do it yourself. Visit the VAERS website at www.vaers.LAgents.no or call 312 386 5803. VAERS is only for reporting reactions, and VAERS staff do not give medical advice. 6. How can I learn more?  Ask your health care provider.  Call your local or state health department.  Contact the Centers for Disease Control and Prevention (CDC): ? Call 214 049 1524 (1-800-CDC-INFO) or ? Visit CDC's website at PicCapture.uy CDC Vaccine Information Statement PPSV23 Vaccine (08/04/2018) This information is not intended to replace advice given to you by your health care provider. Make sure you discuss any questions you have with your health care provider. Document Revised: 01/11/2019 Document Reviewed: 05/04/2018 Elsevier Patient Education  2020 [employer].

## 2020-05-24 NOTE — Progress Notes (Addendum)
Subjective:   Kristine Lowe is a 64 y.o. female who presents for an Initial Medicare Annual Wellness Visit.  Review of Systems     Cardiac Risk Factors include: advanced age (>19men, >64 women);dyslipidemia;hypertension     Objective:    Today's Vitals   05/24/20 1106  BP: 114/80  Pulse: (!) 103  Resp: 16  Temp: 98.3 F (36.8 C)  TempSrc: Oral  SpO2: 98%  Weight: 127 lb 6.4 oz (57.8 kg)  Height: 5' (1.524 m)   Body mass index is 24.88 kg/m.  Advanced Directives 05/24/2020 05/11/2019 02/11/2017 02/15/2016 10/11/2015  Does Patient Have a Medical Advance Directive? No No No No No  Would patient like information on creating a medical advance directive? No - Patient declined No - Patient declined No - Patient declined No - patient declined information No - patient declined information    Current Medications (verified) Outpatient Encounter Medications as of 05/24/2020  Medication Sig   albuterol (PROAIR HFA) 108 (90 Base) MCG/ACT inhaler Inhale 2 puffs into the lungs every 6 (six) hours as needed for wheezing or shortness of breath.   busPIRone (BUSPAR) 5 MG tablet Take 1 tablet (5 mg total) by mouth 3 (three) times daily. Most of 3 times daily   Cholecalciferol (VITAMIN D) 2000 units CAPS Take 1 capsule (2,000 Units total) by mouth daily.   fluticasone (FLONASE) 50 MCG/ACT nasal spray Use 2 spray(s) in each nostril once daily   losartan (COZAAR) 50 MG tablet Take 1 tablet (50 mg total) by mouth daily.   meclizine (ANTIVERT) 12.5 MG tablet Take 1 tablet (12.5 mg total) by mouth 3 (three) times daily as needed for dizziness.   rosuvastatin (CRESTOR) 10 MG tablet Take 1 tablet (10 mg total) by mouth daily.   No facility-administered encounter medications on file as of 05/24/2020.    Allergies (verified) Penicillins, Simvastatin, and Zoloft [sertraline hcl]   History: Past Medical History:  Diagnosis Date   Allergy    Anxiety    Chronic obstructive airway  disease (HCC)    Dizziness and giddiness    Esophageal reflux    Insomnia    Lumbago    Lumbosacral neuritis    Pure hypercholesterolemia    Past Surgical History:  Procedure Laterality Date   BREAST EXCISIONAL BIOPSY Left 1996   neg   Family History  Problem Relation Age of Onset   Breast cancer Mother 56   Alzheimer's disease Mother    Kidney disease Mother    Breast cancer Maternal Aunt 24   Breast cancer Paternal Grandmother    Polycythemia Maternal Grandmother    Social History   Socioeconomic History   Marital status: Married    Spouse name: Not on file   Number of children: 7   Years of education: Not on file   Highest education level: Some college, no degree  Occupational History   Occupation: member Teaching laboratory technician   Tobacco Use   Smoking status: Current Every Day Smoker    Packs/day: 0.25    Years: 38.00    Pack years: 9.50    Types: Cigarettes   Smokeless tobacco: Never Used   Tobacco comment: smoing cessation class info provided  Vaping Use   Vaping Use: Former  Substance and Sexual Activity   Alcohol use: No    Alcohol/week: 0.0 standard drinks   Drug use: No   Sexual activity: Not Currently  Other Topics Concern   Not on file  Social History Narrative   Married ,  has 7 grown children, working part time   International aid/development worker of Corporate investment banker Strain: Low Risk    Difficulty of Paying Living Expenses: Not hard at all  Food Insecurity: No Food Insecurity   Worried About Programme researcher, broadcasting/film/video in the Last Year: Never true   Barista in the Last Year: Never true  Transportation Needs: No Transportation Needs   Lack of Transportation (Medical): No   Lack of Transportation (Non-Medical): No  Physical Activity: Inactive   Days of Exercise per Week: 0 days   Minutes of Exercise per Session: 0 min  Stress: Stress Concern Present   Feeling of Stress : To some extent  Social Connections: Moderately  Integrated   Frequency of Communication with Friends and Family: More than three times a week   Frequency of Social Gatherings with Friends and Family: More than three times a week   Attends Religious Services: More than 4 times per year   Active Member of [employer] or Organizations: No   Attends Engineer, structural: Never   Marital Status: Married    Tobacco Counseling Ready to quit: Yes Counseling given: Yes Comment: smoing cessation class info provided   Clinical Intake:  Pre-visit preparation completed: Yes  Pain : No/denies pain     BMI - recorded: 24.88 Nutritional Status: BMI of 19-24  Normal Nutritional Risks: None Diabetes: No  How often do you need to have someone help you when you read instructions, pamphlets, or other written materials from your doctor or pharmacy?: 1 - Never    Interpreter Needed?: No  Information entered by :: Reather Littler LPN   Activities of Daily Living In your present state of health, do you have any difficulty performing the following activities: 05/24/2020 02/29/2020  Hearing? N N  Comment declines hearing aids -  Vision? N Y  Comment - Appointment in June with eye care professional  Difficulty concentrating or making decisions? N N  Walking or climbing stairs? N N  Dressing or bathing? N N  Doing errands, shopping? N N  Preparing Food and eating ? N -  Using the Toilet? N -  In the past six months, have you accidently leaked urine? Y -  Comment wears pads for protection -  Do you have problems with loss of bowel control? N -  Managing your Medications? N -  Managing your Finances? N -  Housekeeping or managing your Housekeeping? N -  Some recent data might be hidden    Patient Care Team: Alba Cory, MD as PCP - General (Family Medicine)  Indicate any recent Medical Services you may have received from other than [facility] providers in the past year (date may be approximate).     Assessment:   This is a  routine wellness examination for [name].  Hearing/Vision screen  Hearing Screening   125Hz  250Hz  500Hz  1000Hz  2000Hz  3000Hz  4000Hz  6000Hz  8000Hz   Right ear:           Left ear:           Comments: Pt denies hearing difficulty   Vision Screening Comments: Annual vision screenings with Dr.  Dietary issues and exercise activities discussed: Current Exercise Habits: The patient does not participate in regular exercise at present, Exercise limited by: None identified  Goals   None    Depression Screen PHQ 2/9 Scores 05/24/2020 02/29/2020 12/02/2019 07/26/2019 04/22/2019 02/13/2017 07/24/2016  PHQ - 2 Score 0 0 0 0 0 0 0  PHQ- 9 Score - 0 0 1 0 - -    Fall Risk Fall Risk  05/24/2020 12/02/2019 07/26/2019 04/22/2019 02/13/2017  Falls in the past year? 0 0 0 0 No  Number falls in past yr: 0 0 0 0 -  Injury with Fall? 0 0 0 0 -  Risk for fall due to : No Fall Risks - - - -  Follow up Falls prevention discussed - - - -    Any stairs in or around the home? Yes  If so, are there any without handrails? No  Home free of loose throw rugs in walkways, pet beds, electrical cords, etc? Yes  Adequate lighting in your home to reduce risk of falls? Yes   ASSISTIVE DEVICES UTILIZED TO PREVENT FALLS:  Life alert? No  Use of a cane, walker or w/c? No  Grab bars in the bathroom? Yes  Shower chair or bench in shower? No  Elevated toilet seat or a handicapped toilet? No   TIMED UP AND GO:  Was the test performed? Yes .  Length of time to ambulate 10 feet: 5 sec.   Gait steady and fast without use of assistive device  Cognitive Function: 6CIT deferred for 2021 AWV; pt has no memory issues.         Immunizations Immunization History  Administered Date(s) Administered   Fluad Quad(high Dose 65+) 07/26/2019   Moderna SARS-COVID-2 Vaccination 11/17/2019, 12/15/2019   Pneumococcal Conjugate-13 04/26/2019   Pneumococcal Polysaccharide-23 05/24/2020    TDAP status: Due, Education  has been provided regarding the importance of this vaccine. Advised may receive this vaccine at local pharmacy or Health Dept. Aware to provide a copy of the vaccination record if obtained from local pharmacy or Health Dept. Verbalized acceptance and understanding.   Flu Vaccine status: Up to date   Pneumococcal vaccine status: Completed during today's visit.   Covid-19 vaccine status: Completed vaccines  Qualifies for Shingles Vaccine? Yes   Zostavax completed No   Shingrix Completed?: No.    Education has been provided regarding the importance of this vaccine. Patient has been advised to call insurance company to determine out of pocket expense if they have not yet received this vaccine. Advised may also receive vaccine at local pharmacy or Health Dept. Verbalized acceptance and understanding.  Screening Tests Health Maintenance  Topic Date Due   INFLUENZA VACCINE  05/06/2020   TETANUS/TDAP  07/25/2020 (Originally 02/05/1975)   COLONOSCOPY  02/28/2021 (Originally 02/06/2004)   MAMMOGRAM  09/19/2021   DEXA SCAN  Completed   COVID-19 Vaccine  Completed   Hepatitis C Screening  Completed   PNA vac Low Risk Adult  Completed    Health Maintenance  Health Maintenance Due  Topic Date Due   INFLUENZA VACCINE  05/06/2020    Colorectal Cancer screening status: pt declined  Mammogram status: Completed 09/20/19. Repeat every year   Bone Density status: Completed 09/20/19. Results reflect: Bone density results: OSTEOPENIA. Repeat every 2 years.  Lung Cancer Screening: (Low Dose CT Chest recommended if Age 57-80 years, 30 pack-year currently smoking OR have quit w/in 15years.) does not qualify.   Additional Screening:  Hepatitis C Screening: does qualify; Completed 04/22/19  Vision Screening: Recommended annual ophthalmology exams for early detection of glaucoma and other disorders of the eye. Is the patient up to date with their annual eye exam?  Yes  Who is the provider or what  is the name of the office in which the patient attends annual eye exams?  Dr. Santiago Bumpershurmond  Dental Screening: Recommended annual dental exams for proper oral hygiene  Community Resource Referral / Chronic Care Management: CRR required this visit?  No   CCM required this visit?  No      Plan:     I have personally reviewed and noted the following in the patients chart:    Medical and social history  Use of alcohol, tobacco or illicit drugs   Current medications and supplements  Functional ability and status  Nutritional status  Physical activity  Advanced directives  List of other physicians  Hospitalizations, surgeries, and ER visits in previous 12 months  Vitals  Screenings to include cognitive, depression, and falls  Referrals and appointments  In addition, I have reviewed and discussed with patient certain preventive protocols, quality metrics, and best practice recommendations. A written personalized care plan for preventive services as well as general preventive health recommendations were provided to patient.     Reather LittlerKasey Querida Beretta, LPN   7/32/20258/19/2021   Nurse Notes: pt c/o increased urinary frequency over the past few days. UA ordered and resulted with large blood in urine, culture sent per Dr. Carlynn PurlSowles.   Pt also c/o discoloration of toe nails and plans to use fungi nail and buffing nails, she has seen slight improvement in nail growth and color by keeping polish off of toes. Pt will follow up with Dr. Carlynn PurlSowles at [facility] on 06/26/20.   Patient request refill of fluticasone. Telephone encounter created by Freada BergeronSandra Somers, CMA.

## 2020-05-25 DIAGNOSIS — E7849 Other hyperlipidemia: Secondary | ICD-10-CM | POA: Diagnosis not present

## 2020-05-25 DIAGNOSIS — E876 Hypokalemia: Secondary | ICD-10-CM | POA: Diagnosis not present

## 2020-05-25 DIAGNOSIS — N390 Urinary tract infection, site not specified: Secondary | ICD-10-CM | POA: Diagnosis not present

## 2020-05-25 DIAGNOSIS — I129 Hypertensive chronic kidney disease with stage 1 through stage 4 chronic kidney disease, or unspecified chronic kidney disease: Secondary | ICD-10-CM | POA: Diagnosis not present

## 2020-05-25 DIAGNOSIS — S81012A Laceration without foreign body, left knee, initial encounter: Secondary | ICD-10-CM | POA: Diagnosis not present

## 2020-05-25 DIAGNOSIS — N183 Chronic kidney disease, stage 3 unspecified: Secondary | ICD-10-CM | POA: Diagnosis not present

## 2020-05-25 DIAGNOSIS — R531 Weakness: Secondary | ICD-10-CM | POA: Diagnosis not present

## 2020-05-25 DIAGNOSIS — S81812A Laceration without foreign body, left lower leg, initial encounter: Secondary | ICD-10-CM | POA: Diagnosis not present

## 2020-05-25 DIAGNOSIS — R4182 Altered mental status, unspecified: Secondary | ICD-10-CM | POA: Diagnosis not present

## 2020-05-25 DIAGNOSIS — M797 Fibromyalgia: Secondary | ICD-10-CM | POA: Diagnosis not present

## 2020-05-25 LAB — URINE CULTURE
MICRO NUMBER:: 10847683
Result:: NO GROWTH
SPECIMEN QUALITY:: ADEQUATE

## 2020-05-26 DIAGNOSIS — R4182 Altered mental status, unspecified: Secondary | ICD-10-CM | POA: Diagnosis not present

## 2020-05-27 MED ORDER — FLUTICASONE PROPIONATE 50 MCG/ACT NA SUSP: NASAL | 0 refills | Status: DC

## 2020-05-30 DIAGNOSIS — Z8673 Personal history of transient ischemic attack (TIA), and cerebral infarction without residual deficits: Secondary | ICD-10-CM | POA: Diagnosis not present

## 2020-05-30 DIAGNOSIS — E785 Hyperlipidemia, unspecified: Secondary | ICD-10-CM | POA: Diagnosis not present

## 2020-05-30 DIAGNOSIS — F0391 Unspecified dementia with behavioral disturbance: Secondary | ICD-10-CM | POA: Diagnosis not present

## 2020-05-30 DIAGNOSIS — R4182 Altered mental status, unspecified: Secondary | ICD-10-CM | POA: Diagnosis not present

## 2020-05-30 DIAGNOSIS — Z87891 Personal history of nicotine dependence: Secondary | ICD-10-CM | POA: Diagnosis not present

## 2020-05-30 DIAGNOSIS — G309 Alzheimer's disease, unspecified: Secondary | ICD-10-CM | POA: Diagnosis not present

## 2020-05-30 DIAGNOSIS — I129 Hypertensive chronic kidney disease with stage 1 through stage 4 chronic kidney disease, or unspecified chronic kidney disease: Secondary | ICD-10-CM | POA: Diagnosis not present

## 2020-05-30 DIAGNOSIS — F0281 Dementia in other diseases classified elsewhere with behavioral disturbance: Secondary | ICD-10-CM | POA: Diagnosis not present

## 2020-05-30 DIAGNOSIS — N189 Chronic kidney disease, unspecified: Secondary | ICD-10-CM | POA: Diagnosis not present

## 2020-05-30 DIAGNOSIS — M1990 Unspecified osteoarthritis, unspecified site: Secondary | ICD-10-CM | POA: Diagnosis not present

## 2020-05-30 DIAGNOSIS — Z86718 Personal history of other venous thrombosis and embolism: Secondary | ICD-10-CM | POA: Diagnosis not present

## 2020-06-04 ENCOUNTER — Ambulatory Visit (INDEPENDENT_AMBULATORY_CARE_PROVIDER_SITE_OTHER): Payer: Medicare PPO | Admitting: Vascular Surgery

## 2020-06-04 ENCOUNTER — Encounter (INDEPENDENT_AMBULATORY_CARE_PROVIDER_SITE_OTHER): Payer: Medicare PPO

## 2020-06-04 MED ORDER — BUSPIRONE HCL 5 MG PO TABS: 5.0000 mg | ORAL_TABLET | Freq: Three times a day (TID) | ORAL | 2 refills | Status: DC

## 2020-06-22 ENCOUNTER — Encounter: Payer: Self-pay | Admitting: Family Medicine

## 2020-06-22 NOTE — Progress Notes (Signed)
Patient: Kristine Lowe, Female    DOB: 04/07/1956, 64 y.o.   MRN: [id number] Steele Sizer, MD Visit Date: 06/26/2020  Today's Provider: Loistine Chance, MD   Chief Complaint  Patient presents with  . Annual Exam   Subjective:   Annual physical exam and follow up  Kristine Lowe is a 64 y.o. female who presents today for complete physical exam:  Exercise/Activity: none   Diet/nutrition:balanced meals, enough dairy but also likes junk   Sleep:   Pt wished to discuss acute complaints back pain and also routine f/up on chronic conditions today in addition to CPE. Advised pt of separate visit billing/coding  GAD:she used to take Citalopram but prefers to take alprazolam prn, a few times a week, but after we discussed BZD use, we tried hydroxyzine and she did not like it,we started on Buspar in October 2020 , and she states first bottle worked well, it was a Scientist, research (life sciences) however second rx was a Scientist, clinical (histocompatibility and immunogenetics) and she has noticed that does not work as well, she is back on Teva and is working well again, taking up to 3 times a day   Hematuria: found on urine dip, negative culture we will send to the lab if still positive we will get imaging studies or refer to urologist   Intermittent vertigo: going on for years, she states sometimes she gets up and walks towards a wall she denies spinning sensation, takes meclizine prn and wants a refill. Not associated with nausea or vomiting. Unchanged, still seldom   HTN: she used to take triamterene hctz,but she is down to HCTZ 12.5 mg since Summer 2020 and bp is at goal, no chest pain or palpitation, unless she is having anxiety attack. She has been more compliant with her medication since last visit , however on her last visit she was having urinary frequency and we switched to losartan, doing well since, bp is at goal today   Dyslipidemia: she has been taking Crestor since July 2020, discussed increasing dose of  statin but she states she skips medication at times and will try to do better   The 10-year ASCVD risk score Mikey Bussing DC Brooke Bonito., et al., 2013) is: 13%   Values used to calculate the score:     Age: 45 years     Sex: Female     Is Non-Hispanic African American: Yes     Diabetic: No     Tobacco smoker: Yes     Systolic Blood Pressure: 616 mmHg     Is BP treated: Yes     HDL Cholesterol: 56 mg/dL     Total Cholesterol: 172 mg/dL  Chronic Bronchitis: she smoked for many years, she quit /switched to vapor in 2014, but when she ran out of alprazolam a couple of years ago she resumed smoking 2019 . She is smoking less now, down to 3- 4 cigarettes daily, no cough, wheezing or sob. Not interested in CT chest or medication at this time   OSA: she never really likely wearing machine, discussed risk of pulmonary hypertension, she is not interested at this time.Same    Pre-diabetes: A1C stable at 57 %, she states she eats junk food but not a lot  No polyphagia, polydipsia , urinary frequency resolved.  GERD: occasional symptomsshe is taking Alka Seltzer prn , episodes are seldom now   Calf pain: seen by vascular surgeon since she has Medicare, she states she is doing better and did not go back.  USPSTF grade A and B recommendations - reviewed and addressed today  Depression:  Phq 9 completed today by patient, was reviewed by me with patient in the room PHQ score is negative PHQ 2/9 Scores 06/26/2020 05/24/2020 02/29/2020 12/02/2019  PHQ - 2 Score 0 0 0 0  PHQ- 9 Score 0 - 0 0   Depression screen [facility] 2/9 06/26/2020 05/24/2020 02/29/2020 12/02/2019 07/26/2019  Decreased Interest 0 0 0 0 0  Down, Depressed, Hopeless 0 0 0 0 0  PHQ - 2 Score 0 0 0 0 0  Altered sleeping 0 - 0 0 0  Tired, decreased energy 0 - 0 0 0  Change in appetite 0 - 0 0 0  Feeling bad or failure about yourself  0 - 0 0 0  Trouble concentrating 0 - 0 0 0  Moving slowly or fidgety/restless 0 - 0 0 1  Suicidal thoughts 0 - 0 0  0  PHQ-9 Score 0 - 0 0 1  Difficult doing work/chores - - - - Somewhat difficult    Alcohol screening:   Office Visit from 06/26/2020 in [facility]  AUDIT-C Score 0      Immunizations and Health Maintenance: Health Maintenance  Topic Date Due  . TETANUS/TDAP  07/25/2020 (Originally 02/05/1973)  . COLONOSCOPY  02/28/2021 (Originally 02/06/2004)  . MAMMOGRAM  09/19/2021  . INFLUENZA VACCINE  Completed  . DEXA SCAN  Completed  . COVID-19 Vaccine  Completed  . Hepatitis C Screening  Completed  . PNA vac Low Risk Adult  Completed     Hep C Screening: completed  STD testing and prevention (HIV/chl/gon/syphilis):  see above, no additional testing desired by pt today  Intimate partner violence: negative screen   Sexual History/Pain during Intercourse: Married  Menstrual History/LMP/Abnormal Bleeding: menopausal, discussed need to follow up if any post-menopausal bleeding   Incontinence Symptoms: she has some stress incontinence   Breast cancer:  Last Mammogram: completed BRCA gene screening: N/A  Cervical cancer screening: n/a Pt has family hx of cancers - breast, ovarian, uterine, colon:     Osteoporosis:   Discussion on osteoporosis per age,vitamin D supplementation, weight bearing exercises Pt is supplementing with vitamin D supplementation  Completed on 09/20/2019 Bone scan/dexa Roughly experienced menopause at age 21  Skin cancer:   Discussed atypical lesions   Colorectal cancer:  Patient declined Colonoscopy is due, refused  Discussed concerning signs and sx of CRC  Lung cancer:   Low Dose CT Chest recommended if Age 54-80 years, 30 pack-year currently smoking OR have quit w/in 15years. Patient does not qualify.    Social History   Tobacco Use  . Smoking status: Current Every Day Smoker    Packs/day: 0.25    Years: 38.00    Pack years: 9.50    Types: Cigarettes  . Smokeless tobacco: Never Used  . Tobacco comment: smoing cessation class  info provided  Vaping Use  . Vaping Use: Former  Substance Use Topics  . Alcohol use: No    Alcohol/week: 0.0 standard drinks  . Drug use: No       Office Visit from 06/26/2020 in [facility]  AUDIT-C Score 0      Family History  Problem Relation Age of Onset  . Breast cancer Mother 7  . Alzheimer's disease Mother   . Kidney disease Mother   . Breast cancer Maternal Aunt 60  . Breast cancer Paternal Grandmother   . Polycythemia Maternal Grandmother  Blood pressure/Hypertension: BP Readings from Last 3 Encounters:  06/26/20 116/80  05/24/20 114/80  03/26/20 (!) 150/92    Weight/Obesity: Wt Readings from Last 3 Encounters:  06/26/20 126 lb (57.2 kg)  05/24/20 127 lb 6.4 oz (57.8 kg)  03/26/20 127 lb (57.6 kg)   BMI Readings from Last 3 Encounters:  06/26/20 24.61 kg/m  05/24/20 24.88 kg/m  03/26/20 24.80 kg/m     Lipids:  Lab Results  Component Value Date   CHOL 172 02/29/2020   CHOL 176 07/26/2019   CHOL 259 (H) 04/22/2019   Lab Results  Component Value Date   HDL 56 02/29/2020   HDL 51 07/26/2019   HDL 45 (L) 04/22/2019   Lab Results  Component Value Date   LDLCALC 97 02/29/2020   LDLCALC 103 (H) 07/26/2019   LDLCALC 183 (H) 04/22/2019   Lab Results  Component Value Date   TRIG 97 02/29/2020   TRIG 119 07/26/2019   TRIG 163 (H) 04/22/2019   Lab Results  Component Value Date   CHOLHDL 3.1 02/29/2020   CHOLHDL 3.5 07/26/2019   CHOLHDL 5.8 (H) 04/22/2019   No results found for: LDLDIRECT Based on the results of lipid panel his/her cardiovascular risk factor ( using [place] )  in the next 10 years is: The 10-year ASCVD risk score Mikey Bussing DC Brooke Bonito., et al., 2013) is: 13%   Values used to calculate the score:     Age: 44 years     Sex: Female     Is Non-Hispanic African American: Yes     Diabetic: No     Tobacco smoker: Yes     Systolic Blood Pressure: 096 mmHg     Is BP treated: Yes     HDL Cholesterol: 56  mg/dL     Total Cholesterol: 172 mg/dL Glucose:  Glucose  Date Value Ref Range Status  02/20/2014 85 65 - 99 mg/dL Final  10/18/2011 94 65 - 99 mg/dL Final   Glucose, Bld  Date Value Ref Range Status  02/29/2020 93 65 - 99 mg/dL Final    Comment:    .            Fasting reference interval .   07/26/2019 88 65 - 99 mg/dL Final    Comment:    .            Fasting reference interval .   04/22/2019 90 65 - 99 mg/dL Final    Comment:    .            Fasting reference interval .    Hypertension: BP Readings from Last 3 Encounters:  06/26/20 116/80  05/24/20 114/80  03/26/20 (!) 150/92   Obesity: Wt Readings from Last 3 Encounters:  06/26/20 126 lb (57.2 kg)  05/24/20 127 lb 6.4 oz (57.8 kg)  03/26/20 127 lb (57.6 kg)   BMI Readings from Last 3 Encounters:  06/26/20 24.61 kg/m  05/24/20 24.88 kg/m  03/26/20 24.80 kg/m      Advanced Care Planning:  A voluntary discussion about advance care planning including the explanation and discussion of advance directives.   Discussed health care proxy and Living will, and the patient was able to identify a health care proxy and husband .   Patient does not have a living will at present time.   Social History      She        Social History   Socioeconomic History  . Marital status: Married  Spouse name: Not on file  . Number of children: 7  . Years of education: Not on file  . Highest education level: Some college, no degree  Occupational History  . Occupation: member Administrator, arts   Tobacco Use  . Smoking status: Current Every Day Smoker    Packs/day: 0.25    Years: 38.00    Pack years: 9.50    Types: Cigarettes  . Smokeless tobacco: Never Used  . Tobacco comment: smoing cessation class info provided  Vaping Use  . Vaping Use: Former  Substance and Sexual Activity  . Alcohol use: No    Alcohol/week: 0.0 standard drinks  . Drug use: No  . Sexual activity: Not Currently  Other Topics Concern  . Not on  file  Social History Narrative   Married , has 7 grown children, working part time   Investment banker, operational of Radio broadcast assistant Strain: Low Risk   . Difficulty of Paying Living Expenses: Not hard at all  Food Insecurity: No Food Insecurity  . Worried About Charity fundraiser in the Last Year: Never true  . Ran Out of Food in the Last Year: Never true  Transportation Needs: No Transportation Needs  . Lack of Transportation (Medical): No  . Lack of Transportation (Non-Medical): No  Physical Activity: Inactive  . Days of Exercise per Week: 0 days  . Minutes of Exercise per Session: 0 min  Stress: Stress Concern Present  . Feeling of Stress : To some extent  Social Connections: Socially Integrated  . Frequency of Communication with Friends and Family: More than three times a week  . Frequency of Social Gatherings with Friends and Family: Once a week  . Attends Religious Services: More than 4 times per year  . Active Member of Clubs or Organizations: Yes  . Attends Archivist Meetings: More than 4 times per year  . Marital Status: Married    Family History        Family History  Problem Relation Age of Onset  . Breast cancer Mother 68  . Alzheimer's disease Mother   . Kidney disease Mother   . Breast cancer Maternal Aunt 60  . Breast cancer Paternal Grandmother   . Polycythemia Maternal Grandmother     Patient Active Problem List   Diagnosis Date Noted  . Leg pain 04/08/2020  . Dyslipidemia 06/29/2019  . History of gestational diabetes 02/13/2017  . Avitaminosis D 02/15/2016  . Obstructive apnea 02/15/2016  . Blood glucose elevated 02/15/2016  . Abnormal LFTs 02/15/2016  . Dermatitis, eczematoid 02/15/2016  . Generalized anxiety disorder 10/11/2015  . Simple chronic bronchitis ([place]) 10/11/2015  . Chronic back pain 10/11/2015  . Chronic constipation 10/11/2015  . Reflux esophagitis 05/01/2007    Past Surgical History:  Procedure Laterality Date    . BREAST EXCISIONAL BIOPSY Left 1996   neg     Current Outpatient Medications:  .  albuterol (PROAIR HFA) 108 (90 Base) MCG/ACT inhaler, Inhale 2 puffs into the lungs every 6 (six) hours as needed for wheezing or shortness of breath., Disp: 18 g, Rfl: 0 .  busPIRone (BUSPAR) 5 MG tablet, Take 1 tablet (5 mg total) by mouth 3 (three) times daily. Most of 3 times daily, Disp: 90 tablet, Rfl: 5 .  Cholecalciferol (VITAMIN D) 50 MCG (2000 UT) CAPS, Take 1 capsule (2,000 Units total) by mouth daily., Disp: 30 capsule, Rfl: 0 .  fluticasone (FLONASE) 50 MCG/ACT nasal spray, Use 2 spray(s) in each nostril  once daily, Disp: 16 g, Rfl: 0 .  losartan (COZAAR) 50 MG tablet, Take 1 tablet (50 mg total) by mouth daily., Disp: 90 tablet, Rfl: 1 .  meclizine (ANTIVERT) 12.5 MG tablet, Take 1 tablet (12.5 mg total) by mouth 3 (three) times daily as needed for dizziness., Disp: 30 tablet, Rfl: 0 .  rosuvastatin (CRESTOR) 10 MG tablet, Take 1 tablet (10 mg total) by mouth daily., Disp: 90 tablet, Rfl: 1 .  cyclobenzaprine (FLEXERIL) 5 MG tablet, Take 1 tablet (5 mg total) by mouth 3 (three) times daily as needed for muscle spasms., Disp: 90 tablet, Rfl: 0  Allergies  Allergen Reactions  . Penicillins Swelling  . Simvastatin   . Zoloft [Sertraline Hcl] Other (See Comments)    Shop lift    Patient Care Team: Steele Sizer, MD as PCP - General (Family Medicine)  Review of Systems   Constitutional: Negative for fever or weight change.  Respiratory: Negative for cough and shortness of breath.   Cardiovascular: Negative for chest pain or palpitations.  Gastrointestinal: Negative for abdominal pain, no bowel changes.  Musculoskeletal: Negative for gait problem or joint swelling.  Skin: Negative for rash.  Neurological: Negative for dizziness or headache.  No other specific complaints in a complete review of systems (except as listed in HPI above).       Objective:   Vitals:  Vitals:   06/26/20  1029  BP: 116/80  Pulse: (!) 104  Resp: 12  Temp: 98.1 F (36.7 C)  TempSrc: Oral  SpO2: 99%  Weight: 126 lb (57.2 kg)  Height: 5' (1.524 m)    Body mass index is 24.61 kg/m.  Physical Exam  Constitutional: Patient appears well-developed and well-nourished. No distress.  HENT: Head: Normocephalic and atraumatic. Ears: B TMs ok, no erythema or effusion; Nose: Not done Mouth/Throat: not done  Eyes: Conjunctivae and EOM are normal. Pupils are equal, round, and reactive to light. No scleral icterus.  Neck: Normal range of motion. Neck supple. No JVD present. No thyromegaly present.  Cardiovascular: Normal rate, regular rhythm and normal heart sounds.  No murmur heard. No BLE edema. Pulmonary/Chest: Effort normal and breath sounds normal. No respiratory distress. Abdominal: Soft. Bowel sounds are normal, no distension. There is no tenderness. no masses Breast: no lumps or masses, no nipple discharge or rashes FEMALE GENITALIA:  Not done  RECTAL: not done  Musculoskeletal: Normal range of motion, no joint effusions. No gross deformities. Pain during palpation of paraspinal muscle of left upper back  Neurological: he is alert and oriented to person, place, and time. No cranial nerve deficit. Coordination, balance, strength, speech and gait are normal.  Skin: Skin is warm and dry. No rash noted. No erythema.  Psychiatric: Patient has a normal mood and affect. behavior is normal. Judgment and thought content normal.  Fall Risk: Fall Risk  06/26/2020 05/24/2020 12/02/2019 07/26/2019 04/22/2019  Falls in the past year? 0 0 0 0 0  Number falls in past yr: 0 0 0 0 0  Injury with Fall? 0 0 0 0 0  Risk for fall due to : - No Fall Risks - - -  Follow up - Falls prevention discussed - - -    Functional Status Survey: Is the patient deaf or have difficulty hearing?: No Does the patient have difficulty seeing, even when wearing glasses/contacts?: No Does the patient have difficulty  concentrating, remembering, or making decisions?: No Does the patient have difficulty walking or climbing stairs?: No Does the patient  have difficulty dressing or bathing?: No Does the patient have difficulty doing errands alone such as visiting a doctor's office or shopping?: No   Assessment & Plan:    1. Encounter for annual physical exam   2. Dyslipidemia  - rosuvastatin (CRESTOR) 10 MG tablet; Take 1 tablet (10 mg total) by mouth daily.  Dispense: 90 tablet; Refill: 1  3. Hypertension, benign  - losartan (COZAAR) 50 MG tablet; Take 1 tablet (50 mg total) by mouth daily.  Dispense: 90 tablet; Refill: 1  4. Intermittent vertigo   5. Nasal congestion   6. Avitaminosis D  - Cholecalciferol (VITAMIN D) 50 MCG (2000 UT) CAPS; Take 1 capsule (2,000 Units total) by mouth daily.  Dispense: 30 capsule; Refill: 0  7. GAD (generalized anxiety disorder)  - busPIRone (BUSPAR) 5 MG tablet; Take 1 tablet (5 mg total) by mouth 3 (three) times daily. Most of 3 times daily  Dispense: 90 tablet; Refill: 5  8. Simple chronic bronchitis ([place])   9. Need for immunization against influenza  - Flu Vaccine QUAD High Dose(Fluad)  10. Hematuria, unspecified type  - Urinalysis, microscopic only  11. Muscle spasm of back  - cyclobenzaprine (FLEXERIL) 5 MG tablet; Take 1 tablet (5 mg total) by mouth 3 (three) times daily as needed for muscle spasms.  Dispense: 90 tablet; Refill: 0  CPE completed today  . USPSTF grade A and B recommendations reviewed with patient; age-appropriate recommendations, preventive care, screening tests, etc discussed and encouraged; healthy living encouraged; see AVS for patient education given to patient  . Discussed importance of 150 minutes of physical activity weekly, AHA exercise recommendations given to pt in AVS/handout  . Discussed importance of healthy diet:  eating lean meats and proteins, avoiding trans fats and saturated fats, avoid simple sugars and  excessive carbs in diet, eat 6 servings of fruit/vegetables daily and drink plenty of water and avoid sweet beverages.    . Recommended pt to do annual eye exam and routine dental exams/cleanings  . Depression, alcohol, fall screening completed as documented above and per flowsheets  . Reviewed Health Maintenance: Health Maintenance  Topic Date Due  . TETANUS/TDAP  07/25/2020 (Originally 02/05/1973)  . COLONOSCOPY  02/28/2021 (Originally 02/06/2004)  . MAMMOGRAM  09/19/2021  . INFLUENZA VACCINE  Completed  . DEXA SCAN  Completed  . COVID-19 Vaccine  Completed  . Hepatitis C Screening  Completed  . PNA vac Low Risk Adult  Completed    . Immunizations: Immunization History  Administered Date(s) Administered  . Fluad Quad(high Dose 65+) 07/26/2019, 06/26/2020  . Moderna SARS-COVID-2 Vaccination 11/17/2019, 12/15/2019  . Pneumococcal Conjugate-13 04/26/2019  . Pneumococcal Polysaccharide-23 05/24/2020      Orders Placed This Encounter  Procedures  . Flu Vaccine QUAD High Dose(Fluad)  . Urinalysis, microscopic only        Loistine Chance, MD 06/26/20 10:59 AM  [facility]

## 2020-06-26 ENCOUNTER — Encounter: Payer: Self-pay | Admitting: Family Medicine

## 2020-06-26 ENCOUNTER — Other Ambulatory Visit: Payer: Self-pay

## 2020-06-26 ENCOUNTER — Ambulatory Visit (INDEPENDENT_AMBULATORY_CARE_PROVIDER_SITE_OTHER): Payer: Medicare PPO | Admitting: Family Medicine

## 2020-06-26 VITALS — BP 116/80 | HR 104 | Temp 98.1°F | Resp 12 | Ht 60.0 in | Wt 126.0 lb

## 2020-06-26 DIAGNOSIS — Z0000 Encounter for general adult medical examination without abnormal findings: Secondary | ICD-10-CM | POA: Diagnosis not present

## 2020-06-26 DIAGNOSIS — R319 Hematuria, unspecified: Secondary | ICD-10-CM

## 2020-06-26 DIAGNOSIS — R0981 Nasal congestion: Secondary | ICD-10-CM | POA: Diagnosis not present

## 2020-06-26 DIAGNOSIS — J410 Simple chronic bronchitis: Secondary | ICD-10-CM | POA: Diagnosis not present

## 2020-06-26 DIAGNOSIS — I10 Essential (primary) hypertension: Secondary | ICD-10-CM

## 2020-06-26 DIAGNOSIS — Z23 Encounter for immunization: Secondary | ICD-10-CM | POA: Diagnosis not present

## 2020-06-26 DIAGNOSIS — F411 Generalized anxiety disorder: Secondary | ICD-10-CM

## 2020-06-26 DIAGNOSIS — R42 Dizziness and giddiness: Secondary | ICD-10-CM | POA: Diagnosis not present

## 2020-06-26 DIAGNOSIS — E559 Vitamin D deficiency, unspecified: Secondary | ICD-10-CM

## 2020-06-26 DIAGNOSIS — E785 Hyperlipidemia, unspecified: Secondary | ICD-10-CM

## 2020-06-26 DIAGNOSIS — M62830 Muscle spasm of back: Secondary | ICD-10-CM

## 2020-06-26 MED ORDER — LOSARTAN POTASSIUM 50 MG PO TABS: 50.0000 mg | ORAL_TABLET | Freq: Every day | ORAL | 1 refills | Status: DC

## 2020-06-26 MED ORDER — CYCLOBENZAPRINE HCL 5 MG PO TABS: 5.0000 mg | ORAL_TABLET | Freq: Three times a day (TID) | ORAL | 0 refills | Status: DC | PRN

## 2020-06-26 MED ORDER — ROSUVASTATIN CALCIUM 10 MG PO TABS: 10.0000 mg | ORAL_TABLET | Freq: Every day | ORAL | 1 refills | Status: DC

## 2020-06-26 MED ORDER — BUSPIRONE HCL 5 MG PO TABS: 5.0000 mg | ORAL_TABLET | Freq: Three times a day (TID) | ORAL | 5 refills | Status: DC

## 2020-06-26 MED ORDER — VITAMIN D 50 MCG (2000 UT) PO CAPS
1.0000 | ORAL_CAPSULE | Freq: Every day | ORAL | 0 refills | Status: AC
Start: 2020-06-26 — End: ?

## 2020-06-26 NOTE — Patient Instructions (Signed)
Preventive Care 66 Years and Older, Female Preventive care refers to lifestyle choices and visits with your health care provider that can promote health and wellness. This includes:  A yearly physical exam. This is also called an annual well check.  Regular dental and eye exams.  Immunizations.  Screening for certain conditions.  Healthy lifestyle choices, such as diet and exercise. What can I expect for my preventive care visit? Physical exam Your health care provider will check:  Height and weight. These may be used to calculate body mass index (BMI), which is a measurement that tells if you are at a healthy weight.  Heart rate and blood pressure.  Your skin for abnormal spots. Counseling Your health care provider may ask you questions about:  Alcohol, tobacco, and drug use.  Emotional well-being.  Home and relationship well-being.  Sexual activity.  Eating habits.  History of falls.  Memory and ability to understand (cognition).  Work and work Statistician.  Pregnancy and menstrual history. What immunizations do I need?  Influenza (flu) vaccine  This is recommended every year. Tetanus, diphtheria, and pertussis (Tdap) vaccine  You may need a Td booster every 10 years. Varicella (chickenpox) vaccine  You may need this vaccine if you have not already been vaccinated. Zoster (shingles) vaccine  You may need this after age 33. Pneumococcal conjugate (PCV13) vaccine  One dose is recommended after age 33. Pneumococcal polysaccharide (PPSV23) vaccine  One dose is recommended after age 72. Measles, mumps, and rubella (MMR) vaccine  You may need at least one dose of MMR if you were born in 1957 or later. You may also need a second dose. Meningococcal conjugate (MenACWY) vaccine  You may need this if you have certain conditions. Hepatitis A vaccine  You may need this if you have certain conditions or if you travel or work in places where you may be exposed  to hepatitis A. Hepatitis B vaccine  You may need this if you have certain conditions or if you travel or work in places where you may be exposed to hepatitis B. Haemophilus influenzae type b (Hib) vaccine  You may need this if you have certain conditions. You may receive vaccines as individual doses or as more than one vaccine together in one shot (combination vaccines). Talk with your health care provider about the risks and benefits of combination vaccines. What tests do I need? Blood tests  Lipid and cholesterol levels. These may be checked every 5 years, or more frequently depending on your overall health.  Hepatitis C test.  Hepatitis B test. Screening  Lung cancer screening. You may have this screening every year starting at age 39 if you have a 30-pack-year history of smoking and currently smoke or have quit within the past 15 years.  Colorectal cancer screening. All adults should have this screening starting at age 36 and continuing until age 15. Your health care provider may recommend screening at age 23 if you are at increased risk. You will have tests every 1-10 years, depending on your results and the type of screening test.  Diabetes screening. This is done by checking your blood sugar (glucose) after you have not eaten for a while (fasting). You may have this done every 1-3 years.  Mammogram. This may be done every 1-2 years. Talk with your health care provider about how often you should have regular mammograms.  BRCA-related cancer screening. This may be done if you have a family history of breast, ovarian, tubal, or peritoneal cancers.  Other tests  Sexually transmitted disease (STD) testing.  Bone density scan. This is done to screen for osteoporosis. You may have this done starting at age 44. Follow these instructions at home: Eating and drinking  Eat a diet that includes fresh fruits and vegetables, whole grains, lean protein, and low-fat dairy products. Limit  your intake of foods with high amounts of sugar, saturated fats, and salt.  Take vitamin and mineral supplements as recommended by your health care provider.  Do not drink alcohol if your health care provider tells you not to drink.  If you drink alcohol: ? Limit how much you have to 0-1 drink a day. ? Be aware of how much alcohol is in your drink. In the [place] one drink equals one 12 oz bottle of beer (355 mL), one 5 oz glass of wine (148 mL), or one 1 oz glass of hard liquor (44 mL). Lifestyle  Take daily care of your teeth and gums.  Stay active. Exercise for at least 30 minutes on 5 or more days each week.  Do not use any products that contain nicotine or tobacco, such as cigarettes, e-cigarettes, and chewing tobacco. If you need help quitting, ask your health care provider.  If you are sexually active, practice safe sex. Use a condom or other form of protection in order to prevent STIs (sexually transmitted infections).  Talk with your health care provider about taking a low-dose aspirin or statin. What's next?  Go to your health care provider once a year for a well check visit.  Ask your health care provider how often you should have your eyes and teeth checked.  Stay up to date on all vaccines. This information is not intended to replace advice given to you by your health care provider. Make sure you discuss any questions you have with your health care provider. Document Revised: 09/16/2018 Document Reviewed: 09/16/2018 Elsevier Patient Education  2020 [employer].

## 2020-06-27 LAB — URINALYSIS, MICROSCOPIC ONLY
Bacteria, UA: NONE SEEN /HPF
Hyaline Cast: NONE SEEN /LPF
Squamous Epithelial / HPF: NONE SEEN /HPF (ref <=5)
WBC, UA: NONE SEEN /HPF (ref 0–5)

## 2020-07-16 ENCOUNTER — Other Ambulatory Visit: Payer: Self-pay | Admitting: Family Medicine

## 2020-07-16 DIAGNOSIS — Z1231 Encounter for screening mammogram for malignant neoplasm of breast: Secondary | ICD-10-CM

## 2020-09-03 NOTE — Progress Notes (Signed)
Name: Kristine Lowe   MRN: [id number]    DOB: [birth year]   Date:09/04/2020       Progress Note  Subjective  Chief Complaint  Follow up   HPI   Hematuria: found on urine dip, negative culture , last urine microscopy also positive , previous history of kidney stones found on CT done in 2006, she is willing to have it repeat imaging now and if still present we will monitor, otherwise refer to Urologist   HTN: bp is a little high, but she was upset when she came in because she had a co-pay during her last CPE and follow up, explained billing, continue medication. We will recheck leve  Found on Apple watch, at night heart rate down to mid 40's and during the day up to low 100's. No chest pain or palpitation, we will monitor for now. She has a history of sleep apnea, but does not want to get tested again, discussed heart rate variability may be due to sleep apnea.   Headaches: she states a long time ago she had similar headaches that she thought it was cluster type, it was many years ago and resolved by itself. Over the past weeks she has notice left ear fullness and pain that is intermittent and radiates to left frontal area of left side of nose, described as intense but dull that can last minutes - usually less than 5 minutes. No neuro deficit. She states she googled online. She also states she has noticed some left watery eyes but not associated with headache episode. Explained her symptoms could be cluster, but it may be trigeminal neuralgia. We will try treating for trigeminal neuralgia but if no improvement I will refer her to neurologist for further evaluation   Bradycardia: she has a new apple watch and at night she has some heart rate variability and bradycardia - HR 40-50's. She has a history of sleep apnea but never treated. She denies palpitation, chest pain, dizziness or decrease in exercise tolerance. Explained the heart rate variability may be from OSA, but less than 12 ms and we  can monitor or send her to cardiologist. Since bradycardia only at night and asymptomatic we can monitor for now  Patient Active Problem List   Diagnosis Date Noted  . Leg pain 04/08/2020  . Dyslipidemia 06/29/2019  . History of gestational diabetes 02/13/2017  . Avitaminosis D 02/15/2016  . Obstructive apnea 02/15/2016  . Blood glucose elevated 02/15/2016  . Abnormal LFTs 02/15/2016  . Dermatitis, eczematoid 02/15/2016  . Generalized anxiety disorder 10/11/2015  . Simple chronic bronchitis (HCC) 10/11/2015  . Chronic back pain 10/11/2015  . Chronic constipation 10/11/2015  . Reflux esophagitis 05/01/2007    Past Surgical History:  Procedure Laterality Date  . BREAST EXCISIONAL BIOPSY Left 1996   neg    Family History  Problem Relation Age of Onset  . Breast cancer Mother 70  . Alzheimer's disease Mother   . Kidney disease Mother   . Breast cancer Maternal Aunt 60  . Breast cancer Paternal Grandmother   . Polycythemia Maternal Grandmother     Social History   Tobacco Use  . Smoking status: Former Smoker    Packs/day: 0.25    Years: 38.00    Pack years: 9.50    Types: E-cigarettes    Quit date: 07/05/2020    Years since quitting: 0.1  . Smokeless tobacco: Never Used  . Tobacco comment: quit tobacco smoking 2 months ago switched to e-cigarettes  Substance  Use Topics  . Alcohol use: No    Alcohol/week: 0.0 standard drinks     Current Outpatient Medications:  .  albuterol (PROAIR HFA) 108 (90 Base) MCG/ACT inhaler, Inhale 2 puffs into the lungs every 6 (six) hours as needed for wheezing or shortness of breath., Disp: 18 g, Rfl: 0 .  busPIRone (BUSPAR) 5 MG tablet, Take 1 tablet (5 mg total) by mouth 3 (three) times daily. Most of 3 times daily, Disp: 90 tablet, Rfl: 5 .  Cholecalciferol (VITAMIN D) 50 MCG (2000 UT) CAPS, Take 1 capsule (2,000 Units total) by mouth daily., Disp: 30 capsule, Rfl: 0 .  cyclobenzaprine (FLEXERIL) 5 MG tablet, Take 1 tablet (5 mg total)  by mouth 3 (three) times daily as needed for muscle spasms., Disp: 90 tablet, Rfl: 0 .  fluticasone (FLONASE) 50 MCG/ACT nasal spray, Use 2 spray(s) in each nostril once daily, Disp: 16 g, Rfl: 0 .  losartan (COZAAR) 50 MG tablet, Take 1 tablet (50 mg total) by mouth daily., Disp: 90 tablet, Rfl: 1 .  meclizine (ANTIVERT) 12.5 MG tablet, Take 1 tablet (12.5 mg total) by mouth 3 (three) times daily as needed for dizziness., Disp: 30 tablet, Rfl: 0 .  rosuvastatin (CRESTOR) 10 MG tablet, Take 1 tablet (10 mg total) by mouth daily., Disp: 90 tablet, Rfl: 1 .  carbamazepine (TEGRETOL) 200 MG tablet, Take 1 tablet (200 mg total) by mouth 3 (three) times daily. Start BID and increase to TID as tolerated, Disp: 90 tablet, Rfl: 0  Allergies  Allergen Reactions  . Penicillins Swelling  . Simvastatin   . Zoloft [Sertraline Hcl] Other (See Comments)    Shop lift    I personally reviewed active problem list, medication list, allergies, family history, social history, health maintenance with the patient/caregiver today.   ROS  Constitutional: Negative for fever or weight change.  Respiratory: Negative for cough and shortness of breath.   Cardiovascular: Negative for chest pain or palpitations.  Gastrointestinal: Negative for abdominal pain, no bowel changes.  Musculoskeletal: Negative for gait problem or joint swelling.  Skin: Negative for rash.  Neurological: Negative for dizziness , positive for  headache.  No other specific complaints in a complete review of systems (except as listed in HPI above).  Objective  Vitals:   09/04/20 1105  BP: (!) 148/82  Pulse: 88  Resp: 12  Temp: 98 F (36.7 C)  SpO2: 99%  Weight: 127 lb 14.4 oz (58 kg)  Height: 5' (1.524 m)    Body mass index is 24.98 kg/m.  Physical Exam  Constitutional: Patient appears well-developed and well-nourished.  No distress.  HEENT: head atraumatic, normocephalic, pupils equal and reactive to light, neck  supple Cardiovascular: Normal rate, regular rhythm and normal heart sounds.  No murmur heard. No BLE edema. Pulmonary/Chest: Effort normal and breath sounds normal. No respiratory distress. Abdominal: Soft.  There is no tenderness. Negative CVA tenderness Neurological: normal exam, cranial nerves intact Psychiatric: Patient has a normal mood and affect. behavior is normal. Judgment and thought content normal.  Recent Results (from the past 2160 hour(s))  Urinalysis, microscopic only     Status: Abnormal   Collection Time: 06/26/20 12:18 PM  Result Value Ref Range   WBC, UA NONE SEEN 0 - 5 /HPF   RBC / HPF 3-10 (A) 0 - 2 /HPF   Squamous Epithelial / LPF NONE SEEN < OR = 5 /HPF   Bacteria, UA NONE SEEN NONE SEEN /HPF   Hyaline Cast NONE  SEEN NONE SEEN /LPF     PHQ2/9: Depression screen [facility] 2/9 09/04/2020 06/26/2020 05/24/2020 02/29/2020 12/02/2019  Decreased Interest 0 0 0 0 0  Down, Depressed, Hopeless 0 0 0 0 0  PHQ - 2 Score 0 0 0 0 0  Altered sleeping 0 0 - 0 0  Tired, decreased energy 0 0 - 0 0  Change in appetite 0 0 - 0 0  Feeling bad or failure about yourself  0 0 - 0 0  Trouble concentrating (No Data) 0 - 0 0  Moving slowly or fidgety/restless 0 0 - 0 0  Suicidal thoughts 0 0 - 0 0  PHQ-9 Score 0 0 - 0 0  Difficult doing work/chores - - - - -    phq 9 is negative   Fall Risk: Fall Risk  09/04/2020 06/26/2020 05/24/2020 12/02/2019 07/26/2019  Falls in the past year? 0 0 0 0 0  Number falls in past yr: 0 0 0 0 0  Injury with Fall? 0 0 0 0 0  Risk for fall due to : - - No Fall Risks - -  Follow up - - Falls prevention discussed - -     Functional Status Survey: Is the patient deaf or have difficulty hearing?: No Does the patient have difficulty seeing, even when wearing glasses/contacts?: No Does the patient have difficulty concentrating, remembering, or making decisions?: No Does the patient have difficulty walking or climbing stairs?: No Does the patient have  difficulty dressing or bathing?: No Does the patient have difficulty doing errands alone such as visiting a doctor's office or shopping?: No    Assessment & Plan  1. Asymptomatic microscopic hematuria  - US RENAL; Future  2. Trigeminal neuralgia of left side of face  - carbamazepine (TEGRETOL) 200 MG tablet; Take 1 tablet (200 mg total) by mouth 3 (three) times daily. Start BID and increase to TID as tolerated  Dispense: 90 tablet; Refill: 0  Possible cluster headache, call back if no improvement  3. Ear fullness, left   4. Watery eyes  Advised evaluation by eye doctor if no improvement , it is not at the same time as headache episodes   5. Bradycardia, sinus

## 2020-09-04 ENCOUNTER — Other Ambulatory Visit: Payer: Self-pay

## 2020-09-04 ENCOUNTER — Ambulatory Visit: Payer: Medicare PPO | Admitting: Family Medicine

## 2020-09-04 ENCOUNTER — Encounter: Payer: Self-pay | Admitting: Family Medicine

## 2020-09-04 VITALS — BP 148/82 | HR 88 | Temp 98.0°F | Resp 12 | Ht 60.0 in | Wt 127.9 lb

## 2020-09-04 DIAGNOSIS — H04203 Unspecified epiphora, bilateral lacrimal glands: Secondary | ICD-10-CM | POA: Diagnosis not present

## 2020-09-04 DIAGNOSIS — R001 Bradycardia, unspecified: Secondary | ICD-10-CM

## 2020-09-04 DIAGNOSIS — R3121 Asymptomatic microscopic hematuria: Secondary | ICD-10-CM

## 2020-09-04 DIAGNOSIS — G500 Trigeminal neuralgia: Secondary | ICD-10-CM | POA: Diagnosis not present

## 2020-09-04 DIAGNOSIS — H938X2 Other specified disorders of left ear: Secondary | ICD-10-CM | POA: Diagnosis not present

## 2020-09-04 MED ORDER — CARBAMAZEPINE 200 MG PO TABS: 200.0000 mg | ORAL_TABLET | Freq: Three times a day (TID) | ORAL | 0 refills | Status: DC

## 2020-09-05 ENCOUNTER — Encounter: Payer: Self-pay | Admitting: Family Medicine

## 2020-09-07 ENCOUNTER — Ambulatory Visit
Admission: RE | Admit: 2020-09-07 | Discharge: 2020-09-07 | Disposition: A | Discharge status: Home / Self Care | Payer: Medicare PPO | Source: Ambulatory Visit | Attending: Family Medicine | Admitting: Family Medicine

## 2020-09-07 ENCOUNTER — Other Ambulatory Visit: Payer: Self-pay

## 2020-09-07 DIAGNOSIS — N281 Cyst of kidney, acquired: Secondary | ICD-10-CM | POA: Diagnosis not present

## 2020-09-07 DIAGNOSIS — R3121 Asymptomatic microscopic hematuria: Secondary | ICD-10-CM | POA: Diagnosis not present

## 2020-09-20 ENCOUNTER — Other Ambulatory Visit: Payer: Self-pay

## 2020-09-20 ENCOUNTER — Ambulatory Visit
Admission: RE | Admit: 2020-09-20 | Discharge: 2020-09-20 | Disposition: A | Discharge status: Home / Self Care | Payer: Medicare PPO | Source: Ambulatory Visit | Attending: Family Medicine | Admitting: Family Medicine

## 2020-09-20 DIAGNOSIS — Z1231 Encounter for screening mammogram for malignant neoplasm of breast: Secondary | ICD-10-CM

## 2020-10-12 ENCOUNTER — Encounter: Payer: Self-pay | Admitting: Family Medicine

## 2020-10-12 ENCOUNTER — Other Ambulatory Visit: Payer: Self-pay | Admitting: Family Medicine

## 2020-10-12 DIAGNOSIS — G500 Trigeminal neuralgia: Secondary | ICD-10-CM

## 2020-10-12 DIAGNOSIS — H938X2 Other specified disorders of left ear: Secondary | ICD-10-CM

## 2020-10-12 DIAGNOSIS — R519 Headache, unspecified: Secondary | ICD-10-CM

## 2020-11-01 ENCOUNTER — Ambulatory Visit
Admission: RE | Admit: 2020-11-01 | Discharge: 2020-11-01 | Disposition: A | Discharge status: Home / Self Care | Payer: Medicare HMO | Source: Ambulatory Visit | Attending: Family Medicine | Admitting: Family Medicine

## 2020-11-01 ENCOUNTER — Other Ambulatory Visit: Payer: Self-pay

## 2020-11-01 DIAGNOSIS — G500 Trigeminal neuralgia: Secondary | ICD-10-CM | POA: Diagnosis not present

## 2020-11-01 DIAGNOSIS — K111 Hypertrophy of salivary gland: Secondary | ICD-10-CM | POA: Diagnosis not present

## 2020-11-01 DIAGNOSIS — H938X2 Other specified disorders of left ear: Secondary | ICD-10-CM

## 2020-11-01 DIAGNOSIS — R519 Headache, unspecified: Secondary | ICD-10-CM | POA: Insufficient documentation

## 2020-11-01 MED ORDER — GADOBUTROL 1 MMOL/ML IV SOLN
5.0000 mL | Freq: Once | INTRAVENOUS | Status: AC | PRN
  Administered 2020-11-01: 5 mL via INTRAVENOUS

## 2020-11-10 ENCOUNTER — Other Ambulatory Visit: Payer: Self-pay | Admitting: Family Medicine

## 2020-11-10 DIAGNOSIS — R0981 Nasal congestion: Secondary | ICD-10-CM

## 2020-11-10 NOTE — Telephone Encounter (Signed)
Requested Prescriptions  Pending Prescriptions Disp Refills  . fluticasone (FLONASE) 50 MCG/ACT nasal spray [Pharmacy Med Name: Fluticasone Propionate 50 MCG/ACT Nasal Suspension] 16 g 0    Sig: Use 2 spray(s) in each nostril once daily     Ear, Nose, and Throat: Nasal Preparations - Corticosteroids Passed - 11/10/2020  9:07 AM      Passed - Valid encounter within last 12 months    Recent Outpatient Visits          2 months ago Asymptomatic microscopic hematuria   [facility] Alba Cory, MD   4 months ago Simple chronic bronchitis [facility])   [facility] Alba Cory, MD   8 months ago Simple chronic bronchitis [facility])   [facility] Alba Cory, MD   11 months ago Simple chronic bronchitis [facility])   [facility] Alba Cory, MD   1 year ago Simple chronic bronchitis [facility])   [facility] Alba Cory, MD      Future Appointments            In 1 month Carlynn Purl, Danna Hefty, MD [facility]   In 6 months  [facility]   In 7 months Alba Cory, MD [facility]

## 2020-11-24 ENCOUNTER — Other Ambulatory Visit: Payer: Self-pay | Admitting: Family Medicine

## 2020-11-24 DIAGNOSIS — J410 Simple chronic bronchitis: Secondary | ICD-10-CM

## 2020-11-28 DIAGNOSIS — M9901 Segmental and somatic dysfunction of cervical region: Secondary | ICD-10-CM | POA: Diagnosis not present

## 2020-11-28 DIAGNOSIS — M9902 Segmental and somatic dysfunction of thoracic region: Secondary | ICD-10-CM | POA: Diagnosis not present

## 2020-11-28 DIAGNOSIS — M5134 Other intervertebral disc degeneration, thoracic region: Secondary | ICD-10-CM | POA: Diagnosis not present

## 2020-11-28 DIAGNOSIS — R519 Headache, unspecified: Secondary | ICD-10-CM | POA: Diagnosis not present

## 2020-11-30 DIAGNOSIS — M9901 Segmental and somatic dysfunction of cervical region: Secondary | ICD-10-CM | POA: Diagnosis not present

## 2020-11-30 DIAGNOSIS — R519 Headache, unspecified: Secondary | ICD-10-CM | POA: Diagnosis not present

## 2020-11-30 DIAGNOSIS — M9902 Segmental and somatic dysfunction of thoracic region: Secondary | ICD-10-CM | POA: Diagnosis not present

## 2020-11-30 DIAGNOSIS — M5134 Other intervertebral disc degeneration, thoracic region: Secondary | ICD-10-CM | POA: Diagnosis not present

## 2020-12-03 DIAGNOSIS — M9901 Segmental and somatic dysfunction of cervical region: Secondary | ICD-10-CM | POA: Diagnosis not present

## 2020-12-03 DIAGNOSIS — M9902 Segmental and somatic dysfunction of thoracic region: Secondary | ICD-10-CM | POA: Diagnosis not present

## 2020-12-03 DIAGNOSIS — R519 Headache, unspecified: Secondary | ICD-10-CM | POA: Diagnosis not present

## 2020-12-03 DIAGNOSIS — M5134 Other intervertebral disc degeneration, thoracic region: Secondary | ICD-10-CM | POA: Diagnosis not present

## 2020-12-06 DIAGNOSIS — R519 Headache, unspecified: Secondary | ICD-10-CM | POA: Diagnosis not present

## 2020-12-06 DIAGNOSIS — M9901 Segmental and somatic dysfunction of cervical region: Secondary | ICD-10-CM | POA: Diagnosis not present

## 2020-12-06 DIAGNOSIS — M5134 Other intervertebral disc degeneration, thoracic region: Secondary | ICD-10-CM | POA: Diagnosis not present

## 2020-12-06 DIAGNOSIS — M9902 Segmental and somatic dysfunction of thoracic region: Secondary | ICD-10-CM | POA: Diagnosis not present

## 2020-12-10 DIAGNOSIS — M9902 Segmental and somatic dysfunction of thoracic region: Secondary | ICD-10-CM | POA: Diagnosis not present

## 2020-12-10 DIAGNOSIS — M9901 Segmental and somatic dysfunction of cervical region: Secondary | ICD-10-CM | POA: Diagnosis not present

## 2020-12-10 DIAGNOSIS — M5134 Other intervertebral disc degeneration, thoracic region: Secondary | ICD-10-CM | POA: Diagnosis not present

## 2020-12-10 DIAGNOSIS — R519 Headache, unspecified: Secondary | ICD-10-CM | POA: Diagnosis not present

## 2020-12-10 NOTE — Progress Notes (Signed)
Name: Kristine Lowe   MRN: [id number]    DOB: [birth year]   Date:12/11/2020       Progress Note  Subjective  Chief Complaint  Follow Up  HPI  Hematuria: found on urine dip, negative culture , last urine microscopy also positive , previous history of kidney stones found on CT done in 2006, she would like to go back to Urologist now   Anxiety: long history and has been doing well on Buspar, taking one to two daily, she tried SSRI but causes her side effects   HTN: bp is at goal, no chest pain or palpitation. Denies SOB with activity   Dyslipidemia, improvement of LDL on crestor, recheck labs today  Vitamin D deficiency: continue otc supplementation   Chronic bronchitis: she states gets worse this time of the year when pollen count is high. She is vaping now, she does not cough in am's. She states feels a little tight when outdoors. She denies rhinorrhea or itchy eyes, but has respiratory symptoms   Found on Apple watch, at night heart rate down to mid 40's and during the day up to low 100's. No chest pain or palpitation, we will monitor for now. She has a history of sleep apnea, but does not want to get tested again, discussed heart rate variability may be due to sleep apnea. Unchanged   Headaches: she states a long time ago she had similar headaches that she thought it was cluster type, it was many years ago and resolved by itself. Last Fall  she noticed  left ear fullness and pain that was  intermittent and radiates to left frontal area of left side of nose, described as intense but dull that can last minutes - usually lasts less than 5 minutes. No neuro deficit.  She also states she has noticed some left watery eyes but not associated with headache episode. Explained her symptoms could be cluster, but it may be trigeminal neuralgia. We gave her carbamazepine, but is now seeing chiropractor and headache has improved so she stopped medications, she states it caused her to feel down    Bradycardia: she has a new apple watch and at night she has some heart rate variability and bradycardia - HR 40-50's. She has a history of sleep apnea but never treated. She denies palpitation, chest pain, dizziness or decrease in exercise tolerance. Explained the heart rate variability may be from OSA, but less than 12 ms and we can monitor or send her to cardiologist. Since bradycardia only at night and asymptomatic we can monitor for now  Patient Active Problem List   Diagnosis Date Noted  . Leg pain 04/08/2020  . Dyslipidemia 06/29/2019  . History of gestational diabetes 02/13/2017  . Avitaminosis D 02/15/2016  . Obstructive apnea 02/15/2016  . Blood glucose elevated 02/15/2016  . Abnormal LFTs 02/15/2016  . Dermatitis, eczematoid 02/15/2016  . Generalized anxiety disorder 10/11/2015  . Simple chronic bronchitis (HCC) 10/11/2015  . Chronic back pain 10/11/2015  . Chronic constipation 10/11/2015  . Reflux esophagitis 05/01/2007    Past Surgical History:  Procedure Laterality Date  . BREAST EXCISIONAL BIOPSY Left 1996   neg    Family History  Problem Relation Age of Onset  . Breast cancer Mother 54  . Alzheimer's disease Mother   . Kidney disease Mother   . Breast cancer Maternal Aunt 60  . Breast cancer Paternal Grandmother   . Polycythemia Maternal Grandmother     Social History   Tobacco Use  .  Smoking status: Former Smoker    Packs/day: 0.25    Years: 38.00    Pack years: 9.50    Types: E-cigarettes    Quit date: 07/05/2020    Years since quitting: 0.4  . Smokeless tobacco: Never Used  . Tobacco comment: quit tobacco smoking 2 months ago switched to e-cigarettes  Substance Use Topics  . Alcohol use: No    Alcohol/week: 0.0 standard drinks     Current Outpatient Medications:  .  albuterol (VENTOLIN HFA) 108 (90 Base) MCG/ACT inhaler, INHALE 2 PUFFS BY MOUTH EVERY 6 HOURS AS NEEDED FOR WHEEZING OR SHORTNESS OF BREATH, Disp: 18 g, Rfl: 1 .  fluticasone  (FLONASE) 50 MCG/ACT nasal spray, Use 2 spray(s) in each nostril once daily, Disp: 16 g, Rfl: 0 .  loratadine (CLARITIN) 10 MG tablet, Take 1 tablet (10 mg total) by mouth daily., Disp: 90 tablet, Rfl: 1 .  meclizine (ANTIVERT) 12.5 MG tablet, Take 1 tablet (12.5 mg total) by mouth 3 (three) times daily as needed for dizziness., Disp: 30 tablet, Rfl: 0 .  montelukast (SINGULAIR) 10 MG tablet, Take 1 tablet (10 mg total) by mouth at bedtime., Disp: 90 tablet, Rfl: 1 .  busPIRone (BUSPAR) 5 MG tablet, Take 1 tablet (5 mg total) by mouth 3 (three) times daily. Most of 3 times daily, Disp: 90 tablet, Rfl: 5 .  Cholecalciferol (VITAMIN D) 50 MCG (2000 UT) CAPS, Take 1 capsule (2,000 Units total) by mouth daily. (Patient not taking: Reported on 12/11/2020), Disp: 30 capsule, Rfl: 0 .  cyclobenzaprine (FLEXERIL) 5 MG tablet, Take 1 tablet (5 mg total) by mouth 3 (three) times daily as needed for muscle spasms., Disp: 90 tablet, Rfl: 0 .  losartan (COZAAR) 50 MG tablet, Take 1 tablet (50 mg total) by mouth daily., Disp: 90 tablet, Rfl: 1 .  rosuvastatin (CRESTOR) 10 MG tablet, Take 1 tablet (10 mg total) by mouth daily., Disp: 90 tablet, Rfl: 1  Allergies  Allergen Reactions  . Penicillins Swelling  . Simvastatin   . Zoloft [Sertraline Hcl] Other (See Comments)    Shop lift    I personally reviewed active problem list, medication list, allergies, family history, social history, health maintenance with the patient/caregiver today.   ROS  Constitutional: Negative for fever or weight change.  Respiratory: Negative for cough and shortness of breath.   Cardiovascular: Negative for chest pain or palpitations.  Gastrointestinal: Negative for abdominal pain, no bowel changes.  Musculoskeletal: Negative for gait problem or joint swelling.  Skin: Negative for rash.  Neurological: Negative for dizziness or headache.  No other specific complaints in a complete review of systems (except as listed in HPI  above).  Objective  Vitals:   12/11/20 1052  BP: 122/78  Pulse: 93  Resp: 16  Temp: 98.2 F (36.8 C)  TempSrc: Oral  SpO2: 96%  Weight: 130 lb (59 kg)  Height: 5' (1.524 m)    Body mass index is 25.39 kg/m.  Physical Exam  Constitutional: Patient appears well-developed and well-nourished. No distress.  HEENT: head atraumatic, normocephalic, pupils equal and reactive to light, neck supple Cardiovascular: Normal rate, regular rhythm and normal heart sounds.  No murmur heard. No BLE edema. Pulmonary/Chest: Effort normal and breath sounds normal. No respiratory distress. Abdominal: Soft.  There is no tenderness. Psychiatric: Patient has a normal mood and affect. behavior is normal. Judgment and thought content normal.  PHQ2/9: Depression screen [facility] 2/9 12/11/2020 09/04/2020 06/26/2020 05/24/2020 02/29/2020  Decreased Interest 0 0 0  0 0  Down, Depressed, Hopeless 0 0 0 0 0  PHQ - 2 Score 0 0 0 0 0  Altered sleeping - 0 0 - 0  Tired, decreased energy - 0 0 - 0  Change in appetite - 0 0 - 0  Feeling bad or failure about yourself  - 0 0 - 0  Trouble concentrating - (No Data) 0 - 0  Moving slowly or fidgety/restless - 0 0 - 0  Suicidal thoughts - 0 0 - 0  PHQ-9 Score - 0 0 - 0  Difficult doing work/chores - - - - -    phq 9 is negative   Fall Risk: Fall Risk  12/11/2020 09/04/2020 06/26/2020 05/24/2020 12/02/2019  Falls in the past year? 0 0 0 0 0  Number falls in past yr: 0 0 0 0 0  Injury with Fall? 0 0 0 0 0  Risk for fall due to : - - - No Fall Risks -  Follow up - - - Falls prevention discussed -     Functional Status Survey: Is the patient deaf or have difficulty hearing?: No Does the patient have difficulty seeing, even when wearing glasses/contacts?: No Does the patient have difficulty concentrating, remembering, or making decisions?: No Does the patient have difficulty walking or climbing stairs?: No Does the patient have difficulty dressing or bathing?: No Does  the patient have difficulty doing errands alone such as visiting a doctor's office or shopping?: No    Assessment & Plan  1. Muscle spasm of back  - cyclobenzaprine (FLEXERIL) 5 MG tablet; Take 1 tablet (5 mg total) by mouth 3 (three) times daily as needed for muscle spasms.  Dispense: 90 tablet; Refill: 0  2. Hypertension, benign  - losartan (COZAAR) 50 MG tablet; Take 1 tablet (50 mg total) by mouth daily.  Dispense: 90 tablet; Refill: 1 - COMPLETE METABOLIC PANEL WITH GFR - CBC with Differential/Platelet  3. Dyslipidemia  - rosuvastatin (CRESTOR) 10 MG tablet; Take 1 tablet (10 mg total) by mouth daily.  Dispense: 90 tablet; Refill: 1 - Lipid panel  4. GAD (generalized anxiety disorder)  - busPIRone (BUSPAR) 5 MG tablet; Take 1 tablet (5 mg total) by mouth 3 (three) times daily. Most of 3 times daily  Dispense: 90 tablet; Refill: 5  5. Asymptomatic microscopic hematuria   6. Simple chronic bronchitis (HCC)   7. Long-term use of high-risk medication   8. Bradycardia, sinus   9. Vitamin D deficiency   10. Hyperglycemia   - Hemoglobin A1c  11. Seasonal allergic rhinitis due to pollen  - loratadine (CLARITIN) 10 MG tablet; Take 1 tablet (10 mg total) by mouth daily.  Dispense: 90 tablet; Refill: 1 - montelukast (SINGULAIR) 10 MG tablet; Take 1 tablet (10 mg total) by mouth at bedtime.  Dispense: 90 tablet; Refill: 1

## 2020-12-11 ENCOUNTER — Ambulatory Visit (INDEPENDENT_AMBULATORY_CARE_PROVIDER_SITE_OTHER): Payer: Medicare HMO | Admitting: Family Medicine

## 2020-12-11 ENCOUNTER — Other Ambulatory Visit: Payer: Self-pay

## 2020-12-11 ENCOUNTER — Encounter: Payer: Self-pay | Admitting: Family Medicine

## 2020-12-11 VITALS — BP 122/78 | HR 93 | Temp 98.2°F | Resp 16 | Ht 60.0 in | Wt 130.0 lb

## 2020-12-11 DIAGNOSIS — E785 Hyperlipidemia, unspecified: Secondary | ICD-10-CM | POA: Diagnosis not present

## 2020-12-11 DIAGNOSIS — Z79899 Other long term (current) drug therapy: Secondary | ICD-10-CM | POA: Diagnosis not present

## 2020-12-11 DIAGNOSIS — F411 Generalized anxiety disorder: Secondary | ICD-10-CM

## 2020-12-11 DIAGNOSIS — R739 Hyperglycemia, unspecified: Secondary | ICD-10-CM | POA: Diagnosis not present

## 2020-12-11 DIAGNOSIS — I10 Essential (primary) hypertension: Secondary | ICD-10-CM

## 2020-12-11 DIAGNOSIS — M62830 Muscle spasm of back: Secondary | ICD-10-CM | POA: Diagnosis not present

## 2020-12-11 DIAGNOSIS — J301 Allergic rhinitis due to pollen: Secondary | ICD-10-CM

## 2020-12-11 DIAGNOSIS — J410 Simple chronic bronchitis: Secondary | ICD-10-CM | POA: Diagnosis not present

## 2020-12-11 DIAGNOSIS — R001 Bradycardia, unspecified: Secondary | ICD-10-CM | POA: Diagnosis not present

## 2020-12-11 DIAGNOSIS — R3121 Asymptomatic microscopic hematuria: Secondary | ICD-10-CM

## 2020-12-11 DIAGNOSIS — E559 Vitamin D deficiency, unspecified: Secondary | ICD-10-CM

## 2020-12-11 DIAGNOSIS — R319 Hematuria, unspecified: Secondary | ICD-10-CM

## 2020-12-11 MED ORDER — ROSUVASTATIN CALCIUM 10 MG PO TABS: 10.0000 mg | ORAL_TABLET | Freq: Every day | ORAL | 1 refills | Status: DC

## 2020-12-11 MED ORDER — CYCLOBENZAPRINE HCL 5 MG PO TABS: 5.0000 mg | ORAL_TABLET | Freq: Three times a day (TID) | ORAL | 0 refills | Status: DC | PRN

## 2020-12-11 MED ORDER — LOSARTAN POTASSIUM 50 MG PO TABS: 50.0000 mg | ORAL_TABLET | Freq: Every day | ORAL | 1 refills | Status: DC

## 2020-12-11 MED ORDER — BUSPIRONE HCL 5 MG PO TABS: 5.0000 mg | ORAL_TABLET | Freq: Three times a day (TID) | ORAL | 5 refills | Status: DC

## 2020-12-11 MED ORDER — MONTELUKAST SODIUM 10 MG PO TABS: 10.0000 mg | ORAL_TABLET | Freq: Every day | ORAL | 1 refills | Status: DC

## 2020-12-11 MED ORDER — LORATADINE 10 MG PO TABS: 10.0000 mg | ORAL_TABLET | Freq: Every day | ORAL | 1 refills | Status: DC

## 2020-12-11 NOTE — Addendum Note (Signed)
Addended by: Benay Pike on: 12/11/2020 11:27 AM   Modules accepted: Orders

## 2020-12-12 LAB — COMPLETE METABOLIC PANEL WITH GFR
AG Ratio: 1.7 (calc) (ref 1.0–2.5)
ALT: 12 U/L (ref 6–29)
AST: 13 U/L (ref 10–35)
Albumin: 4.3 g/dL (ref 3.6–5.1)
Alkaline phosphatase (APISO): 93 U/L (ref 37–153)
BUN: 19 mg/dL (ref 7–25)
CO2: 27 mmol/L (ref 20–32)
Calcium: 10.4 mg/dL (ref 8.6–10.4)
Chloride: 104 mmol/L (ref 98–110)
Creat: 0.63 mg/dL (ref 0.50–0.99)
GFR, Est African American: 108 mL/min/{1.73_m2} (ref >=60)
GFR, Est Non African American: 93 mL/min/{1.73_m2} (ref >=60)
Globulin: 2.6 g/dL (calc) (ref 1.9–3.7)
Glucose, Bld: 96 mg/dL (ref 65–99)
Potassium: 4.8 mmol/L (ref 3.5–5.3)
Sodium: 141 mmol/L (ref 135–146)
Total Bilirubin: 0.3 mg/dL (ref 0.2–1.2)
Total Protein: 6.9 g/dL (ref 6.1–8.1)

## 2020-12-12 LAB — CBC WITH DIFFERENTIAL/PLATELET
Absolute Monocytes: 416 cells/uL (ref 200–950)
Basophils Absolute: 57 cells/uL (ref 0–200)
Basophils Relative: 1.1 %
Eosinophils Absolute: 99 cells/uL (ref 15–500)
Eosinophils Relative: 1.9 %
HCT: 40.6 % (ref 35.0–45.0)
Hemoglobin: 13.5 g/dL (ref 11.7–15.5)
Lymphs Abs: 1321 cells/uL (ref 850–3900)
MCH: 28.9 pg (ref 27.0–33.0)
MCHC: 33.3 g/dL (ref 32.0–36.0)
MCV: 86.9 fL (ref 80.0–100.0)
MPV: 10.8 fL (ref 7.5–12.5)
Monocytes Relative: 8 %
Neutro Abs: 3307 cells/uL (ref 1500–7800)
Neutrophils Relative %: 63.6 %
Platelets: 316 10*3/uL (ref 140–400)
RBC: 4.67 10*6/uL (ref 3.80–5.10)
RDW: 13.5 % (ref 11.0–15.0)
Total Lymphocyte: 25.4 %
WBC: 5.2 10*3/uL (ref 3.8–10.8)

## 2020-12-12 LAB — LIPID PANEL
Cholesterol: 290 mg/dL — ABNORMAL HIGH (ref <200)
HDL: 50 mg/dL (ref >=50)
LDL Cholesterol (Calc): 195 mg/dL (calc) — ABNORMAL HIGH
Non-HDL Cholesterol (Calc): 240 mg/dL (calc) — ABNORMAL HIGH (ref <130)
Total CHOL/HDL Ratio: 5.8 (calc) — ABNORMAL HIGH (ref <5.0)
Triglycerides: 250 mg/dL — ABNORMAL HIGH (ref <150)

## 2020-12-12 LAB — HEMOGLOBIN A1C
Hgb A1c MFr Bld: 5.7 % of total Hgb — ABNORMAL HIGH (ref <5.7)
Mean Plasma Glucose: 117 mg/dL
eAG (mmol/L): 6.5 mmol/L

## 2020-12-13 DIAGNOSIS — M5134 Other intervertebral disc degeneration, thoracic region: Secondary | ICD-10-CM | POA: Diagnosis not present

## 2020-12-13 DIAGNOSIS — M9901 Segmental and somatic dysfunction of cervical region: Secondary | ICD-10-CM | POA: Diagnosis not present

## 2020-12-13 DIAGNOSIS — R519 Headache, unspecified: Secondary | ICD-10-CM | POA: Diagnosis not present

## 2020-12-13 DIAGNOSIS — M9902 Segmental and somatic dysfunction of thoracic region: Secondary | ICD-10-CM | POA: Diagnosis not present

## 2020-12-17 DIAGNOSIS — M9902 Segmental and somatic dysfunction of thoracic region: Secondary | ICD-10-CM | POA: Diagnosis not present

## 2020-12-17 DIAGNOSIS — M5134 Other intervertebral disc degeneration, thoracic region: Secondary | ICD-10-CM | POA: Diagnosis not present

## 2020-12-17 DIAGNOSIS — M9901 Segmental and somatic dysfunction of cervical region: Secondary | ICD-10-CM | POA: Diagnosis not present

## 2020-12-17 DIAGNOSIS — R519 Headache, unspecified: Secondary | ICD-10-CM | POA: Diagnosis not present

## 2020-12-20 DIAGNOSIS — R519 Headache, unspecified: Secondary | ICD-10-CM | POA: Diagnosis not present

## 2020-12-20 DIAGNOSIS — M9902 Segmental and somatic dysfunction of thoracic region: Secondary | ICD-10-CM | POA: Diagnosis not present

## 2020-12-20 DIAGNOSIS — M5134 Other intervertebral disc degeneration, thoracic region: Secondary | ICD-10-CM | POA: Diagnosis not present

## 2020-12-20 DIAGNOSIS — M9901 Segmental and somatic dysfunction of cervical region: Secondary | ICD-10-CM | POA: Diagnosis not present

## 2020-12-24 DIAGNOSIS — M9901 Segmental and somatic dysfunction of cervical region: Secondary | ICD-10-CM | POA: Diagnosis not present

## 2020-12-24 DIAGNOSIS — R519 Headache, unspecified: Secondary | ICD-10-CM | POA: Diagnosis not present

## 2020-12-24 DIAGNOSIS — M5134 Other intervertebral disc degeneration, thoracic region: Secondary | ICD-10-CM | POA: Diagnosis not present

## 2020-12-24 DIAGNOSIS — M9902 Segmental and somatic dysfunction of thoracic region: Secondary | ICD-10-CM | POA: Diagnosis not present

## 2020-12-27 DIAGNOSIS — M5134 Other intervertebral disc degeneration, thoracic region: Secondary | ICD-10-CM | POA: Diagnosis not present

## 2020-12-27 DIAGNOSIS — R519 Headache, unspecified: Secondary | ICD-10-CM | POA: Diagnosis not present

## 2020-12-27 DIAGNOSIS — M9902 Segmental and somatic dysfunction of thoracic region: Secondary | ICD-10-CM | POA: Diagnosis not present

## 2020-12-27 DIAGNOSIS — M9901 Segmental and somatic dysfunction of cervical region: Secondary | ICD-10-CM | POA: Diagnosis not present

## 2020-12-28 DIAGNOSIS — F332 Major depressive disorder, recurrent severe without psychotic features: Secondary | ICD-10-CM | POA: Diagnosis not present

## 2020-12-28 DIAGNOSIS — R4182 Altered mental status, unspecified: Secondary | ICD-10-CM | POA: Diagnosis not present

## 2020-12-28 DIAGNOSIS — N390 Urinary tract infection, site not specified: Secondary | ICD-10-CM | POA: Diagnosis not present

## 2020-12-28 DIAGNOSIS — M797 Fibromyalgia: Secondary | ICD-10-CM | POA: Diagnosis not present

## 2020-12-28 DIAGNOSIS — Z87891 Personal history of nicotine dependence: Secondary | ICD-10-CM | POA: Diagnosis not present

## 2020-12-28 DIAGNOSIS — I129 Hypertensive chronic kidney disease with stage 1 through stage 4 chronic kidney disease, or unspecified chronic kidney disease: Secondary | ICD-10-CM | POA: Diagnosis not present

## 2020-12-28 DIAGNOSIS — Z8673 Personal history of transient ischemic attack (TIA), and cerebral infarction without residual deficits: Secondary | ICD-10-CM | POA: Diagnosis not present

## 2020-12-28 DIAGNOSIS — N183 Chronic kidney disease, stage 3 unspecified: Secondary | ICD-10-CM | POA: Diagnosis not present

## 2020-12-28 DIAGNOSIS — F1910 Other psychoactive substance abuse, uncomplicated: Secondary | ICD-10-CM | POA: Diagnosis not present

## 2020-12-28 DIAGNOSIS — Z90710 Acquired absence of both cervix and uterus: Secondary | ICD-10-CM | POA: Diagnosis not present

## 2020-12-28 DIAGNOSIS — Z20822 Contact with and (suspected) exposure to covid-19: Secondary | ICD-10-CM | POA: Diagnosis not present

## 2020-12-31 DIAGNOSIS — M9901 Segmental and somatic dysfunction of cervical region: Secondary | ICD-10-CM | POA: Diagnosis not present

## 2020-12-31 DIAGNOSIS — M9902 Segmental and somatic dysfunction of thoracic region: Secondary | ICD-10-CM | POA: Diagnosis not present

## 2020-12-31 DIAGNOSIS — R519 Headache, unspecified: Secondary | ICD-10-CM | POA: Diagnosis not present

## 2020-12-31 DIAGNOSIS — M5134 Other intervertebral disc degeneration, thoracic region: Secondary | ICD-10-CM | POA: Diagnosis not present

## 2021-01-07 DIAGNOSIS — R519 Headache, unspecified: Secondary | ICD-10-CM | POA: Diagnosis not present

## 2021-01-07 DIAGNOSIS — M5134 Other intervertebral disc degeneration, thoracic region: Secondary | ICD-10-CM | POA: Diagnosis not present

## 2021-01-07 DIAGNOSIS — M9901 Segmental and somatic dysfunction of cervical region: Secondary | ICD-10-CM | POA: Diagnosis not present

## 2021-01-07 DIAGNOSIS — M9902 Segmental and somatic dysfunction of thoracic region: Secondary | ICD-10-CM | POA: Diagnosis not present

## 2021-01-21 DIAGNOSIS — M9902 Segmental and somatic dysfunction of thoracic region: Secondary | ICD-10-CM | POA: Diagnosis not present

## 2021-01-21 DIAGNOSIS — M5134 Other intervertebral disc degeneration, thoracic region: Secondary | ICD-10-CM | POA: Diagnosis not present

## 2021-01-21 DIAGNOSIS — R519 Headache, unspecified: Secondary | ICD-10-CM | POA: Diagnosis not present

## 2021-01-21 DIAGNOSIS — M9901 Segmental and somatic dysfunction of cervical region: Secondary | ICD-10-CM | POA: Diagnosis not present

## 2021-01-28 ENCOUNTER — Encounter: Payer: Self-pay | Admitting: Urology

## 2021-01-28 ENCOUNTER — Ambulatory Visit: Payer: Medicare HMO | Admitting: Urology

## 2021-01-28 ENCOUNTER — Other Ambulatory Visit: Payer: Self-pay

## 2021-01-28 VITALS — BP 152/83 | HR 93

## 2021-01-28 DIAGNOSIS — R3129 Other microscopic hematuria: Secondary | ICD-10-CM

## 2021-01-28 DIAGNOSIS — R319 Hematuria, unspecified: Secondary | ICD-10-CM

## 2021-01-28 LAB — URINALYSIS, COMPLETE
Bilirubin, UA: NEGATIVE
Glucose, UA: NEGATIVE
Ketones, UA: NEGATIVE
Nitrite, UA: NEGATIVE
Specific Gravity, UA: 1.02 (ref 1.005–1.030)
Urobilinogen, Ur: 0.2 mg/dL (ref 0.2–1.0)
pH, UA: 5 (ref 5.0–7.5)

## 2021-01-28 LAB — MICROSCOPIC EXAMINATION: Bacteria, UA: NONE SEEN

## 2021-01-28 NOTE — Progress Notes (Signed)
01/28/2021 11:33 AM   Vanita Panda 12/20/1955 [id number]  Referring provider: Alba Cory, MD [address] [place],  [place] [address]  Chief Complaint  Patient presents with  . Hematuria    HPI: I was consulted to assess the patient's microscopic hematuria.  No gross hematuria.  Smoking history.  No daily aspirin blood thinner  She has uncommon incontinence and does not wear a pad.  She voids every hour during the day and gets up 1-3 times at night  No history of bladder infections or bladder surgery   PMH: Past Medical History:  Diagnosis Date  . Allergy   . Anxiety   . Chronic obstructive airway disease (HCC)   . Dizziness and giddiness   . Esophageal reflux   . Insomnia   . Lumbago   . Lumbosacral neuritis   . Pure hypercholesterolemia     Surgical History: Past Surgical History:  Procedure Laterality Date  . BREAST EXCISIONAL BIOPSY Left 1996   neg    Home Medications:  Allergies as of 01/28/2021      Reactions   Penicillins Swelling   Simvastatin    Zoloft [sertraline Hcl] Other (See Comments)   Shop lift      Medication List       Accurate as of January 28, 2021 11:33 AM. If you have any questions, ask your nurse or doctor.        albuterol 108 (90 Base) MCG/ACT inhaler Commonly known as: VENTOLIN HFA INHALE 2 PUFFS BY MOUTH EVERY 6 HOURS AS NEEDED FOR WHEEZING OR SHORTNESS OF BREATH   busPIRone 5 MG tablet Commonly known as: BUSPAR Take 1 tablet (5 mg total) by mouth 3 (three) times daily. Most of 3 times daily   cyclobenzaprine 5 MG tablet Commonly known as: FLEXERIL Take 1 tablet (5 mg total) by mouth 3 (three) times daily as needed for muscle spasms.   fluticasone 50 MCG/ACT nasal spray Commonly known as: FLONASE Use 2 spray(s) in each nostril once daily   loratadine 10 MG tablet Commonly known as: CLARITIN Take 1 tablet (10 mg total) by mouth daily.   losartan 50 MG tablet Commonly known as:  COZAAR Take 1 tablet (50 mg total) by mouth daily.   meclizine 12.5 MG tablet Commonly known as: ANTIVERT Take 1 tablet (12.5 mg total) by mouth 3 (three) times daily as needed for dizziness.   montelukast 10 MG tablet Commonly known as: SINGULAIR Take 1 tablet (10 mg total) by mouth at bedtime.   rosuvastatin 10 MG tablet Commonly known as: CRESTOR Take 1 tablet (10 mg total) by mouth daily.   Vitamin D 50 MCG (2000 UT) Caps Take 1 capsule (2,000 Units total) by mouth daily.       Allergies:  Allergies  Allergen Reactions  . Penicillins Swelling  . Simvastatin   . Zoloft [Sertraline Hcl] Other (See Comments)    Shop lift    Family History: Family History  Problem Relation Age of Onset  . Breast cancer Mother 56  . Alzheimer's disease Mother   . Kidney disease Mother   . Breast cancer Maternal Aunt 60  . Breast cancer Paternal Grandmother   . Polycythemia Maternal Grandmother     Social History:  reports that she quit smoking about 6 months ago. Her smoking use included e-cigarettes. She has a 9.50 pack-year smoking history. She has never used smokeless tobacco. She reports that she does not drink alcohol and does not use drugs.  ROS:                                        Physical Exam: BP (!) 152/83   Pulse 93   Constitutional:  Alert and oriented, No acute distress. HEENT: [place] AT, moist mucus membranes.  Trachea midline, no masses. Cardiovascular: No clubbing, cyanosis, or edema. Respiratory: Normal respiratory effort, no increased work of breathing. GI: Abdomen is soft, nontender, nondistended, no abdominal masses GU: No CVA tenderness.  Skin: No rashes, bruises or suspicious lesions. Lymph: No cervical or inguinal adenopathy. Neurologic: Grossly intact, no focal deficits, moving all 4 extremities. Psychiatric: Normal mood and affect.  Laboratory Data: Lab Results  Component Value Date   WBC 5.2 12/11/2020   HGB 13.5  12/11/2020   HCT 40.6 12/11/2020   MCV 86.9 12/11/2020   PLT 316 12/11/2020    Lab Results  Component Value Date   CREATININE 0.63 12/11/2020    No results found for: PSA  No results found for: TESTOSTERONE  Lab Results  Component Value Date   HGBA1C 5.7 (H) 12/11/2020    Urinalysis    Component Value Date/Time   COLORURINE Colorless 10/18/2011 2228   APPEARANCEUR Clear 10/18/2011 2228   LABSPEC 1.003 10/18/2011 2228   PHURINE 6.0 10/18/2011 2228   GLUCOSEU Negative 10/18/2011 2228   HGBUR 3+ 10/18/2011 2228   BILIRUBINUR negative 05/24/2020 1256   BILIRUBINUR Negative 10/18/2011 2228   KETONESUR Negative 10/18/2011 2228   PROTEINUR Negative 05/24/2020 1256   PROTEINUR Negative 10/18/2011 2228   UROBILINOGEN 0.2 05/24/2020 1256   NITRITE negative 05/24/2020 1256   NITRITE Negative 10/18/2011 2228   LEUKOCYTESUR Negative 05/24/2020 1256   LEUKOCYTESUR Negative 10/18/2011 2228    Pertinent Imaging: Chart reviewed.  Urine positive.  Urine sent for culture.  Assessment & Plan: Patient will return for CT scan and cystoscopy.  We will proceed accordingly.  1. Hematuria, unspecified type  - Urinalysis, Complete   No follow-ups on file.  Martina Sinner, MD  [facility] [address], [address] [place], [place] [address] [phone]

## 2021-01-28 NOTE — Patient Instructions (Signed)
Cystoscopy Cystoscopy is a procedure that is used to help diagnose and sometimes treat conditions that affect the lower urinary tract. The lower urinary tract includes the bladder and the urethra. The urethra is the tube that drains urine from the bladder. Cystoscopy is done using a thin, tube-shaped instrument with a light and camera at the end (cystoscope). The cystoscope may be hard or flexible, depending on the goal of the procedure. The cystoscope is inserted through the urethra, into the bladder. Cystoscopy may be recommended if you have:  Urinary tract infections that keep coming back.  Blood in the urine (hematuria).  An inability to control when you urinate (urinary incontinence) or an overactive bladder.  Unusual cells found in a urine sample.  A blockage in the urethra, such as a urinary stone.  Painful urination.  An abnormality in the bladder found during an intravenous pyelogram (IVP) or CT scan. Cystoscopy may also be done to remove a sample of tissue to be examined under a microscope (biopsy). What are the risks? Generally, this is a safe procedure. However, problems may occur, including:  Infection.  Bleeding.  What happens during the procedure?  1. You will be given one or more of the following: ? A medicine to numb the area (local anesthetic). 2. The area around the opening of your urethra will be cleaned. 3. The cystoscope will be passed through your urethra into your bladder. 4. Germ-free (sterile) fluid will flow through the cystoscope to fill your bladder. The fluid will stretch your bladder so that your health care provider can clearly examine your bladder walls. 5. Your doctor will look at the urethra and bladder. 6. The cystoscope will be removed The procedure may vary among health care providers  What can I expect after the procedure? After the procedure, it is common to have: 1. Some soreness or pain in your abdomen and urethra. 2. Urinary symptoms.  These include: ? Mild pain or burning when you urinate. Pain should stop within a few minutes after you urinate. This may last for up to 1 week. ? A small amount of blood in your urine for several days. ? Feeling like you need to urinate but producing only a small amount of urine. Follow these instructions at home: General instructions  Return to your normal activities as told by your health care provider.   Do not drive for 24 hours if you were given a sedative during your procedure.  Watch for any blood in your urine. If the amount of blood in your urine increases, call your health care provider.  If a tissue sample was removed for testing (biopsy) during your procedure, it is up to you to get your test results. Ask your health care provider, or the department that is doing the test, when your results will be ready.  Drink enough fluid to keep your urine pale yellow.  Keep all follow-up visits as told by your health care provider. This is important. Contact a health care provider if you:  Have pain that gets worse or does not get better with medicine, especially pain when you urinate.  Have trouble urinating.  Have more blood in your urine. Get help right away if you:  Have blood clots in your urine.  Have abdominal pain.  Have a fever or chills.  Are unable to urinate. Summary  Cystoscopy is a procedure that is used to help diagnose and sometimes treat conditions that affect the lower urinary tract.  Cystoscopy is done using   a thin, tube-shaped instrument with a light and camera at the end.  After the procedure, it is common to have some soreness or pain in your abdomen and urethra.  Watch for any blood in your urine. If the amount of blood in your urine increases, call your health care provider.  If you were prescribed an antibiotic medicine, take it as told by your health care provider. Do not stop taking the antibiotic even if you start to feel better. This  information is not intended to replace advice given to you by your health care provider. Make sure you discuss any questions you have with your health care provider. Document Revised: 09/14/2018 Document Reviewed: 09/14/2018 Elsevier Patient Education  2020 Elsevier Inc[employer]   

## 2021-01-31 LAB — CULTURE, URINE COMPREHENSIVE

## 2021-02-06 DIAGNOSIS — H35033 Hypertensive retinopathy, bilateral: Secondary | ICD-10-CM | POA: Diagnosis not present

## 2021-02-14 ENCOUNTER — Telehealth (INDEPENDENT_AMBULATORY_CARE_PROVIDER_SITE_OTHER): Payer: Medicare HMO | Admitting: Family Medicine

## 2021-02-14 ENCOUNTER — Encounter: Payer: Self-pay | Admitting: Family Medicine

## 2021-02-14 VITALS — Ht 60.0 in | Wt 130.0 lb

## 2021-02-14 DIAGNOSIS — J019 Acute sinusitis, unspecified: Secondary | ICD-10-CM

## 2021-02-14 DIAGNOSIS — K047 Periapical abscess without sinus: Secondary | ICD-10-CM

## 2021-02-14 MED ORDER — LEVOFLOXACIN 500 MG PO TABS: 500.0000 mg | ORAL_TABLET | Freq: Every day | ORAL | 0 refills | Status: AC

## 2021-02-14 NOTE — Progress Notes (Signed)
Name: Kristine Lowe   MRN: [id number]    DOB: [birth year]   Date:02/14/2021       Progress Note  Subjective  Chief Complaint  Chief Complaint  Patient presents with  . Sinusitis  . Dental Pain    Filling out/poss abscess     I connected with  Vanita Panda  on 02/14/21 at  9:00 AM EDT by a video enabled telemedicine application and verified that I am speaking with the correct person using two identifiers.  I discussed the limitations of evaluation and management by telemedicine and the availability of in person appointments. The patient expressed understanding and agreed to proceed with the virtual visit  Staff also discussed with the patient that there may be a patient responsible charge related to this service. Patient Location: at home  Provider Location: [facility] Additional Individuals present: alone   HPI  Sinus pressure: she states symptoms started a couple of weeks ago, she noticed nasal drainage that was yellow when leaning forward, also had a weird sense of smell, this past weekend she woke up with a headache on frontal area, she took sinus mediation otc and pain improved temporarily but never went away. She states drainage is more clear, but has some right sinus pressure and headache. No fever or chills. No nausea or vomiting. Or change in appetite. She states about one month ago a piece of a filling fell off her right upper tooth, and a few days ago the rest of the filling fell off. She has some other missing teeth.    Patient Active Problem List   Diagnosis Date Noted  . Leg pain 04/08/2020  . Dyslipidemia 06/29/2019  . History of gestational diabetes 02/13/2017  . Avitaminosis D 02/15/2016  . Obstructive apnea 02/15/2016  . Blood glucose elevated 02/15/2016  . Abnormal LFTs 02/15/2016  . Dermatitis, eczematoid 02/15/2016  . Generalized anxiety disorder 10/11/2015  . Simple chronic bronchitis (HCC) 10/11/2015  . Chronic back pain 10/11/2015  . Chronic  constipation 10/11/2015  . Reflux esophagitis 05/01/2007    Past Surgical History:  Procedure Laterality Date  . BREAST EXCISIONAL BIOPSY Left 1996   neg    Family History  Problem Relation Age of Onset  . Breast cancer Mother 16  . Alzheimer's disease Mother   . Kidney disease Mother   . Breast cancer Maternal Aunt 60  . Breast cancer Paternal Grandmother   . Polycythemia Maternal Grandmother     Social History   Socioeconomic History  . Marital status: Married    Spouse name: Not on file  . Number of children: 7  . Years of education: Not on file  . Highest education level: Some college, no degree  Occupational History  . Occupation: member Teaching laboratory technician   Tobacco Use  . Smoking status: Current Some Day Smoker    Packs/day: 0.25    Years: 38.00    Pack years: 9.50    Types: E-cigarettes, Cigarettes  . Smokeless tobacco: Current User  Vaping Use  . Vaping Use: Former  Substance and Sexual Activity  . Alcohol use: No    Alcohol/week: 0.0 standard drinks  . Drug use: No  . Sexual activity: Not Currently  Other Topics Concern  . Not on file  Social History Narrative   Married , has 7 grown children, working part time   International aid/development worker of Corporate investment banker Strain: Low Risk   . Difficulty of Paying Living Expenses: Not hard at all  Food  Insecurity: No Food Insecurity  . Worried About Programme researcher, broadcasting/film/video in the Last Year: Never true  . Ran Out of Food in the Last Year: Never true  Transportation Needs: No Transportation Needs  . Lack of Transportation (Medical): No  . Lack of Transportation (Non-Medical): No  Physical Activity: Inactive  . Days of Exercise per Week: 0 days  . Minutes of Exercise per Session: 0 min  Stress: Stress Concern Present  . Feeling of Stress : To some extent  Social Connections: Socially Integrated  . Frequency of Communication with Friends and Family: More than three times a week  . Frequency of Social Gatherings with  Friends and Family: Once a week  . Attends Religious Services: More than 4 times per year  . Active Member of Clubs or Organizations: Yes  . Attends Banker Meetings: More than 4 times per year  . Marital Status: Married  Catering manager Violence: Not At Risk  . Fear of Current or Ex-Partner: No  . Emotionally Abused: No  . Physically Abused: No  . Sexually Abused: No     Current Outpatient Medications:  .  albuterol (VENTOLIN HFA) 108 (90 Base) MCG/ACT inhaler, INHALE 2 PUFFS BY MOUTH EVERY 6 HOURS AS NEEDED FOR WHEEZING OR SHORTNESS OF BREATH, Disp: 18 g, Rfl: 1 .  busPIRone (BUSPAR) 5 MG tablet, Take 1 tablet (5 mg total) by mouth 3 (three) times daily. Most of 3 times daily, Disp: 90 tablet, Rfl: 5 .  Cholecalciferol (VITAMIN D) 50 MCG (2000 UT) CAPS, Take 1 capsule (2,000 Units total) by mouth daily., Disp: 30 capsule, Rfl: 0 .  cyclobenzaprine (FLEXERIL) 5 MG tablet, Take 1 tablet (5 mg total) by mouth 3 (three) times daily as needed for muscle spasms., Disp: 90 tablet, Rfl: 0 .  fluticasone (FLONASE) 50 MCG/ACT nasal spray, Use 2 spray(s) in each nostril once daily, Disp: 16 g, Rfl: 0 .  loratadine (CLARITIN) 10 MG tablet, Take 1 tablet (10 mg total) by mouth daily., Disp: 90 tablet, Rfl: 1 .  losartan (COZAAR) 50 MG tablet, Take 1 tablet (50 mg total) by mouth daily., Disp: 90 tablet, Rfl: 1 .  meclizine (ANTIVERT) 12.5 MG tablet, Take 1 tablet (12.5 mg total) by mouth 3 (three) times daily as needed for dizziness., Disp: 30 tablet, Rfl: 0 .  montelukast (SINGULAIR) 10 MG tablet, Take 1 tablet (10 mg total) by mouth at bedtime., Disp: 90 tablet, Rfl: 1 .  rosuvastatin (CRESTOR) 10 MG tablet, Take 1 tablet (10 mg total) by mouth daily., Disp: 90 tablet, Rfl: 1  Allergies  Allergen Reactions  . Penicillins Swelling  . Simvastatin   . Zoloft [Sertraline Hcl] Other (See Comments)    Shop lift    I personally reviewed active problem list, medication list, allergies,  family history, social history, health maintenance with the patient/caregiver today.   ROS  Ten systems reviewed and is negative except as mentioned in HPI   Objective  Virtual encounter, vitals not obtained.  Body mass index is 25.39 kg/m.  Physical Exam  Awake, alert and oriented Missing teeth, swelling around upper tooth, but no redness noticed on video   PHQ2/9: Depression screen [facility] 2/9 02/14/2021 12/11/2020 09/04/2020 06/26/2020 05/24/2020  Decreased Interest 0 0 0 0 0  Down, Depressed, Hopeless 0 0 0 0 0  PHQ - 2 Score 0 0 0 0 0  Altered sleeping - - 0 0 -  Tired, decreased energy - - 0 0 -  Change in appetite - - 0 0 -  Feeling bad or failure about yourself  - - 0 0 -  Trouble concentrating - - (No Data) 0 -  Moving slowly or fidgety/restless - - 0 0 -  Suicidal thoughts - - 0 0 -  PHQ-9 Score - - 0 0 -  Difficult doing work/chores - - - - -   PHQ-2/9 Result is negative.    Fall Risk: Fall Risk  02/14/2021 12/11/2020 09/04/2020 06/26/2020 05/24/2020  Falls in the past year? 0 0 0 0 0  Number falls in past yr: 0 0 0 0 0  Injury with Fall? 0 0 0 0 0  Risk for fall due to : - - - - No Fall Risks  Follow up Falls prevention discussed - - - Falls prevention discussed     Assessment & Plan  1. Subacute sinusitis, unspecified location  - levofloxacin (LEVAQUIN) 500 MG tablet; Take 1 tablet (500 mg total) by mouth daily for 7 days.  Dispense: 7 tablet; Refill: 0  2. Tooth abscess  - levofloxacin (LEVAQUIN) 500 MG tablet; Take 1 tablet (500 mg total) by mouth daily for 7 days.  Dispense: 7 tablet; Refill: 0 Advised to contact dentist   I discussed the assessment and treatment plan with the patient. The patient was provided an opportunity to ask questions and all were answered. The patient agreed with the plan and demonstrated an understanding of the instructions.  The patient was advised to call back or seek an in-person evaluation if the symptoms worsen or if the  condition fails to improve as anticipated.  I provided 15  minutes of non-face-to-face time during this encounter.

## 2021-02-15 ENCOUNTER — Telehealth: Payer: Medicare HMO | Admitting: Family Medicine

## 2021-02-18 DIAGNOSIS — M9902 Segmental and somatic dysfunction of thoracic region: Secondary | ICD-10-CM | POA: Diagnosis not present

## 2021-02-18 DIAGNOSIS — M5134 Other intervertebral disc degeneration, thoracic region: Secondary | ICD-10-CM | POA: Diagnosis not present

## 2021-02-18 DIAGNOSIS — M9901 Segmental and somatic dysfunction of cervical region: Secondary | ICD-10-CM | POA: Diagnosis not present

## 2021-02-18 DIAGNOSIS — R519 Headache, unspecified: Secondary | ICD-10-CM | POA: Diagnosis not present

## 2021-02-19 ENCOUNTER — Other Ambulatory Visit: Payer: Self-pay

## 2021-02-19 ENCOUNTER — Ambulatory Visit
Admission: RE | Admit: 2021-02-19 | Discharge: 2021-02-19 | Disposition: A | Discharge status: Home / Self Care | Payer: Medicare HMO | Source: Ambulatory Visit | Attending: Urology | Admitting: Urology

## 2021-02-19 DIAGNOSIS — R319 Hematuria, unspecified: Secondary | ICD-10-CM

## 2021-02-19 DIAGNOSIS — K5750 Diverticulosis of both small and large intestine without perforation or abscess without bleeding: Secondary | ICD-10-CM | POA: Diagnosis not present

## 2021-02-19 DIAGNOSIS — K449 Diaphragmatic hernia without obstruction or gangrene: Secondary | ICD-10-CM | POA: Diagnosis not present

## 2021-02-19 DIAGNOSIS — N200 Calculus of kidney: Secondary | ICD-10-CM | POA: Diagnosis not present

## 2021-02-19 HISTORY — DX: Essential (primary) hypertension: I10

## 2021-02-19 MED ORDER — IOHEXOL 300 MG/ML  SOLN
125.0000 mL | Freq: Once | INTRAMUSCULAR | Status: AC | PRN
  Administered 2021-02-19: 125 mL via INTRAVENOUS

## 2021-02-21 LAB — POCT I-STAT CREATININE: Creatinine, Ser: 0.6 mg/dL (ref 0.44–1.00)

## 2021-02-25 ENCOUNTER — Encounter: Payer: Self-pay | Admitting: Urology

## 2021-02-25 ENCOUNTER — Ambulatory Visit: Payer: Medicare HMO | Admitting: Urology

## 2021-02-25 ENCOUNTER — Other Ambulatory Visit: Payer: Self-pay

## 2021-02-25 VITALS — BP 143/80 | HR 106 | Ht 60.0 in | Wt 140.0 lb

## 2021-02-25 DIAGNOSIS — R3129 Other microscopic hematuria: Secondary | ICD-10-CM

## 2021-02-25 DIAGNOSIS — R319 Hematuria, unspecified: Secondary | ICD-10-CM

## 2021-02-25 DIAGNOSIS — N3946 Mixed incontinence: Secondary | ICD-10-CM

## 2021-02-25 NOTE — Progress Notes (Signed)
02/25/2021 11:28 AM   Kristine Lowe [birth year] [id number]  Referring provider: Alba Cory, MD [address] [place],  [place] [address]  Chief Complaint  Patient presents with  . Cysto    HPI: I was consulted to assess the patient's microscopic hematuria.  No gross hematuria.  Smoking history.  No daily aspirin blood thinner  She has uncommon incontinence and does not wear a pad.  She voids every hour during the day and gets up 1-3 times at night  Today Frequency is stable Last culture negative.  Small stone superior pole left kidney on recent CT scan Cystoscopy: Patient underwent flexible cystoscopy.  Bladder mucosa and trigone were normal.  No stitch or foreign body or carcinoma.  Patient tolerated the procedure well.   PMH: Past Medical History:  Diagnosis Date  . Allergy   . Anxiety   . Chronic obstructive airway disease (HCC)   . Dizziness and giddiness   . Esophageal reflux   . Hypertension   . Insomnia   . Lumbago   . Lumbosacral neuritis   . Pure hypercholesterolemia     Surgical History: Past Surgical History:  Procedure Laterality Date  . BREAST EXCISIONAL BIOPSY Left 1996   neg    Home Medications:  Allergies as of 02/25/2021      Reactions   Penicillins Swelling   Simvastatin    Zoloft [sertraline Hcl] Other (See Comments)   Shop lift      Medication List       Accurate as of Feb 25, 2021 11:28 AM. If you have any questions, ask your nurse or doctor.        albuterol 108 (90 Base) MCG/ACT inhaler Commonly known as: VENTOLIN HFA INHALE 2 PUFFS BY MOUTH EVERY 6 HOURS AS NEEDED FOR WHEEZING OR SHORTNESS OF BREATH   busPIRone 5 MG tablet Commonly known as: BUSPAR Take 1 tablet (5 mg total) by mouth 3 (three) times daily. Most of 3 times daily   cyclobenzaprine 5 MG tablet Commonly known as: FLEXERIL Take 1 tablet (5 mg total) by mouth 3 (three) times daily as needed for muscle spasms.   fluticasone 50 MCG/ACT  nasal spray Commonly known as: FLONASE Use 2 spray(s) in each nostril once daily   loratadine 10 MG tablet Commonly known as: CLARITIN Take 1 tablet (10 mg total) by mouth daily.   losartan 50 MG tablet Commonly known as: COZAAR Take 1 tablet (50 mg total) by mouth daily.   meclizine 12.5 MG tablet Commonly known as: ANTIVERT Take 1 tablet (12.5 mg total) by mouth 3 (three) times daily as needed for dizziness.   montelukast 10 MG tablet Commonly known as: SINGULAIR Take 1 tablet (10 mg total) by mouth at bedtime.   rosuvastatin 10 MG tablet Commonly known as: CRESTOR Take 1 tablet (10 mg total) by mouth daily.   Vitamin D 50 MCG (2000 UT) Caps Take 1 capsule (2,000 Units total) by mouth daily.       Allergies:  Allergies  Allergen Reactions  . Penicillins Swelling  . Simvastatin   . Zoloft [Sertraline Hcl] Other (See Comments)    Shop lift    Family History: Family History  Problem Relation Age of Onset  . Breast cancer Mother 1  . Alzheimer's disease Mother   . Kidney disease Mother   . Breast cancer Maternal Aunt 60  . Breast cancer Paternal Grandmother   . Polycythemia Maternal Grandmother     Social History:  reports  that she has been smoking e-cigarettes and cigarettes. She has a 9.50 pack-year smoking history. She uses smokeless tobacco. She reports that she does not drink alcohol and does not use drugs.  ROS:                                        Physical Exam: There were no vitals taken for this visit.  Constitutional:  Alert and oriented, No acute distress.   Laboratory Data: Lab Results  Component Value Date   WBC 5.2 12/11/2020   HGB 13.5 12/11/2020   HCT 40.6 12/11/2020   MCV 86.9 12/11/2020   PLT 316 12/11/2020    Lab Results  Component Value Date   CREATININE 0.60 02/19/2021    No results found for: PSA  No results found for: TESTOSTERONE  Lab Results  Component Value Date   HGBA1C 5.7 (H)  12/11/2020    Urinalysis    Component Value Date/Time   COLORURINE Colorless 10/18/2011 2228   APPEARANCEUR Cloudy (A) 01/28/2021 1102   LABSPEC 1.003 10/18/2011 2228   PHURINE 6.0 10/18/2011 2228   GLUCOSEU Negative 01/28/2021 1102   GLUCOSEU Negative 10/18/2011 2228   HGBUR 3+ 10/18/2011 2228   BILIRUBINUR Negative 01/28/2021 1102   BILIRUBINUR Negative 10/18/2011 2228   KETONESUR Negative 10/18/2011 2228   PROTEINUR Trace (A) 01/28/2021 1102   PROTEINUR Negative 10/18/2011 2228   UROBILINOGEN 0.2 05/24/2020 1256   NITRITE Negative 01/28/2021 1102   NITRITE Negative 10/18/2011 2228   LEUKOCYTESUR Trace (A) 01/28/2021 1102   LEUKOCYTESUR Negative 10/18/2011 2228    Pertinent Imaging:   Assessment & Plan: Reassurance given.  I will see the patient as needed  1. Hematuria, unspecified type  - Urinalysis, Complete   No follow-ups on file.  Martina Sinner, MD  [facility] [address] [place], [place] [address] [phone]

## 2021-02-26 LAB — MICROSCOPIC EXAMINATION

## 2021-02-26 LAB — URINALYSIS, COMPLETE
Bilirubin, UA: NEGATIVE
Glucose, UA: NEGATIVE
Ketones, UA: NEGATIVE
Leukocytes,UA: NEGATIVE
Nitrite, UA: NEGATIVE
Protein,UA: NEGATIVE
Specific Gravity, UA: 1.015 (ref 1.005–1.030)
Urobilinogen, Ur: 0.2 mg/dL (ref 0.2–1.0)
pH, UA: 5 (ref 5.0–7.5)

## 2021-03-12 ENCOUNTER — Other Ambulatory Visit: Payer: Self-pay | Admitting: Family Medicine

## 2021-03-12 DIAGNOSIS — R0981 Nasal congestion: Secondary | ICD-10-CM

## 2021-03-14 DIAGNOSIS — Z0100 Encounter for examination of eyes and vision without abnormal findings: Secondary | ICD-10-CM | POA: Diagnosis not present

## 2021-03-14 DIAGNOSIS — H524 Presbyopia: Secondary | ICD-10-CM | POA: Diagnosis not present

## 2021-03-19 DIAGNOSIS — R519 Headache, unspecified: Secondary | ICD-10-CM | POA: Diagnosis not present

## 2021-03-19 DIAGNOSIS — M5134 Other intervertebral disc degeneration, thoracic region: Secondary | ICD-10-CM | POA: Diagnosis not present

## 2021-03-19 DIAGNOSIS — M9901 Segmental and somatic dysfunction of cervical region: Secondary | ICD-10-CM | POA: Diagnosis not present

## 2021-03-19 DIAGNOSIS — M9902 Segmental and somatic dysfunction of thoracic region: Secondary | ICD-10-CM | POA: Diagnosis not present

## 2021-04-01 NOTE — Progress Notes (Signed)
Name: Kristine Lowe   MRN: [id number]    DOB: [birth year]   Date:04/02/2021       Progress Note  Subjective  Chief Complaint  Shingles  HPI  Shinglex: she noticed like an insect bit her on right mid back about 10 days ago, she noticed a rash about a week ago, not very painful, but she is concerned because her daughter just had a baby ( boy ) and she was there for  the delivery and helped her daughter up to two days ago. The baby is fine. She states her rash is not spreading but is not completely healed and would like to be treated for the throbbing sensation and itching She never had shingles vaccine   Sinus drainage: she was treated for sinusitis and toothache back in May, tooth no longer hurts but she continues to have post-nasal drainage, clear in color, using flonase and loratadine, advised to see dentist and we will add astelin today   Patient Active Problem List   Diagnosis Date Noted   Leg pain 04/08/2020   Dyslipidemia 06/29/2019   History of gestational diabetes 02/13/2017   Avitaminosis D 02/15/2016   Obstructive apnea 02/15/2016   Blood glucose elevated 02/15/2016   Abnormal LFTs 02/15/2016   Dermatitis, eczematoid 02/15/2016   Generalized anxiety disorder 10/11/2015   Simple chronic bronchitis (HCC) 10/11/2015   Chronic back pain 10/11/2015   Chronic constipation 10/11/2015   Reflux esophagitis 05/01/2007    Past Surgical History:  Procedure Laterality Date   BREAST EXCISIONAL BIOPSY Left 1996   neg    Family History  Problem Relation Age of Onset   Breast cancer Mother 9   Alzheimer's disease Mother    Kidney disease Mother    Breast cancer Maternal Aunt 57   Breast cancer Paternal Grandmother    Polycythemia Maternal Grandmother     Social History   Tobacco Use   Smoking status: Some Days    Packs/day: 0.25    Years: 38.00    Pack years: 9.50    Types: E-cigarettes, Cigarettes   Smokeless tobacco: Current  Substance Use Topics   Alcohol  use: No    Alcohol/week: 0.0 standard drinks     Current Outpatient Medications:    albuterol (VENTOLIN HFA) 108 (90 Base) MCG/ACT inhaler, INHALE 2 PUFFS BY MOUTH EVERY 6 HOURS AS NEEDED FOR WHEEZING OR SHORTNESS OF BREATH, Disp: 18 g, Rfl: 1   azelastine (ASTELIN) 0.1 % nasal spray, Place 2 sprays into both nostrils 2 (two) times daily. Use in each nostril as directed, Disp: 30 mL, Rfl: 1   busPIRone (BUSPAR) 5 MG tablet, Take 1 tablet (5 mg total) by mouth 3 (three) times daily. Most of 3 times daily, Disp: 90 tablet, Rfl: 5   Cholecalciferol (VITAMIN D) 50 MCG (2000 UT) CAPS, Take 1 capsule (2,000 Units total) by mouth daily., Disp: 30 capsule, Rfl: 0   cyclobenzaprine (FLEXERIL) 5 MG tablet, Take 1 tablet (5 mg total) by mouth 3 (three) times daily as needed for muscle spasms., Disp: 90 tablet, Rfl: 0   fluticasone (FLONASE) 50 MCG/ACT nasal spray, Use 2 spray(s) in each nostril once daily, Disp: 16 g, Rfl: 0   loratadine (CLARITIN) 10 MG tablet, Take 1 tablet (10 mg total) by mouth daily., Disp: 90 tablet, Rfl: 1   losartan (COZAAR) 50 MG tablet, Take 1 tablet (50 mg total) by mouth daily., Disp: 90 tablet, Rfl: 1   meclizine (ANTIVERT) 12.5 MG tablet, Take 1 tablet (  12.5 mg total) by mouth 3 (three) times daily as needed for dizziness., Disp: 30 tablet, Rfl: 0   montelukast (SINGULAIR) 10 MG tablet, Take 1 tablet (10 mg total) by mouth at bedtime., Disp: 90 tablet, Rfl: 1   rosuvastatin (CRESTOR) 10 MG tablet, Take 1 tablet (10 mg total) by mouth daily., Disp: 90 tablet, Rfl: 1   valACYclovir (VALTREX) 1000 MG tablet, Take 1 tablet (1,000 mg total) by mouth 3 (three) times daily for 10 days., Disp: 21 tablet, Rfl: 0  Allergies  Allergen Reactions   Penicillins Swelling   Simvastatin    Zoloft [Sertraline Hcl] Other (See Comments)    Shop lift    I personally reviewed active problem list, medication list, allergies, family history, social history, health maintenance with the  patient/caregiver today.   ROS  Ten systems reviewed and is negative except as mentioned in HPI  Objective  Vitals:   04/02/21 1108  BP: 120/72  Pulse: 81  Resp: 16  Temp: 98.8 F (37.1 C)  TempSrc: Oral  SpO2: 98%  Weight: 132 lb (59.9 kg)  Height: 5' (1.524 m)    Body mass index is 25.78 kg/m.  Physical Exam  Constitutional: Patient appears well-developed and well-nourished. No distress.  HEENT: head atraumatic, normocephalic, pupils equal and reactive to light, eneck supple, throat within normal limits , no drainage, poor dentition with upper molars with cavities but no signs of infection at this time  Cardiovascular: Normal rate, regular rhythm and normal heart sounds.  No murmur heard. No BLE edema. Pulmonary/Chest: Effort normal and breath sounds normal. No respiratory distress. Abdominal: Soft.  There is no tenderness. Psychiatric: Patient has a normal mood and affect. behavior is normal. Judgment and thought content normal.  Skin: rash in a crop distribution on right flank area, erythematous base, no vesicles   Recent Results (from the past 2160 hour(s))  Urinalysis, Complete     Status: Abnormal   Collection Time: 01/28/21 11:02 AM  Result Value Ref Range   Specific Gravity, UA 1.020 1.005 - 1.030   pH, UA 5.0 5.0 - 7.5   Color, UA Yellow Yellow   Appearance Ur Cloudy (A) Clear   Leukocytes,UA Trace (A) Negative   Protein,UA Trace (A) Negative/Trace   Glucose, UA Negative Negative   Ketones, UA Negative Negative   RBC, UA 3+ (A) Negative   Bilirubin, UA Negative Negative   Urobilinogen, Ur 0.2 0.2 - 1.0 mg/dL   Nitrite, UA Negative Negative   Microscopic Examination See below:   Microscopic Examination     Status: Abnormal   Collection Time: 01/28/21 11:02 AM   Urine  Result Value Ref Range   WBC, UA 0-5 0 - 5 /hpf   RBC 11-30 (A) 0 - 2 /hpf   Epithelial Cells (non renal) 0-10 0 - 10 /hpf   Bacteria, UA None seen None seen/Few  CULTURE, URINE  COMPREHENSIVE     Status: None   Collection Time: 01/28/21 11:45 AM   Specimen: Urine   UR  Result Value Ref Range   Urine Culture, Comprehensive Final report    Organism ID, Bacteria Comment     Comment: Mixed urogenital flora 8,000  Colonies/mL   I-STAT creatinine     Status: None   Collection Time: 02/19/21 11:50 AM  Result Value Ref Range   Creatinine, Ser 0.60 0.44 - 1.00 mg/dL  Urinalysis, Complete     Status: Abnormal   Collection Time: 02/25/21 11:27 AM  Result Value Ref Range  Specific Gravity, UA 1.015 1.005 - 1.030   pH, UA 5.0 5.0 - 7.5   Color, UA Yellow Yellow   Appearance Ur Cloudy (A) Clear   Leukocytes,UA Negative Negative   Protein,UA Negative Negative/Trace   Glucose, UA Negative Negative   Ketones, UA Negative Negative   RBC, UA 3+ (A) Negative   Bilirubin, UA Negative Negative   Urobilinogen, Ur 0.2 0.2 - 1.0 mg/dL   Nitrite, UA Negative Negative   Microscopic Examination See below:   Microscopic Examination     Status: Abnormal   Collection Time: 02/25/21 11:27 AM   Urine  Result Value Ref Range   WBC, UA 0-5 0 - 5 /hpf   RBC 11-30 (A) 0 - 2 /hpf   Epithelial Cells (non renal) 0-10 0 - 10 /hpf   Bacteria, UA Few None seen/Few    PHQ2/9: Depression screen [facility] 2/9 04/02/2021 02/14/2021 12/11/2020 09/04/2020 06/26/2020  Decreased Interest 0 0 0 0 0  Down, Depressed, Hopeless 0 0 0 0 0  PHQ - 2 Score 0 0 0 0 0  Altered sleeping - - - 0 0  Tired, decreased energy - - - 0 0  Change in appetite - - - 0 0  Feeling bad or failure about yourself  - - - 0 0  Trouble concentrating - - - (No Data) 0  Moving slowly or fidgety/restless - - - 0 0  Suicidal thoughts - - - 0 0  PHQ-9 Score - - - 0 0  Difficult doing work/chores - - - - -    phq 9 is negative  Fall Risk: Fall Risk  04/02/2021 02/14/2021 12/11/2020 09/04/2020 06/26/2020  Falls in the past year? 0 0 0 0 0  Number falls in past yr: 0 0 0 0 0  Injury with Fall? 0 0 0 0 0  Risk for fall due to : - -  - - -  Follow up - Falls prevention discussed - - -     Functional Status Survey: Is the patient deaf or have difficulty hearing?: No Does the patient have difficulty seeing, even when wearing glasses/contacts?: No Does the patient have difficulty concentrating, remembering, or making decisions?: No Does the patient have difficulty walking or climbing stairs?: No Does the patient have difficulty dressing or bathing?: No Does the patient have difficulty doing errands alone such as visiting a doctor's office or shopping?: No    Assessment & Plan  1. Herpes zoster without complication  - valACYclovir (VALTREX) 1000 MG tablet; Take 1 tablet (1,000 mg total) by mouth 3 (three) times daily for 10 days.  Dispense: 21 tablet; Refill: 0  2. Sinus drainage  - azelastine (ASTELIN) 0.1 % nasal spray; Place 2 sprays into both nostrils 2 (two) times daily. Use in each nostril as directed  Dispense: 30 mL; Refill: 1

## 2021-04-02 ENCOUNTER — Encounter: Payer: Self-pay | Admitting: Family Medicine

## 2021-04-02 ENCOUNTER — Other Ambulatory Visit: Payer: Self-pay | Admitting: Family Medicine

## 2021-04-02 ENCOUNTER — Ambulatory Visit (INDEPENDENT_AMBULATORY_CARE_PROVIDER_SITE_OTHER): Payer: Medicare HMO | Admitting: Family Medicine

## 2021-04-02 ENCOUNTER — Telehealth: Payer: Self-pay

## 2021-04-02 ENCOUNTER — Other Ambulatory Visit: Payer: Self-pay

## 2021-04-02 VITALS — BP 120/72 | HR 81 | Temp 98.8°F | Resp 16 | Ht 60.0 in | Wt 132.0 lb

## 2021-04-02 DIAGNOSIS — B029 Zoster without complications: Secondary | ICD-10-CM | POA: Diagnosis not present

## 2021-04-02 DIAGNOSIS — J3489 Other specified disorders of nose and nasal sinuses: Secondary | ICD-10-CM | POA: Diagnosis not present

## 2021-04-02 MED ORDER — AZELASTINE HCL 0.1 % NA SOLN: 2.0000 | Freq: Two times a day (BID) | NASAL | 1 refills | Status: DC

## 2021-04-02 MED ORDER — VALACYCLOVIR HCL 1 G PO TABS: 1000.0000 mg | ORAL_TABLET | Freq: Three times a day (TID) | ORAL | 0 refills | Status: DC

## 2021-04-02 NOTE — Telephone Encounter (Signed)
Copied from CRM [id number]. Topic: General - Other >> Apr 02, 2021  3:15 PM Lowe, Kristine wrote: Reason for CRM: [facility] stated the directions does not match the quantity so they would need clarification whether the Rx is for 7 days (21 tablets) or 10 days (30 tablets)

## 2021-04-22 DIAGNOSIS — M5134 Other intervertebral disc degeneration, thoracic region: Secondary | ICD-10-CM | POA: Diagnosis not present

## 2021-04-22 DIAGNOSIS — M9902 Segmental and somatic dysfunction of thoracic region: Secondary | ICD-10-CM | POA: Diagnosis not present

## 2021-04-22 DIAGNOSIS — M9901 Segmental and somatic dysfunction of cervical region: Secondary | ICD-10-CM | POA: Diagnosis not present

## 2021-04-22 DIAGNOSIS — R519 Headache, unspecified: Secondary | ICD-10-CM | POA: Diagnosis not present

## 2021-05-17 ENCOUNTER — Other Ambulatory Visit: Payer: Self-pay | Admitting: Family Medicine

## 2021-05-17 DIAGNOSIS — B029 Zoster without complications: Secondary | ICD-10-CM

## 2021-05-17 DIAGNOSIS — E785 Hyperlipidemia, unspecified: Secondary | ICD-10-CM

## 2021-05-17 DIAGNOSIS — I10 Essential (primary) hypertension: Secondary | ICD-10-CM

## 2021-05-17 DIAGNOSIS — J301 Allergic rhinitis due to pollen: Secondary | ICD-10-CM

## 2021-05-17 DIAGNOSIS — R0981 Nasal congestion: Secondary | ICD-10-CM

## 2021-05-17 NOTE — Telephone Encounter (Signed)
Last seen 6.28.2022 upcoming appt 9.23.2022

## 2021-05-17 NOTE — Telephone Encounter (Signed)
Notes to clinic:  short supply of medication given Review for another refill   Requested Prescriptions  Pending Prescriptions Disp Refills   valACYclovir (VALTREX) 1000 MG tablet [Pharmacy Med Name: valACYclovir HCl 1 GM Oral Tablet] 21 tablet 0    Sig: TAKE 1 TABLET BY MOUTH THREE TIMES DAILY FOR 7 DAYS     Antimicrobials:  Antiviral Agents - Anti-Herpetic Passed - 05/17/2021 12:32 PM      Passed - Valid encounter within last 12 months    Recent Outpatient Visits           1 month ago Herpes zoster without complication   [facility], [facility]   3 months ago Subacute sinusitis, unspecified location   [facility], [facility]   5 months ago Asymptomatic microscopic hematuria   [facility], MD   8 months ago Asymptomatic microscopic hematuria   [facility], [facility]   10 months ago Simple chronic bronchitis [facility])   [facility] Alba Cory, MD       Future Appointments             In 1 week  [facility]   In 1 month Alba Cory, MD [facility]            Signed Prescriptions Disp Refills   fluticasone (FLONASE) 50 MCG/ACT nasal spray 16 g 0    Sig: Use 2 spray(s) in each nostril once daily     Ear, Nose, and Throat: Nasal Preparations - Corticosteroids Passed - 05/17/2021 12:32 PM      Passed - Valid encounter within last 12 months    Recent Outpatient Visits           1 month ago Herpes zoster without complication   [facility], MD   3 months ago Subacute sinusitis, unspecified location   [facility], [facility]   5 months ago Asymptomatic microscopic hematuria   [facility], MD   8 months ago Asymptomatic microscopic hematuria   [facility]  Alba Cory, MD   10 months ago Simple chronic bronchitis [facility])   [facility] Alba Cory, MD       Future Appointments             In 1 week  [facility]   In 1 month Sowles, [place], MD [facility]             EQ ALLERGY RELIEF 10 MG tablet 90 tablet 0    Sig: Take 1 tablet by mouth once daily     Ear, Nose, and Throat:  Antihistamines Passed - 05/17/2021 12:32 PM      Passed - Valid encounter within last 12 months    Recent Outpatient Visits           1 month ago Herpes zoster without complication   [facility] Alba Cory, MD   3 months ago Subacute sinusitis, unspecified location   [facility], [facility]   5 months ago Asymptomatic microscopic hematuria   [facility], MD   8 months ago Asymptomatic microscopic hematuria   [facility] Alba Cory, MD   10 months ago Simple chronic bronchitis [facility])   [facility]  Alba Cory, MD       Future Appointments             In 1 week  [facility]   In 1 month Alba Cory, MD [facility]             losartan (COZAAR) 50 MG tablet 90 tablet 0    Sig: Take 1 tablet by mouth once daily     Cardiovascular:  Angiotensin Receptor Blockers Passed - 05/17/2021 12:32 PM      Passed - Cr in normal range and within 180 days    Creat  Date Value Ref Range Status  12/11/2020 0.63 0.50 - 0.99 mg/dL Final    Comment:    For patients >30 years of age, the reference limit for Creatinine is approximately 13% higher for people identified as African-American. .    Creatinine, Ser  Date Value Ref Range Status  02/19/2021 0.60 0.44 - 1.00 mg/dL Final          Passed - K in normal range and within 180 days    Potassium  Date Value Ref Range Status  12/11/2020  4.8 3.5 - 5.3 mmol/L Final  02/20/2014 3.5 3.5 - 5.1 mmol/L Final          Passed - Patient is not pregnant      Passed - Last BP in normal range    BP Readings from Last 1 Encounters:  04/02/21 120/72          Passed - Valid encounter within last 6 months    Recent Outpatient Visits           1 month ago Herpes zoster without complication   [facility] Alba Cory, MD   3 months ago Subacute sinusitis, unspecified location   [facility]   5 months ago Asymptomatic microscopic hematuria   [facility], MD   8 months ago Asymptomatic microscopic hematuria   [facility] Al[facility], MD   10 months ago Simple chronic bronchitis [facility])   [facility] Alba Cory, MD       Future Appointments             In 1 week  [facility]   In 1 month Sowles, [place], MD [facility]             montelukast (SINGULAIR) 10 MG tablet 90 tablet 0    Sig: TAKE 1 TABLET BY MOUTH AT BEDTIME     Pulmonology:  Leukotriene Inhibitors Passed - 05/17/2021 12:32 PM      Passed - Valid encounter within last 12 months    Recent Outpatient Visits           1 month ago Herpes zoster without complication   [facility] Al[facility], MD   3 months ago Subacute sinusitis, unspecified location   [facility] Al[facility], MD   5 months ago Asymptomatic microscopic hematuria   [facility] Al[facility], MD   8 months ago Asymptomatic microscopic hematuria   [facility] Al[facility] Cory, MD   10 months ago Simple chronic bronchitis [facility])   [facility] Al[facility] Cory, MD       Future Appointments             In 1 week  [facility]   In 1 month Sowles, Danna Hefty, MD [facility]             rosuvastatin (CRESTOR) 10 MG tablet 90 tablet 0    Sig: Take 1 tablet by mouth once daily     Cardiovascular:  Antilipid - Statins Failed - 05/17/2021 12:32 PM      Failed - Total Cholesterol in normal range and within 360 days    Cholesterol  Date Value Ref Range Status  12/11/2020 290 (H) <200 mg/dL Final          Failed - LDL in normal range and within 360 days    LDL Cholesterol (Calc)  Date Value Ref Range Status  12/11/2020 195 (H) mg/dL (calc) Final    Comment:    LDL-C levels > or = 190 mg/dL may indicate familial  hypercholesterolemia (FH). Clinical assessment and  measurement of blood lipid levels should be  considered for all first degree relatives of  patients with an FH diagnosis.  For questions about testing for familial hypercholesterolemia, please call Engineer, materials at [email or web address]. Wardell Honour, et al. J National Lipid Association  Recommendations for Patient-Centered Management of  Dyslipidemia: Part 1 Journal of Clinical Lipidology  2015;9(2), 129-169. Reference range: <100 . Desirable range <100 mg/dL for primary prevention;   <70 mg/dL for patients with CHD or diabetic patients  with > or = 2 CHD risk factors. Marland Kitchen LDL-C is now calculated using the Martin-Hopkins  calculation, which is a validated novel method providing  better accuracy than the Friedewald equation in the  estimation of LDL-C.  Horald Pollen et al. Lenox Ahr. 2297;989(21): 2061-2068  (http://education.[employer].com/faq/FAQ164)           Failed - Triglycerides in normal range and within 360 days    Triglycerides  Date Value Ref Range Status  12/11/2020 250 (H) <150 mg/dL Final    Comment:    . If a non-fasting specimen was collected, consider repeat triglyceride testing on a fasting specimen if clinically indicated.  Perry Mount et al. J. of Clin. Lipidol. 2015;9:129-169. Marland Kitchen           Passed - HDL in normal range  and within 360 days    HDL  Date Value Ref Range Status  12/11/2020 50 > OR = 50 mg/dL Final          Passed - Patient is not pregnant      Passed - Valid encounter within last 12 months    Recent Outpatient Visits           1 month ago Herpes zoster without complication   [facility], MD   3 months ago Subacute sinusitis, unspecified location   [facility] Al[facility], MD   5 months ago Asymptomatic microscopic hematuria   [facility] Alba Cory, MD   8 months ago Asymptomatic microscopic hematuria   [facility] Al[facility] Cory, MD   10 months ago Simple chronic bronchitis [facility])   [facility] Alba Cory, MD       Future Appointments             In 1 week  [facility]   In 1 month Alba Cory, MD [facility]

## 2021-05-27 DIAGNOSIS — M9901 Segmental and somatic dysfunction of cervical region: Secondary | ICD-10-CM | POA: Diagnosis not present

## 2021-05-27 DIAGNOSIS — M9902 Segmental and somatic dysfunction of thoracic region: Secondary | ICD-10-CM | POA: Diagnosis not present

## 2021-05-27 DIAGNOSIS — M5134 Other intervertebral disc degeneration, thoracic region: Secondary | ICD-10-CM | POA: Diagnosis not present

## 2021-05-27 DIAGNOSIS — R519 Headache, unspecified: Secondary | ICD-10-CM | POA: Diagnosis not present

## 2021-05-28 ENCOUNTER — Other Ambulatory Visit: Payer: Self-pay | Admitting: Family Medicine

## 2021-05-28 ENCOUNTER — Ambulatory Visit (INDEPENDENT_AMBULATORY_CARE_PROVIDER_SITE_OTHER): Payer: Medicare HMO

## 2021-05-28 ENCOUNTER — Other Ambulatory Visit: Payer: Self-pay

## 2021-05-28 VITALS — BP 118/76 | HR 90 | Temp 98.1°F | Resp 16 | Ht 60.0 in | Wt 129.3 lb

## 2021-05-28 DIAGNOSIS — Z1231 Encounter for screening mammogram for malignant neoplasm of breast: Secondary | ICD-10-CM | POA: Diagnosis not present

## 2021-05-28 DIAGNOSIS — Z0000 Encounter for general adult medical examination without abnormal findings: Secondary | ICD-10-CM

## 2021-05-28 DIAGNOSIS — Z780 Asymptomatic menopausal state: Secondary | ICD-10-CM

## 2021-05-28 DIAGNOSIS — M62830 Muscle spasm of back: Secondary | ICD-10-CM

## 2021-05-28 MED ORDER — CYCLOBENZAPRINE HCL 5 MG PO TABS: 5.0000 mg | ORAL_TABLET | Freq: Three times a day (TID) | ORAL | 0 refills | Status: DC | PRN

## 2021-05-28 NOTE — Patient Instructions (Signed)
Kristine Lowe , Thank you for taking time to come for your Medicare Wellness Visit. I appreciate your ongoing commitment to your health goals. Please review the following plan we discussed and let me know if I can assist you in the future.   Screening recommendations/referrals: Colonoscopy: due Mammogram: done 09/20/20. Please call [phone] to schedule your mammogram and bone density screening.  Bone Density: done 09/20/19 Recommended yearly ophthalmology/optometry visit for glaucoma screening and checkup Recommended yearly dental visit for hygiene and checkup  Vaccinations: Influenza vaccine: done 06/26/20 Pneumococcal vaccine: done 05/24/20 Tdap vaccine: due Shingles vaccine: Shingrix discussed. Please contact your pharmacy for coverage information.  Covid-19:done 11/17/19, 12/15/19 & 09/15/20  Advanced directives: Advance directive discussed with you today. I have provided a copy for you to complete at home and have notarized. Once this is complete please bring a copy in to our office so we can scan it into your chart.   Conditions/risks identified: Recommend increasing physical activity to at least 3 days per week   Next appointment: Follow up in one year for your annual wellness visit    Preventive Care 65 Years and Older, Female Preventive care refers to lifestyle choices and visits with your health care provider that can promote health and wellness. What does preventive care include? A yearly physical exam. This is also called an annual well check. Dental exams once or twice a year. Routine eye exams. Ask your health care provider how often you should have your eyes checked. Personal lifestyle choices, including: Daily care of your teeth and gums. Regular physical activity. Eating a healthy diet. Avoiding tobacco and drug use. Limiting alcohol use. Practicing safe sex. Taking low-dose aspirin every day. Taking vitamin and mineral supplements as recommended by your health  care provider. What happens during an annual well check? The services and screenings done by your health care provider during your annual well check will depend on your age, overall health, lifestyle risk factors, and family history of disease. Counseling  Your health care provider may ask you questions about your: Alcohol use. Tobacco use. Drug use. Emotional well-being. Home and relationship well-being. Sexual activity. Eating habits. History of falls. Memory and ability to understand (cognition). Work and work Astronomer. Reproductive health. Screening  You may have the following tests or measurements: Height, weight, and BMI. Blood pressure. Lipid and cholesterol levels. These may be checked every 5 years, or more frequently if you are over 44 years old. Skin check. Lung cancer screening. You may have this screening every year starting at age 71 if you have a 30-pack-year history of smoking and currently smoke or have quit within the past 15 years. Fecal occult blood test (FOBT) of the stool. You may have this test every year starting at age 56. Flexible sigmoidoscopy or colonoscopy. You may have a sigmoidoscopy every 5 years or a colonoscopy every 10 years starting at age 80. Hepatitis C blood test. Hepatitis B blood test. Sexually transmitted disease (STD) testing. Diabetes screening. This is done by checking your blood sugar (glucose) after you have not eaten for a while (fasting). You may have this done every 1-3 years. Bone density scan. This is done to screen for osteoporosis. You may have this done starting at age 67. Mammogram. This may be done every 1-2 years. Talk to your health care provider about how often you should have regular mammograms. Talk with your health care provider about your test results, treatment options, and if necessary, the need for more tests. Vaccines  Your health care provider may recommend certain vaccines, such as: Influenza vaccine. This is  recommended every year. Tetanus, diphtheria, and acellular pertussis (Tdap, Td) vaccine. You may need a Td booster every 10 years. Zoster vaccine. You may need this after age 22. Pneumococcal 13-valent conjugate (PCV13) vaccine. One dose is recommended after age 93. Pneumococcal polysaccharide (PPSV23) vaccine. One dose is recommended after age 41. Talk to your health care provider about which screenings and vaccines you need and how often you need them. This information is not intended to replace advice given to you by your health care provider. Make sure you discuss any questions you have with your health care provider. Document Released: 10/19/2015 Document Revised: 06/11/2016 Document Reviewed: 07/24/2015 Elsevier Interactive Patient Education  2017 [place] Prevention in the Home Falls can cause injuries. They can happen to people of all ages. There are many things you can do to make your home safe and to help prevent falls. What can I do on the outside of my home? Regularly fix the edges of walkways and driveways and fix any cracks. Remove anything that might make you trip as you walk through a door, such as a raised step or threshold. Trim any bushes or trees on the path to your home. Use bright outdoor lighting. Clear any walking paths of anything that might make someone trip, such as rocks or tools. Regularly check to see if handrails are loose or broken. Make sure that both sides of any steps have handrails. Any raised decks and porches should have guardrails on the edges. Have any leaves, snow, or ice cleared regularly. Use sand or salt on walking paths during winter. Clean up any spills in your garage right away. This includes oil or grease spills. What can I do in the bathroom? Use night lights. Install grab bars by the toilet and in the tub and shower. Do not use towel bars as grab bars. Use non-skid mats or decals in the tub or shower. If you need to sit down in  the shower, use a plastic, non-slip stool. Keep the floor dry. Clean up any water that spills on the floor as soon as it happens. Remove soap buildup in the tub or shower regularly. Attach bath mats securely with double-sided non-slip rug tape. Do not have throw rugs and other things on the floor that can make you trip. What can I do in the bedroom? Use night lights. Make sure that you have a light by your bed that is easy to reach. Do not use any sheets or blankets that are too big for your bed. They should not hang down onto the floor. Have a firm chair that has side arms. You can use this for support while you get dressed. Do not have throw rugs and other things on the floor that can make you trip. What can I do in the kitchen? Clean up any spills right away. Avoid walking on wet floors. Keep items that you use a lot in easy-to-reach places. If you need to reach something above you, use a strong step stool that has a grab bar. Keep electrical cords out of the way. Do not use floor polish or wax that makes floors slippery. If you must use wax, use non-skid floor wax. Do not have throw rugs and other things on the floor that can make you trip. What can I do with my stairs? Do not leave any items on the stairs. Make sure that there are  handrails on both sides of the stairs and use them. Fix handrails that are broken or loose. Make sure that handrails are as long as the stairways. Check any carpeting to make sure that it is firmly attached to the stairs. Fix any carpet that is loose or worn. Avoid having throw rugs at the top or bottom of the stairs. If you do have throw rugs, attach them to the floor with carpet tape. Make sure that you have a light switch at the top of the stairs and the bottom of the stairs. If you do not have them, ask someone to add them for you. What else can I do to help prevent falls? Wear shoes that: Do not have high heels. Have rubber bottoms. Are comfortable  and fit you well. Are closed at the toe. Do not wear sandals. If you use a stepladder: Make sure that it is fully opened. Do not climb a closed stepladder. Make sure that both sides of the stepladder are locked into place. Ask someone to hold it for you, if possible. Clearly mark and make sure that you can see: Any grab bars or handrails. First and last steps. Where the edge of each step is. Use tools that help you move around (mobility aids) if they are needed. These include: Canes. Walkers. Scooters. Crutches. Turn on the lights when you go into a dark area. Replace any light bulbs as soon as they burn out. Set up your furniture so you have a clear path. Avoid moving your furniture around. If any of your floors are uneven, fix them. If there are any pets around you, be aware of where they are. Review your medicines with your doctor. Some medicines can make you feel dizzy. This can increase your chance of falling. Ask your doctor what other things that you can do to help prevent falls. This information is not intended to replace advice given to you by your health care provider. Make sure you discuss any questions you have with your health care provider. Document Released: 07/19/2009 Document Revised: 02/28/2016 Document Reviewed: 10/27/2014 Elsevier Interactive Patient Education  2017 [employer].

## 2021-05-28 NOTE — Progress Notes (Signed)
Subjective:   Kristine Lowe is a 65 y.o. female who presents for Medicare Annual (Subsequent) preventive examination.  Review of Systems     Cardiac Risk Factors include: advanced age (>3755men, 29>65 women);dyslipidemia;hypertension;smoking/ tobacco exposure     Objective:    Today's Vitals   05/28/21 1042  BP: 118/76  Pulse: 90  Resp: 16  Temp: 98.1 F (36.7 C)  TempSrc: Oral  SpO2: 97%  Weight: 129 lb 4.8 oz (58.7 kg)  Height: 5' (1.524 m)   Body mass index is 25.25 kg/m.  Advanced Directives 05/28/2021 05/24/2020 05/11/2019 02/11/2017 02/15/2016 10/11/2015  Does Patient Have a Medical Advance Directive? No No No No No No  Would patient like information on creating a medical advance directive? Yes (MAU/Ambulatory/Procedural Areas - Information given) No - Patient declined No - Patient declined No - Patient declined No - patient declined information No - patient declined information    Current Medications (verified) Outpatient Encounter Medications as of 05/28/2021  Medication Sig   albuterol (VENTOLIN HFA) 108 (90 Base) MCG/ACT inhaler INHALE 2 PUFFS BY MOUTH EVERY 6 HOURS AS NEEDED FOR WHEEZING OR SHORTNESS OF BREATH   azelastine (ASTELIN) 0.1 % nasal spray Place 2 sprays into both nostrils 2 (two) times daily. Use in each nostril as directed   busPIRone (BUSPAR) 5 MG tablet Take 1 tablet (5 mg total) by mouth 3 (three) times daily. Most of 3 times daily   Cholecalciferol (VITAMIN D) 50 MCG (2000 UT) CAPS Take 1 capsule (2,000 Units total) by mouth daily.   EQ ALLERGY RELIEF 10 MG tablet Take 1 tablet by mouth once daily   fluticasone (FLONASE) 50 MCG/ACT nasal spray Use 2 spray(s) in each nostril once daily   losartan (COZAAR) 50 MG tablet Take 1 tablet by mouth once daily   montelukast (SINGULAIR) 10 MG tablet TAKE 1 TABLET BY MOUTH AT BEDTIME   rosuvastatin (CRESTOR) 10 MG tablet Take 1 tablet by mouth once daily   cyclobenzaprine (FLEXERIL) 5 MG tablet Take 1 tablet  (5 mg total) by mouth 3 (three) times daily as needed for muscle spasms. (Patient not taking: Reported on 05/28/2021)   meclizine (ANTIVERT) 12.5 MG tablet Take 1 tablet (12.5 mg total) by mouth 3 (three) times daily as needed for dizziness. (Patient not taking: Reported on 05/28/2021)   [DISCONTINUED] valACYclovir (VALTREX) 1000 MG tablet TAKE 1 TABLET BY MOUTH THREE TIMES DAILY FOR 7 DAYS   No facility-administered encounter medications on file as of 05/28/2021.    Allergies (verified) Penicillins, Simvastatin, and Zoloft [sertraline hcl]   History: Past Medical History:  Diagnosis Date   Allergy    Anxiety    Chronic obstructive airway disease (HCC)    Dizziness and giddiness    Esophageal reflux    Hypertension    Insomnia    Lumbago    Lumbosacral neuritis    Pure hypercholesterolemia    Past Surgical History:  Procedure Laterality Date   BREAST EXCISIONAL BIOPSY Left 1996   neg   Family History  Problem Relation Age of Onset   Breast cancer Mother [id number]   Alzheimer's disease Mother    Kidney disease Mother    Breast cancer Maternal Aunt [id number]   Breast cancer Paternal Grandmother    Polycythemia Maternal Grandmother    Social History   Socioeconomic History   Marital status: Married    Spouse name: Not on file   Number of children: 7   Years of education: Not on file  Highest education level: Some college, no degree  Occupational History   Occupation: member Teaching laboratory technician   Tobacco Use   Smoking status: Some Days    Packs/day: 0.25    Years: 38.00    Pack years: 9.50    Types: E-cigarettes, Cigarettes   Smokeless tobacco: Never  Vaping Use   Vaping Use: Some days  Substance and Sexual Activity   Alcohol use: No    Alcohol/week: 0.0 standard drinks   Drug use: No   Sexual activity: Not Currently  Other Topics Concern   Not on file  Social History Narrative   Married , has 7 grown children, working part time   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) Strain: Low Risk    Difficulty of Paying Living Expenses: Not hard at all  Food Insecurity: No Food Insecurity   Worried About Programme researcher, broadcasting/film/video in the Last Year: Never true   Barista in the Last Year: Never true  Transportation Needs: No Transportation Needs   Lack of Transportation (Medical): No   Lack of Transportation (Non-Medical): No  Physical Activity: Inactive   Days of Exercise per Week: 0 days   Minutes of Exercise per Session: 0 min  Stress: No Stress Concern Present   Feeling of Stress : Not at all  Social Connections: Socially Integrated   Frequency of Communication with Friends and Family: More than three times a week   Frequency of Social Gatherings with Friends and Family: Once a week   Attends Religious Services: More than 4 times per year   Active Member of [employer] or Organizations: Yes   Attends Engineer, structural: More than 4 times per year   Marital Status: Married    Tobacco Counseling Ready to quit: No Counseling given: No   Clinical Intake:  Pre-visit preparation completed: Yes  Pain : No/denies pain     BMI - recorded: 25.25 Nutritional Status: BMI of 19-24  Normal Nutritional Risks: None Diabetes: No  How often do you need to have someone help you when you read instructions, pamphlets, or other written materials from your doctor or pharmacy?: 1 - Never   Interpreter Needed?: No  Information entered by :: Reather Littler LPN   Activities of Daily Living In your present state of health, do you have any difficulty performing the following activities: 05/28/2021 04/02/2021  Hearing? N N  Vision? N N  Difficulty concentrating or making decisions? N N  Walking or climbing stairs? N N  Dressing or bathing? N N  Doing errands, shopping? N N  Preparing Food and eating ? N -  Using the Toilet? N -  In the past six months, have you accidently leaked urine? Y -  Do you have problems with loss of bowel control? N -   Managing your Medications? N -  Managing your Finances? N -  Housekeeping or managing your Housekeeping? N -  Some recent data might be hidden    Patient Care Team: Alba Cory, MD as PCP - General (Family Medicine)  Indicate any recent Medical Services you may have received from other than [facility] providers in the past year (date may be approximate).     Assessment:   This is a routine wellness examination for [place].  Hearing/Vision screen Hearing Screening - Comments:: Pt denies hearing difficulty Vision Screening - Comments:: Annual vision screenings with Dr. Santiago Bumpers  Dietary issues and exercise activities discussed: Current Exercise Habits: The patient does not participate  in regular exercise at present, Exercise limited by: None identified   Goals Addressed             This Visit's Progress    Increase physical activity       Recommend increasing physical activity to at least 3 times per week for 30 minutes        Depression Screen PHQ 2/9 Scores 05/28/2021 04/02/2021 02/14/2021 12/11/2020 09/04/2020 06/26/2020 05/24/2020  PHQ - 2 Score 0 0 0 0 0 0 0  PHQ- 9 Score - - - - 0 0 -    Fall Risk Fall Risk  05/28/2021 04/02/2021 02/14/2021 12/11/2020 09/04/2020  Falls in the past year? 0 0 0 0 0  Number falls in past yr: 0 0 0 0 0  Injury with Fall? 0 0 0 0 0  Risk for fall due to : No Fall Risks - - - -  Follow up Falls prevention discussed - Falls prevention discussed - -    FALL RISK PREVENTION PERTAINING TO THE HOME:  Any stairs in or around the home? Yes  If so, are there any without handrails? No  Home free of loose throw rugs in walkways, pet beds, electrical cords, etc? Yes  Adequate lighting in your home to reduce risk of falls? Yes   ASSISTIVE DEVICES UTILIZED TO PREVENT FALLS:  Life alert? No  Use of a cane, walker or w/c? No  Grab bars in the bathroom? Yes  Shower chair or bench in shower? No  Elevated toilet seat or a handicapped toilet? No    TIMED UP AND GO:  Was the test performed? Yes .  Length of time to ambulate 10 feet: 5 sec.   Gait steady and fast without use of assistive device  Cognitive Function: Normal cognitive status assessed by direct observation by this Nurse Health Advisor. No abnormalities found.          Immunizations Immunization History  Administered Date(s) Administered   Fluad Quad(high Dose 65+) 07/26/2019, 06/26/2020   Moderna Sars-Covid-2 Vaccination 11/17/2019, 12/15/2019, 09/15/2020   Pneumococcal Conjugate-13 04/26/2019   Pneumococcal Polysaccharide-23 05/24/2020    TDAP status: Due, Education has been provided regarding the importance of this vaccine. Advised may receive this vaccine at local pharmacy or Health Dept. Aware to provide a copy of the vaccination record if obtained from local pharmacy or Health Dept. Verbalized acceptance and understanding.  Flu Vaccine status: Up to date  Pneumococcal vaccine status: Up to date  Covid-19 vaccine status: Completed vaccines  Qualifies for Shingles Vaccine? Yes   Zostavax completed No   Shingrix Completed?: No.    Education has been provided regarding the importance of this vaccine. Patient has been advised to call insurance company to determine out of pocket expense if they have not yet received this vaccine. Advised may also receive vaccine at local pharmacy or Health Dept. Verbalized acceptance and understanding.  Screening Tests Health Maintenance  Topic Date Due   COLONOSCOPY (Pts 45-14yrs Insurance coverage will need to be confirmed)  Never done   COVID-19 Vaccine (4 - Booster for Moderna series) 12/14/2020   INFLUENZA VACCINE  05/06/2021   TETANUS/TDAP  09/03/2021 (Originally 02/05/1975)   Zoster Vaccines- Shingrix (1 of 2) 10/06/2021 (Originally 02/05/1975)   MAMMOGRAM  09/20/2021   DEXA SCAN  Completed   Hepatitis C Screening  Completed   PNA vac Low Risk Adult  Completed   HPV VACCINES  Aged Out    Health  Maintenance  Health Maintenance Due  Topic Date Due   COLONOSCOPY (Pts 45-65yrs Insurance coverage will need to be confirmed)  Never done   COVID-19 Vaccine (4 - Booster for Moderna series) 12/14/2020   INFLUENZA VACCINE  05/06/2021    Colorectal cancer screening: patient declines colonoscopy referral  Mammogram status: Completed 09/20/20. Repeat every year. Ordered today  Bone Density status: Completed 09/20/19. Results reflect: Bone density results: OSTEOPENIA. Repeat every 2 years. Ordered today.   Lung Cancer Screening: (Low Dose CT Chest recommended if Age 63-80 years, 30 pack-year currently smoking OR have quit w/in 15years.) does not qualify.   Additional Screening:  Hepatitis C Screening: does qualify; Completed 04/22/19  Vision Screening: Recommended annual ophthalmology exams for early detection of glaucoma and other disorders of the eye. Is the patient up to date with their annual eye exam?  Yes  Who is the provider or what is the name of the office in which the patient attends annual eye exams? [facility].   Dental Screening: Recommended annual dental exams for proper oral hygiene  Community Resource Referral / Chronic Care Management: CRR required this visit?  No   CCM required this visit?  No      Plan:     I have personally reviewed and noted the following in the patient's chart:   Medical and social history Use of alcohol, tobacco or illicit drugs  Current medications and supplements including opioid prescriptions.  Functional ability and status Nutritional status Physical activity Advanced directives List of other physicians Hospitalizations, surgeries, and ER visits in previous 12 months Vitals Screenings to include cognitive, depression, and falls Referrals and appointments  In addition, I have reviewed and discussed with patient certain preventive protocols, quality metrics, and best practice recommendations. A written personalized care  plan for preventive services as well as general preventive health recommendations were provided to patient.     Reather Littler, LPN   10/08/7251   Nurse Notes: pt states she needs a refill of cyclobenzaprine 5 mg sent to [facility] [address]. Pt states she uses sparingly as needed.

## 2021-06-24 DIAGNOSIS — M5134 Other intervertebral disc degeneration, thoracic region: Secondary | ICD-10-CM | POA: Diagnosis not present

## 2021-06-24 DIAGNOSIS — R519 Headache, unspecified: Secondary | ICD-10-CM | POA: Diagnosis not present

## 2021-06-24 DIAGNOSIS — M9901 Segmental and somatic dysfunction of cervical region: Secondary | ICD-10-CM | POA: Diagnosis not present

## 2021-06-24 DIAGNOSIS — M9902 Segmental and somatic dysfunction of thoracic region: Secondary | ICD-10-CM | POA: Diagnosis not present

## 2021-06-28 ENCOUNTER — Encounter: Payer: Self-pay | Admitting: Family Medicine

## 2021-06-28 ENCOUNTER — Ambulatory Visit (INDEPENDENT_AMBULATORY_CARE_PROVIDER_SITE_OTHER): Payer: Medicare HMO | Admitting: Family Medicine

## 2021-06-28 ENCOUNTER — Other Ambulatory Visit: Payer: Self-pay

## 2021-06-28 VITALS — BP 122/74 | HR 102 | Temp 98.1°F | Resp 16 | Ht 60.0 in | Wt 128.0 lb

## 2021-06-28 DIAGNOSIS — R3121 Asymptomatic microscopic hematuria: Secondary | ICD-10-CM | POA: Diagnosis not present

## 2021-06-28 DIAGNOSIS — E559 Vitamin D deficiency, unspecified: Secondary | ICD-10-CM | POA: Diagnosis not present

## 2021-06-28 DIAGNOSIS — Z79899 Other long term (current) drug therapy: Secondary | ICD-10-CM

## 2021-06-28 DIAGNOSIS — I10 Essential (primary) hypertension: Secondary | ICD-10-CM

## 2021-06-28 DIAGNOSIS — E785 Hyperlipidemia, unspecified: Secondary | ICD-10-CM | POA: Diagnosis not present

## 2021-06-28 DIAGNOSIS — J410 Simple chronic bronchitis: Secondary | ICD-10-CM | POA: Diagnosis not present

## 2021-06-28 DIAGNOSIS — Z23 Encounter for immunization: Secondary | ICD-10-CM

## 2021-06-28 DIAGNOSIS — R739 Hyperglycemia, unspecified: Secondary | ICD-10-CM | POA: Diagnosis not present

## 2021-06-28 DIAGNOSIS — Z0000 Encounter for general adult medical examination without abnormal findings: Secondary | ICD-10-CM

## 2021-06-28 DIAGNOSIS — Z8632 Personal history of gestational diabetes: Secondary | ICD-10-CM | POA: Diagnosis not present

## 2021-06-28 DIAGNOSIS — F411 Generalized anxiety disorder: Secondary | ICD-10-CM | POA: Diagnosis not present

## 2021-06-28 NOTE — Progress Notes (Signed)
Name: Kristine Lowe   MRN: [id number]    DOB: 1956-07-12   Date:06/28/2021       Progress Note  Subjective  Chief Complaint  Annual Exam  HPI  Patient presents for annual CPE and follow up. She is aware of possible additional cost   Hematuria: found on urine dip, negative culture , last urine microscopy also positive , previous history of kidney stones found on CT done in 2006, she went back to Urologist 02/2021 , she has small left kidney stone and was advised follow up prn   Anxiety: long history and has been doing well on Buspar, she is taking one every morning but the evening dose mostly prn.  she tried SSRI but causes her side effects. She states it keeps her calm    Hyperglycemia: A1C has been stable at 5.7 %, she likes sweets, we will recheck levels. Denies polyphagia, polydipsia or polyuria  HTN: bp is at goal, no chest pain or palpitation. Denies SOB with activity or dizziness   Dyslipidemia: Last LDL was very high still at 195 , it was down to 97 when she was taking Crestor, she states she has been compliant with medication again   Vitamin D deficiency: continue otc supplementation   Chronic bronchitis: she states gets worse this time of the year when pollen count is high. She quit smoking, but still vaping. She denies a cough, no wheezing or sob at this time   Headaches:she states since seeing chiropractor once a moth headaches have resolved   Bradycardia: she has a new apple watch and at night she has some heart rate variability and bradycardia - HR 40-50's. She has a history of sleep apnea but never treated. She denies palpitation, chest pain, dizziness or decrease in exercise tolerance. Explained the heart rate variability may be from OSA, but less than 12 ms she states no problems lately    Diet: not very balanced  Exercise: discussed importance of regular physical activity    Flowsheet Row Clinical Support from 05/28/2021 in [facility]   AUDIT-C Score 0      Depression: Phq 9 is  negative Depression screen Kristine Lowe 2/9 06/28/2021 05/28/2021 04/02/2021 02/14/2021 12/11/2020  Decreased Interest 0 0 0 0 0  Down, Depressed, Hopeless 0 0 0 0 0  PHQ - 2 Score 0 0 0 0 0  Altered sleeping - - - - -  Tired, decreased energy - - - - -  Change in appetite - - - - -  Feeling bad or failure about yourself  - - - - -  Trouble concentrating - - - - -  Moving slowly or fidgety/restless - - - - -  Suicidal thoughts - - - - -  PHQ-9 Score - - - - -  Difficult doing work/chores - - - - -   Hypertension: BP Readings from Last 3 Encounters:  06/28/21 122/74  05/28/21 118/76  04/02/21 120/72   Obesity: Wt Readings from Last 3 Encounters:  06/28/21 128 lb (58.1 kg)  05/28/21 129 lb 4.8 oz (58.7 kg)  04/02/21 132 lb (59.9 kg)   BMI Readings from Last 3 Encounters:  06/28/21 25.00 kg/m  05/28/21 25.25 kg/m  04/02/21 25.78 kg/m     Vaccines:  HPV: up to at age 28 , ask insurance if age between 30-45  Shingrix: 65-64 yo and ask insurance if covered when patient above 109 yo Pneumonia: educated and discussed with patient. Flu: educated and discussed with patient.  Hep C Screening: 04/22/19 STD testing and prevention (HIV/chl/gon/syphilis): N/A Intimate partner violence: negative Sexual History : no pain during sex, denies pos coital bleeding  Menstrual History/LMP/Abnormal Bleeding: discussed postmenopausal bleeding.  Incontinence Symptoms: nocturia, intermittent symptoms during the day when she takes too long to go to the bathroom   Breast cancer:  - Last Mammogram: ordered 05/28/21 - BRCA gene screening: there is family history of breast  cancer ( mother, aunt and also a female breast cancer on father's side) , discussed genetic testing , she is not sure about it at this time   Osteoporosis: Discussed high calcium and vitamin D supplementation, weight bearing exercises  Cervical cancer screening: N/A  Skin cancer: Discussed  monitoring for atypical lesions  Colorectal cancer: N/A   Lung cancer: she is not interested  ECG: 04/22/19  Advanced Care Planning: A voluntary discussion about advance care planning including the explanation and discussion of advance directives.  Discussed health care proxy and Living will, and the patient was able to identify a health care proxy as husband   Lipids: Lab Results  Component Value Date   CHOL 290 (H) 12/11/2020   CHOL 172 02/29/2020   CHOL 176 07/26/2019   Lab Results  Component Value Date   HDL 50 12/11/2020   HDL 56 02/29/2020   HDL 51 07/26/2019   Lab Results  Component Value Date   LDLCALC 195 (H) 12/11/2020   LDLCALC 97 02/29/2020   LDLCALC 103 (H) 07/26/2019   Lab Results  Component Value Date   TRIG 250 (H) 12/11/2020   TRIG 97 02/29/2020   TRIG 119 07/26/2019   Lab Results  Component Value Date   CHOLHDL 5.8 (H) 12/11/2020   CHOLHDL 3.1 02/29/2020   CHOLHDL 3.5 07/26/2019   No results found for: LDLDIRECT  Glucose: Glucose  Date Value Ref Range Status  02/20/2014 85 65 - 99 mg/dL Final  10/18/2011 94 65 - 99 mg/dL Final   Glucose, Bld  Date Value Ref Range Status  12/11/2020 96 65 - 99 mg/dL Final    Comment:    .            Fasting reference interval .   02/29/2020 93 65 - 99 mg/dL Final    Comment:    .            Fasting reference interval .   07/26/2019 88 65 - 99 mg/dL Final    Comment:    .            Fasting reference interval .     Patient Active Problem List   Diagnosis Date Noted   Leg pain 04/08/2020   Dyslipidemia 06/29/2019   History of gestational diabetes 02/13/2017   Avitaminosis D 02/15/2016   Obstructive apnea 02/15/2016   Blood glucose elevated 02/15/2016   Abnormal LFTs 02/15/2016   Dermatitis, eczematoid 02/15/2016   Generalized anxiety disorder 10/11/2015   Simple chronic bronchitis ([name]) 10/11/2015   Chronic back pain 10/11/2015   Chronic constipation 10/11/2015   Reflux esophagitis  05/01/2007    Past Surgical History:  Procedure Laterality Date   BREAST EXCISIONAL BIOPSY Left 1996   neg    Family History  Problem Relation Age of Onset   Breast cancer Mother 76   Alzheimer's disease Mother    Kidney disease Mother    Breast cancer Maternal Aunt 60   Breast cancer Paternal Grandmother    Polycythemia Maternal Grandmother     Social History  Socioeconomic History   Marital status: Married    Spouse name: Not on file   Number of children: 7   Years of education: Not on file   Highest education level: Some college, no degree  Occupational History   Occupation: member Administrator, arts   Tobacco Use   Smoking status: Some Days    Packs/day: 0.25    Years: 38.00    Pack years: 9.50    Types: E-cigarettes, Cigarettes   Smokeless tobacco: Never  Vaping Use   Vaping Use: Some days  Substance and Sexual Activity   Alcohol use: No    Alcohol/week: 0.0 standard drinks   Drug use: No   Sexual activity: Not Currently  Other Topics Concern   Not on file  Social History Narrative   Married , has 7 grown children, working part time   Investment banker, operational of Radio broadcast assistant Strain: Low Risk    Difficulty of Paying Living Expenses: Not hard at all  Food Insecurity: No Food Insecurity   Worried About Charity fundraiser in the Last Year: Never true   Arboriculturist in the Last Year: Never true  Transportation Needs: No Transportation Needs   Lack of Transportation (Medical): No   Lack of Transportation (Non-Medical): No  Physical Activity: Inactive   Days of Exercise per Week: 0 days   Minutes of Exercise per Session: 0 min  Stress: No Stress Concern Present   Feeling of Stress : Only a little  Social Connections: Engineer, building services of Communication with Friends and Family: More than three times a week   Frequency of Social Gatherings with Friends and Family: Three times a week   Attends Religious Services: More than 4 times per  year   Active Member of Clubs or Organizations: Yes   Attends Music therapist: More than 4 times per year   Marital Status: Married  Human resources officer Violence: Not At Risk   Fear of Current or Ex-Partner: No   Emotionally Abused: No   Physically Abused: No   Sexually Abused: No     Current Outpatient Medications:    albuterol (VENTOLIN HFA) 108 (90 Base) MCG/ACT inhaler, INHALE 2 PUFFS BY MOUTH EVERY 6 HOURS AS NEEDED FOR WHEEZING OR SHORTNESS OF BREATH, Disp: 18 g, Rfl: 1   azelastine (ASTELIN) 0.1 % nasal spray, Place 2 sprays into both nostrils 2 (two) times daily. Use in each nostril as directed, Disp: 30 mL, Rfl: 1   busPIRone (BUSPAR) 5 MG tablet, Take 1 tablet (5 mg total) by mouth 3 (three) times daily. Most of 3 times daily, Disp: 90 tablet, Rfl: 5   Cholecalciferol (VITAMIN D) 50 MCG (2000 UT) CAPS, Take 1 capsule (2,000 Units total) by mouth daily., Disp: 30 capsule, Rfl: 0   cyclobenzaprine (FLEXERIL) 5 MG tablet, Take 1 tablet (5 mg total) by mouth 3 (three) times daily as needed for muscle spasms., Disp: 90 tablet, Rfl: 0   EQ ALLERGY RELIEF 10 MG tablet, Take 1 tablet by mouth once daily, Disp: 90 tablet, Rfl: 0   fluticasone (FLONASE) 50 MCG/ACT nasal spray, Use 2 spray(s) in each nostril once daily, Disp: 16 g, Rfl: 0   losartan (COZAAR) 50 MG tablet, Take 1 tablet by mouth once daily, Disp: 90 tablet, Rfl: 0   meclizine (ANTIVERT) 12.5 MG tablet, Take 1 tablet (12.5 mg total) by mouth 3 (three) times daily as needed for dizziness., Disp: 30 tablet, Rfl: 0  montelukast (SINGULAIR) 10 MG tablet, TAKE 1 TABLET BY MOUTH AT BEDTIME, Disp: 90 tablet, Rfl: 0   rosuvastatin (CRESTOR) 10 MG tablet, Take 1 tablet by mouth once daily, Disp: 90 tablet, Rfl: 0  Allergies  Allergen Reactions   Penicillins Swelling   Simvastatin    Zoloft [Sertraline Hcl] Other (See Comments)    Shop lift     ROS  Constitutional: Negative for fever or weight change.   Respiratory: Negative for cough and shortness of breath.   Cardiovascular: Negative for chest pain or palpitations.  Gastrointestinal: Negative for abdominal pain, no bowel changes.  Musculoskeletal: Negative for gait problem or joint swelling.  Skin: Negative for rash.  Neurological: Negative for dizziness or headache.  No other specific complaints in a complete review of systems (except as listed in HPI above).   Objective  Vitals:   06/28/21 1049  BP: 122/74  Pulse: (!) 102  Resp: 16  Temp: 98.1 F (36.7 C)  SpO2: 95%  Weight: 128 lb (58.1 kg)  Height: 5' (1.524 m)    Body mass index is 25 kg/m.  Physical Exam  Constitutional: Patient appears well-developed and well-nourished. No distress.  HENT: Head: Normocephalic and atraumatic. Ears: B TMs ok, no erythema or effusion; Nose: Nose normal. Mouth/Throat: not done  Eyes: Conjunctivae and EOM are normal. Pupils are equal, round, and reactive to light. No scleral icterus.  Neck: Normal range of motion. Neck supple. No JVD present. No thyromegaly present.  Cardiovascular: Normal rate, regular rhythm and normal heart sounds.  No murmur heard. No BLE edema. Pulmonary/Chest: Effort normal , scattered rhonchi No respiratory distress. Abdominal: Soft. Bowel sounds are normal, no distension. There is no tenderness. no masses Breast: no lumps or masses, no nipple discharge or rashes FEMALE GENITALIA:  Not done  RECTAL: not done  Musculoskeletal: Normal range of motion, no joint effusions. No gross deformities Neurological: he is alert and oriented to person, place, and time. No cranial nerve deficit. Coordination, balance, strength, speech and gait are normal.  Skin: Skin is warm and dry. No rash noted. No erythema.  Psychiatric: Patient has a normal mood and affect. behavior is normal. Judgment and thought content normal.    Fall Risk: Fall Risk  06/28/2021 05/28/2021 04/02/2021 02/14/2021 12/11/2020  Falls in the past year? 0 0 0  0 0  Number falls in past yr: 0 0 0 0 0  Injury with Fall? 0 0 0 0 0  Risk for fall due to : No Fall Risks No Fall Risks - - -  Follow up Falls prevention discussed Falls prevention discussed - Falls prevention discussed -     Functional Status Survey: Is the patient deaf or have difficulty hearing?: No Does the patient have difficulty seeing, even when wearing glasses/contacts?: No Does the patient have difficulty concentrating, remembering, or making decisions?: No Does the patient have difficulty walking or climbing stairs?: No Does the patient have difficulty dressing or bathing?: No Does the patient have difficulty doing errands alone such as visiting a doctor's office or shopping?: No   Assessment & Plan  1. Well adult exam   2. Need for immunization against influenza  - Flu Vaccine QUAD High Dose(Fluad)  3. Simple chronic bronchitis ([place])   4. Vitamin D deficiency  - VITAMIN D 25 Hydroxy (Vit-D Deficiency, Fractures)  5. Dyslipidemia  - Lipid panel  6. GAD (generalized anxiety disorder)   7. Hypertension, benign  - CBC with Differential/Platelet - COMPLETE METABOLIC PANEL WITH GFR  8. Asymptomatic microscopic hematuria   9. Long-term use of high-risk medication   10. Hyperglycemia  - Hemoglobin A1c  11. Personal history of gestational diabetes  - Hemoglobin A1c    -USPSTF grade A and B recommendations reviewed with patient; age-appropriate recommendations, preventive care, screening tests, etc discussed and encouraged; healthy living encouraged; see AVS for patient education given to patient -Discussed importance of 150 minutes of physical activity weekly, eat two servings of fish weekly, eat one serving of tree nuts ( cashews, pistachios, pecans, almonds.Marland Kitchen) every other day, eat 6 servings of fruit/vegetables daily and drink plenty of water and avoid sweet beverages.

## 2021-06-28 NOTE — Patient Instructions (Signed)
Preventive Care 40 Years and Older, Female Preventive care refers to lifestyle choices and visits with your health care provider that can promote health and wellness. This includes: A yearly physical exam. This is also called an annual wellness visit. Regular dental and eye exams. Immunizations. Screening for certain conditions. Healthy lifestyle choices, such as: Eating a healthy diet. Getting regular exercise. Not using drugs or products that contain nicotine and tobacco. Limiting alcohol use. What can I expect for my preventive care visit? Physical exam Your health care provider will check your: Height and weight. These may be used to calculate your BMI (body mass index). BMI is a measurement that tells if you are at a healthy weight. Heart rate and blood pressure. Body temperature. Skin for abnormal spots. Counseling Your health care provider may ask you questions about your: Past medical problems. Family's medical history. Alcohol, tobacco, and drug use. Emotional well-being. Home life and relationship well-being. Sexual activity. Diet, exercise, and sleep habits. History of falls. Memory and ability to understand (cognition). Work and work Statistician. Pregnancy and menstrual history. Access to firearms. What immunizations do I need? Vaccines are usually given at various ages, according to a schedule. Your health care provider will recommend vaccines for you based on your age, medical history, and lifestyle or other factors, such as travel or where you work. What tests do I need? Blood tests Lipid and cholesterol levels. These may be checked every 5 years, or more often depending on your overall health. Hepatitis C test. Hepatitis B test. Screening Lung cancer screening. You may have this screening every year starting at age 67 if you have a 30-pack-year history of smoking and currently smoke or have quit within the past 15 years. Colorectal cancer screening. All  adults should have this screening starting at age 67 and continuing until age 9. Your health care provider may recommend screening at age 67 if you are at increased risk. You will have tests every 1-10 years, depending on your results and the type of screening test. Diabetes screening. This is done by checking your blood sugar (glucose) after you have not eaten for a while (fasting). You may have this done every 1-3 years. Mammogram. This may be done every 1-2 years. Talk with your health care provider about how often you should have regular mammograms. Abdominal aortic aneurysm (AAA) screening. You may need this if you are a current or former smoker. BRCA-related cancer screening. This may be done if you have a family history of breast, ovarian, tubal, or peritoneal cancers. Other tests STD (sexually transmitted disease) testing, if you are at risk. Bone density scan. This is done to screen for osteoporosis. You may have this done starting at age 67. Talk with your health care provider about your test results, treatment options, and if necessary, the need for more tests. Follow these instructions at home: Eating and drinking  Eat a diet that includes fresh fruits and vegetables, whole grains, lean protein, and low-fat dairy products. Limit your intake of foods with high amounts of sugar, saturated fats, and salt. Take vitamin and mineral supplements as recommended by your health care provider. Do not drink alcohol if your health care provider tells you not to drink. If you drink alcohol: Limit how much you have to 0-1 drink a day. Be aware of how much alcohol is in your drink. In the [place] one drink equals one 12 oz bottle of beer (355 mL), one 5 oz glass of wine (148 mL), or one  1 oz glass of hard liquor (44 mL). Lifestyle Take daily care of your teeth and gums. Brush your teeth every morning and night with fluoride toothpaste. Floss one time each day. Stay active. Exercise for at least  30 minutes 5 or more days each week. Do not use any products that contain nicotine or tobacco, such as cigarettes, e-cigarettes, and chewing tobacco. If you need help quitting, ask your health care provider. Do not use drugs. If you are sexually active, practice safe sex. Use a condom or other form of protection in order to prevent STIs (sexually transmitted infections). Talk with your health care provider about taking a low-dose aspirin or statin. Find healthy ways to cope with stress, such as: Meditation, yoga, or listening to music. Journaling. Talking to a trusted person. Spending time with friends and family. Safety Always wear your seat belt while driving or riding in a vehicle. Do not drive: If you have been drinking alcohol. Do not ride with someone who has been drinking. When you are tired or distracted. While texting. Wear a helmet and other protective equipment during sports activities. If you have firearms in your house, make sure you follow all gun safety procedures. What's next? Visit your health care provider once a year for an annual wellness visit. Ask your health care provider how often you should have your eyes and teeth checked. Stay up to date on all vaccines. This information is not intended to replace advice given to you by your health care provider. Make sure you discuss any questions you have with your health care provider. Document Revised: 11/30/2020 Document Reviewed: 09/16/2018 Elsevier Patient Education  2022 [employer].

## 2021-06-29 LAB — LIPID PANEL
Cholesterol: 163 mg/dL (ref <200)
HDL: 50 mg/dL (ref >=50)
LDL Cholesterol (Calc): 92 mg/dL (calc)
Non-HDL Cholesterol (Calc): 113 mg/dL (calc) (ref <130)
Total CHOL/HDL Ratio: 3.3 (calc) (ref <5.0)
Triglycerides: 118 mg/dL (ref <150)

## 2021-06-29 LAB — COMPLETE METABOLIC PANEL WITH GFR
AG Ratio: 1.5 (calc) (ref 1.0–2.5)
ALT: 10 U/L (ref 6–29)
AST: 14 U/L (ref 10–35)
Albumin: 4.1 g/dL (ref 3.6–5.1)
Alkaline phosphatase (APISO): 82 U/L (ref 37–153)
BUN: 15 mg/dL (ref 7–25)
CO2: 28 mmol/L (ref 20–32)
Calcium: 9.8 mg/dL (ref 8.6–10.4)
Chloride: 105 mmol/L (ref 98–110)
Creat: 0.58 mg/dL (ref 0.50–1.05)
Globulin: 2.8 g/dL (calc) (ref 1.9–3.7)
Glucose, Bld: 79 mg/dL (ref 65–99)
Potassium: 4.7 mmol/L (ref 3.5–5.3)
Sodium: 141 mmol/L (ref 135–146)
Total Bilirubin: 0.3 mg/dL (ref 0.2–1.2)
Total Protein: 6.9 g/dL (ref 6.1–8.1)
eGFR: 99 mL/min/{1.73_m2} (ref >=60)

## 2021-06-29 LAB — VITAMIN D 25 HYDROXY (VIT D DEFICIENCY, FRACTURES): Vit D, 25-Hydroxy: 56 ng/mL (ref 30–100)

## 2021-06-29 LAB — HEMOGLOBIN A1C
Hgb A1c MFr Bld: 5.9 % of total Hgb — ABNORMAL HIGH (ref <5.7)
Mean Plasma Glucose: 123 mg/dL
eAG (mmol/L): 6.8 mmol/L

## 2021-06-29 LAB — CBC WITH DIFFERENTIAL/PLATELET
Absolute Monocytes: 390 cells/uL (ref 200–950)
Basophils Absolute: 50 cells/uL (ref 0–200)
Basophils Relative: 1 %
Eosinophils Absolute: 70 cells/uL (ref 15–500)
Eosinophils Relative: 1.4 %
HCT: 43.3 % (ref 35.0–45.0)
Hemoglobin: 13.7 g/dL (ref 11.7–15.5)
Lymphs Abs: 1515 cells/uL (ref 850–3900)
MCH: 28 pg (ref 27.0–33.0)
MCHC: 31.6 g/dL — ABNORMAL LOW (ref 32.0–36.0)
MCV: 88.4 fL (ref 80.0–100.0)
MPV: 9.8 fL (ref 7.5–12.5)
Monocytes Relative: 7.8 %
Neutro Abs: 2975 cells/uL (ref 1500–7800)
Neutrophils Relative %: 59.5 %
Platelets: 302 10*3/uL (ref 140–400)
RBC: 4.9 10*6/uL (ref 3.80–5.10)
RDW: 13.6 % (ref 11.0–15.0)
Total Lymphocyte: 30.3 %
WBC: 5 10*3/uL (ref 3.8–10.8)

## 2021-07-12 ENCOUNTER — Other Ambulatory Visit: Payer: Self-pay | Admitting: Family Medicine

## 2021-07-12 DIAGNOSIS — F411 Generalized anxiety disorder: Secondary | ICD-10-CM

## 2021-09-04 ENCOUNTER — Other Ambulatory Visit: Payer: Self-pay | Admitting: Family Medicine

## 2021-09-04 DIAGNOSIS — I10 Essential (primary) hypertension: Secondary | ICD-10-CM

## 2021-09-04 DIAGNOSIS — E785 Hyperlipidemia, unspecified: Secondary | ICD-10-CM

## 2021-09-04 DIAGNOSIS — J301 Allergic rhinitis due to pollen: Secondary | ICD-10-CM

## 2021-09-05 DEATH — deceased

## 2021-09-23 ENCOUNTER — Other Ambulatory Visit: Payer: Self-pay

## 2021-09-23 ENCOUNTER — Ambulatory Visit
Admission: RE | Admit: 2021-09-23 | Discharge: 2021-09-23 | Disposition: A | Discharge status: Home / Self Care | Payer: Medicare HMO | Source: Ambulatory Visit | Attending: Family Medicine | Admitting: Family Medicine

## 2021-09-23 DIAGNOSIS — Z780 Asymptomatic menopausal state: Secondary | ICD-10-CM | POA: Diagnosis not present

## 2021-09-23 DIAGNOSIS — Z1231 Encounter for screening mammogram for malignant neoplasm of breast: Secondary | ICD-10-CM | POA: Diagnosis not present

## 2021-09-23 DIAGNOSIS — M85851 Other specified disorders of bone density and structure, right thigh: Secondary | ICD-10-CM | POA: Diagnosis not present

## 2021-12-03 ENCOUNTER — Other Ambulatory Visit: Payer: Self-pay | Admitting: Family Medicine

## 2021-12-03 DIAGNOSIS — I10 Essential (primary) hypertension: Secondary | ICD-10-CM

## 2021-12-03 DIAGNOSIS — J301 Allergic rhinitis due to pollen: Secondary | ICD-10-CM

## 2021-12-03 DIAGNOSIS — E785 Hyperlipidemia, unspecified: Secondary | ICD-10-CM

## 2021-12-27 ENCOUNTER — Ambulatory Visit: Payer: Medicare HMO | Admitting: Family Medicine

## 2022-01-02 NOTE — Progress Notes (Signed)
Name: Kristine Lowe   MRN: [id number]    DOB: [birth year]   Date:01/03/2022 ? ?     Progress Note ? ?Subjective ? ?Chief Complaint ? ?Follow Up ? ?HPI ? ?Hematuria: found on urine dip, negative culture , last urine microscopy also positive , previous history of kidney stones found on CT done in 2006, she went back to Urologist 02/2021 , she has small left kidney stone and was advised follow up prn Unchanged ? ?Anxiety: long history and has been doing well on Buspar TID but only responds to brand name, usually two to three times daily and needs a refill. Cannot tolerate SSRI's. She states it keeps her calm  ?  ?Pre-diabetes: A1C has been stable at 5.9 %, she gets sleepy after a high carb diet, starts at about 30 minutes after a meal and lasts for about 1.5 hours.  Denies polyphagia, polydipsia or polyuria. Discussed a low carb diet.  ? ?HTN: bp is at goal, no chest pain or palpitation. Denies SOB with activity or dizziness Unchanged  ? ?Dyslipidemia: Last LDL was very high still at 195 , it was down to 97 when she was taking Crestor, she states she has been compliant with medication again We will recheck it yearly  ? ?Vitamin D deficiency: continue otc supplementation Last level was good  ? ?Chronic bronchitis: she states gets worse this time of the year when pollen count is high. She quit smoking, but still vaping. She denies a cough, no wheezing or sob at this time She also takes singulair since she has RAD ? ? ?Patient Active Problem List  ? Diagnosis Date Noted  ? Leg pain 04/08/2020  ? Dyslipidemia 06/29/2019  ? History of gestational diabetes 02/13/2017  ? Avitaminosis D 02/15/2016  ? Obstructive apnea 02/15/2016  ? Blood glucose elevated 02/15/2016  ? Abnormal LFTs 02/15/2016  ? Dermatitis, eczematoid 02/15/2016  ? Generalized anxiety disorder 10/11/2015  ? Simple chronic bronchitis (HCC) 10/11/2015  ? Chronic back pain 10/11/2015  ? Chronic constipation 10/11/2015  ? Reflux esophagitis 05/01/2007   ? ? ?Past Surgical History:  ?Procedure Laterality Date  ? BREAST EXCISIONAL BIOPSY Left 1996  ? neg  ? ? ?Family History  ?Problem Relation Age of Onset  ? Breast cancer Mother 54  ? Alzheimer's disease Mother   ? Kidney disease Mother   ? Breast cancer Maternal Aunt 60  ? Breast cancer Paternal Grandmother   ? Polycythemia Maternal Grandmother   ? ? ?Social History  ? ?Tobacco Use  ? Smoking status: Some Days  ?  Packs/day: 0.25  ?  Years: 38.00  ?  Pack years: 9.50  ?  Types: E-cigarettes, Cigarettes  ? Smokeless tobacco: Never  ?Substance Use Topics  ? Alcohol use: No  ?  Alcohol/week: 0.0 standard drinks  ? ? ? ?Current Outpatient Medications:  ?  albuterol (VENTOLIN HFA) 108 (90 Base) MCG/ACT inhaler, INHALE 2 PUFFS BY MOUTH EVERY 6 HOURS AS NEEDED FOR WHEEZING OR SHORTNESS OF BREATH, Disp: 18 g, Rfl: 1 ?  azelastine (ASTELIN) 0.1 % nasal spray, Place 2 sprays into both nostrils 2 (two) times daily. Use in each nostril as directed, Disp: 30 mL, Rfl: 1 ?  busPIRone (BUSPAR) 5 MG tablet, TAKE 1 TABLET BY MOUTH THREE TIMES DAILY, Disp: 90 tablet, Rfl: 5 ?  Cholecalciferol (VITAMIN D) 50 MCG (2000 UT) CAPS, Take 1 capsule (2,000 Units total) by mouth daily., Disp: 30 capsule, Rfl: 0 ?  cyclobenzaprine (FLEXERIL) 5 MG tablet,  Take 1 tablet (5 mg total) by mouth 3 (three) times daily as needed for muscle spasms., Disp: 90 tablet, Rfl: 0 ?  EQ ALLERGY RELIEF 10 MG tablet, Take 1 tablet by mouth once daily, Disp: 90 tablet, Rfl: 0 ?  fluticasone (FLONASE) 50 MCG/ACT nasal spray, Use 2 spray(s) in each nostril once daily, Disp: 16 g, Rfl: 0 ?  losartan (COZAAR) 50 MG tablet, Take 1 tablet by mouth once daily, Disp: 90 tablet, Rfl: 0 ?  meclizine (ANTIVERT) 12.5 MG tablet, Take 1 tablet (12.5 mg total) by mouth 3 (three) times daily as needed for dizziness., Disp: 30 tablet, Rfl: 0 ?  montelukast (SINGULAIR) 10 MG tablet, TAKE 1 TABLET BY MOUTH AT BEDTIME, Disp: 90 tablet, Rfl: 0 ?  rosuvastatin (CRESTOR) 10 MG tablet,  Take 1 tablet by mouth once daily, Disp: 90 tablet, Rfl: 0 ? ?Allergies  ?Allergen Reactions  ? Penicillins Swelling  ? Simvastatin   ? Zoloft [Sertraline Hcl] Other (See Comments)  ?  Shop lift  ? ? ?I personally reviewed active problem list, medication list, allergies, family history, social history, health maintenance with the patient/caregiver today. ? ? ?ROS ? ?Constitutional: Negative for fever or weight change.  ?Respiratory: Negative for cough and shortness of breath.   ?Cardiovascular: Negative for chest pain or palpitations.  ?Gastrointestinal: Negative for abdominal pain, no bowel changes.  ?Musculoskeletal: Negative for gait problem or joint swelling.  ?Skin: Negative for rash.  ?Neurological: Negative for dizziness or headache.  ?No other specific complaints in a complete review of systems (except as listed in HPI above).  ? ?Objective ? ?Vitals:  ? 01/03/22 1131  ?BP: 128/72  ?Pulse: 92  ?Resp: 14  ?Temp: 98 ?F (36.7 ?C)  ?TempSrc: Oral  ?SpO2: 94%  ?Weight: 129 lb 14.4 oz (58.9 kg)  ?Height: 5' (1.524 m)  ? ? ?Body mass index is 25.37 kg/m?. ? ?Physical Exam ? ?Constitutional: Patient appears well-developed and well-nourished.  No distress.  ?HEENT: head atraumatic, normocephalic, pupils equal and reactive to light,  neck supple ?Cardiovascular: Normal rate, regular rhythm and normal heart sounds.  No murmur heard. No BLE edema. ?Pulmonary/Chest: Effort normal and breath sounds normal. No respiratory distress. ?Abdominal: Soft.  There is no tenderness. ?Psychiatric: Patient has a normal mood and affect. behavior is normal. Judgment and thought content normal.  ? ? ?PHQ2/9: ? ?  01/03/2022  ? 11:33 AM 06/28/2021  ? 10:48 AM 05/28/2021  ? 10:52 AM 04/02/2021  ? 11:07 AM 02/14/2021  ?  8:28 AM  ?Depression screen PHQ 2/9  ?Decreased Interest 0 0 0 0 0  ?Down, Depressed, Hopeless 0 0 0 0 0  ?PHQ - 2 Score 0 0 0 0 0  ?  ?phq 9 is negative ? ? ?Fall Risk: ? ?  01/03/2022  ? 11:33 AM 06/28/2021  ? 10:48 AM  05/28/2021  ? 10:54 AM 04/02/2021  ? 11:07 AM 02/14/2021  ?  8:28 AM  ?Fall Risk   ?Falls in the past year? 0 0 0 0 0  ?Number falls in past yr:  0 0 0 0  ?Injury with Fall?  0 0 0 0  ?Risk for fall due to :  No Fall Risks No Fall Risks    ?Follow up Falls prevention discussed Falls prevention discussed Falls prevention discussed  Falls prevention discussed  ? ? ? ? ?Functional Status Survey: ?Is the patient deaf or have difficulty hearing?: No ?Does the patient have difficulty seeing, even when wearing  glasses/contacts?: No ?Does the patient have difficulty concentrating, remembering, or making decisions?: No ?Does the patient have difficulty walking or climbing stairs?: No ?Does the patient have difficulty dressing or bathing?: No ?Does the patient have difficulty doing errands alone such as visiting a doctor's office or shopping?: No ? ? ? ?Assessment & Plan ? ?1. Simple chronic bronchitis (HCC) ? ?- albuterol (VENTOLIN HFA) 108 (90 Base) MCG/ACT inhaler; INHALE 2 PUFFS BY MOUTH EVERY 6 HOURS AS NEEDED FOR WHEEZING OR SHORTNESS OF BREATH  Dispense: 18 g; Refill: 1 ? ?2. Hypertension, benign ? ?- losartan (COZAAR) 50 MG tablet; Take 1 tablet (50 mg total) by mouth daily.  Dispense: 90 tablet; Refill: 1 ? ?3. Vitamin D deficiency ? ? ?4. Pre-diabetes ? ?- Hemoglobin A1c ? ?5. Hyperglycemia ? ?- Hemoglobin A1c ? ?6. Skin lesion of face ? ?- Ambulatory referral to Dermatology ? ?7. GAD (generalized anxiety disorder) ? ?- busPIRone (BUSPAR) 5 MG tablet; Take 1 tablet (5 mg total) by mouth 3 (three) times daily.  Dispense: 270 tablet; Refill: 1 ? ?8. Dyslipidemia ? ?- rosuvastatin (CRESTOR) 10 MG tablet; Take 1 tablet (10 mg total) by mouth daily.  Dispense: 90 tablet; Refill: 1 ? ?9. Muscle spasm of back ? ?- cyclobenzaprine (FLEXERIL) 5 MG tablet; Take 1 tablet (5 mg total) by mouth 3 (three) times daily as needed for muscle spasms.  Dispense: 90 tablet; Refill: 1 ? ?10. Seasonal allergic rhinitis due to pollen ? ?-  montelukast (SINGULAIR) 10 MG tablet; Take 1 tablet (10 mg total) by mouth at bedtime.  Dispense: 90 tablet; Refill: 1 ?- fluticasone (FLONASE) 50 MCG/ACT nasal spray; Place 2 sprays into both nostrils daily.  Dispense

## 2022-01-03 ENCOUNTER — Ambulatory Visit (INDEPENDENT_AMBULATORY_CARE_PROVIDER_SITE_OTHER): Payer: Medicare HMO | Admitting: Family Medicine

## 2022-01-03 ENCOUNTER — Encounter: Payer: Self-pay | Admitting: Family Medicine

## 2022-01-03 VITALS — BP 128/72 | HR 92 | Temp 98.0°F | Resp 14 | Ht 60.0 in | Wt 129.9 lb

## 2022-01-03 DIAGNOSIS — M62830 Muscle spasm of back: Secondary | ICD-10-CM | POA: Diagnosis not present

## 2022-01-03 DIAGNOSIS — R7303 Prediabetes: Secondary | ICD-10-CM

## 2022-01-03 DIAGNOSIS — L989 Disorder of the skin and subcutaneous tissue, unspecified: Secondary | ICD-10-CM

## 2022-01-03 DIAGNOSIS — E785 Hyperlipidemia, unspecified: Secondary | ICD-10-CM | POA: Diagnosis not present

## 2022-01-03 DIAGNOSIS — Z23 Encounter for immunization: Secondary | ICD-10-CM

## 2022-01-03 DIAGNOSIS — R739 Hyperglycemia, unspecified: Secondary | ICD-10-CM

## 2022-01-03 DIAGNOSIS — F411 Generalized anxiety disorder: Secondary | ICD-10-CM

## 2022-01-03 DIAGNOSIS — I10 Essential (primary) hypertension: Secondary | ICD-10-CM

## 2022-01-03 DIAGNOSIS — E559 Vitamin D deficiency, unspecified: Secondary | ICD-10-CM

## 2022-01-03 DIAGNOSIS — Z1211 Encounter for screening for malignant neoplasm of colon: Secondary | ICD-10-CM

## 2022-01-03 DIAGNOSIS — J301 Allergic rhinitis due to pollen: Secondary | ICD-10-CM

## 2022-01-03 DIAGNOSIS — J410 Simple chronic bronchitis: Secondary | ICD-10-CM

## 2022-01-03 MED ORDER — MONTELUKAST SODIUM 10 MG PO TABS: 10.0000 mg | ORAL_TABLET | Freq: Every day | ORAL | 1 refills | Status: DC

## 2022-01-03 MED ORDER — ROSUVASTATIN CALCIUM 10 MG PO TABS: 10.0000 mg | ORAL_TABLET | Freq: Every day | ORAL | 1 refills | Status: DC

## 2022-01-03 MED ORDER — LOSARTAN POTASSIUM 50 MG PO TABS: 50.0000 mg | ORAL_TABLET | Freq: Every day | ORAL | 1 refills | Status: DC

## 2022-01-03 MED ORDER — CYCLOBENZAPRINE HCL 5 MG PO TABS: 5.0000 mg | ORAL_TABLET | Freq: Three times a day (TID) | ORAL | 1 refills | Status: DC | PRN

## 2022-01-03 MED ORDER — AZELASTINE HCL 0.1 % NA SOLN: 2.0000 | Freq: Two times a day (BID) | NASAL | 1 refills | Status: DC

## 2022-01-03 MED ORDER — FLUTICASONE PROPIONATE 50 MCG/ACT NA SUSP: 2.0000 | Freq: Every day | NASAL | 1 refills | Status: DC

## 2022-01-03 MED ORDER — TETANUS-DIPHTH-ACELL PERTUSSIS 5-2-15.5 LF-MCG/0.5 IM SUSP: 0.5000 mL | Freq: Once | INTRAMUSCULAR | 0 refills | Status: AC

## 2022-01-03 MED ORDER — BUSPIRONE HCL 5 MG PO TABS: 5.0000 mg | ORAL_TABLET | Freq: Three times a day (TID) | ORAL | 1 refills | Status: DC

## 2022-01-03 MED ORDER — ALBUTEROL SULFATE HFA 108 (90 BASE) MCG/ACT IN AERS: INHALATION_SPRAY | RESPIRATORY_TRACT | 1 refills | Status: AC

## 2022-01-04 LAB — HEMOGLOBIN A1C
Hgb A1c MFr Bld: 6.1 % of total Hgb — ABNORMAL HIGH (ref <5.7)
Mean Plasma Glucose: 128 mg/dL
eAG (mmol/L): 7.1 mmol/L

## 2022-01-07 ENCOUNTER — Telehealth: Payer: Self-pay

## 2022-01-07 NOTE — Telephone Encounter (Signed)
CALLED PATIENT NO ANSWER LEFT VOICEMAIL FOR A CALL BACK ? ?

## 2022-01-08 ENCOUNTER — Telehealth: Payer: Self-pay

## 2022-01-08 NOTE — Telephone Encounter (Signed)
CALLED PATIENT NO ANSWER LEFT VOICEMAIL FOR A CALL BACK ? ?

## 2022-02-22 DIAGNOSIS — M9901 Segmental and somatic dysfunction of cervical region: Secondary | ICD-10-CM | POA: Diagnosis not present

## 2022-02-22 DIAGNOSIS — M9902 Segmental and somatic dysfunction of thoracic region: Secondary | ICD-10-CM | POA: Diagnosis not present

## 2022-02-22 DIAGNOSIS — R519 Headache, unspecified: Secondary | ICD-10-CM | POA: Diagnosis not present

## 2022-02-22 DIAGNOSIS — M5134 Other intervertebral disc degeneration, thoracic region: Secondary | ICD-10-CM | POA: Diagnosis not present

## 2022-03-05 DIAGNOSIS — H524 Presbyopia: Secondary | ICD-10-CM | POA: Diagnosis not present

## 2022-03-05 DIAGNOSIS — Z0100 Encounter for examination of eyes and vision without abnormal findings: Secondary | ICD-10-CM | POA: Diagnosis not present

## 2022-03-24 DIAGNOSIS — M9901 Segmental and somatic dysfunction of cervical region: Secondary | ICD-10-CM | POA: Diagnosis not present

## 2022-03-24 DIAGNOSIS — M5134 Other intervertebral disc degeneration, thoracic region: Secondary | ICD-10-CM | POA: Diagnosis not present

## 2022-03-24 DIAGNOSIS — R519 Headache, unspecified: Secondary | ICD-10-CM | POA: Diagnosis not present

## 2022-03-24 DIAGNOSIS — M9902 Segmental and somatic dysfunction of thoracic region: Secondary | ICD-10-CM | POA: Diagnosis not present

## 2022-04-09 IMAGING — US US RENAL
1 series · 14 of 25 positions shown · non-contrast
Comparison: September 23, 2012.

CLINICAL DATA: Microscopic hematuria, left kidney stone.

EXAM:
RENAL / URINARY TRACT ULTRASOUND COMPLETE

[Series 1: us renal · 0.22mm/px · 14 of 39 slices shown]
[im 1/39]
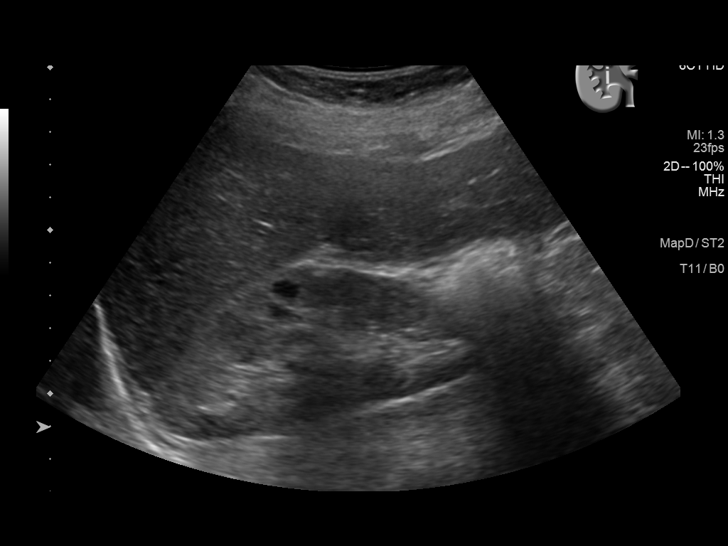
[im 4/39]
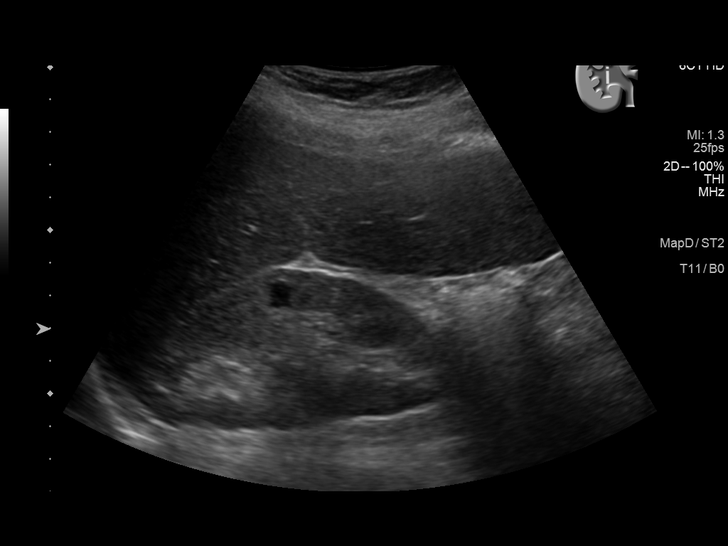
[im 7/39]
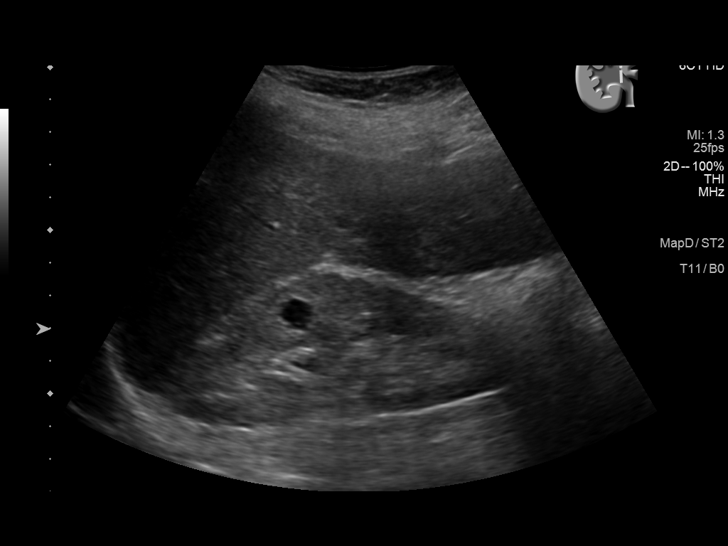
[im 10/39]
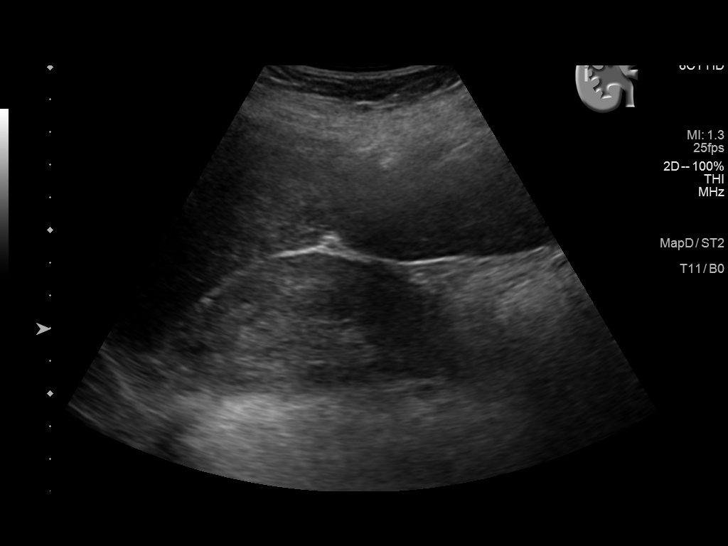
[im 13/39]
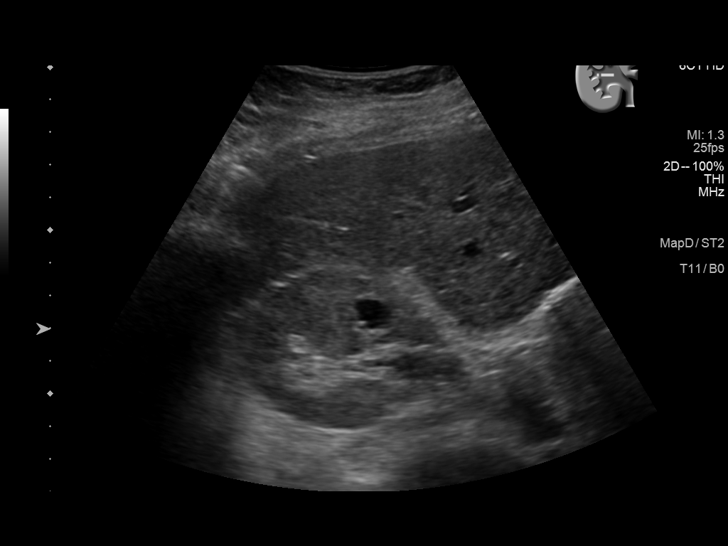
[im 15/39]
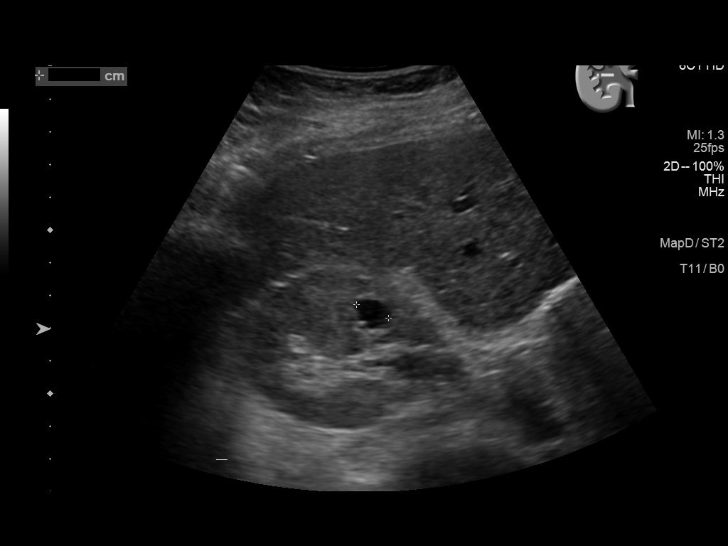
[im 18/39]
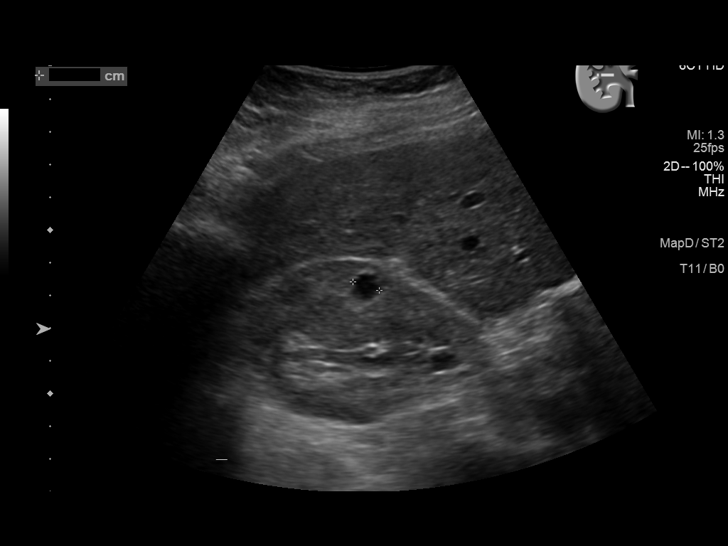
[im 21/39]
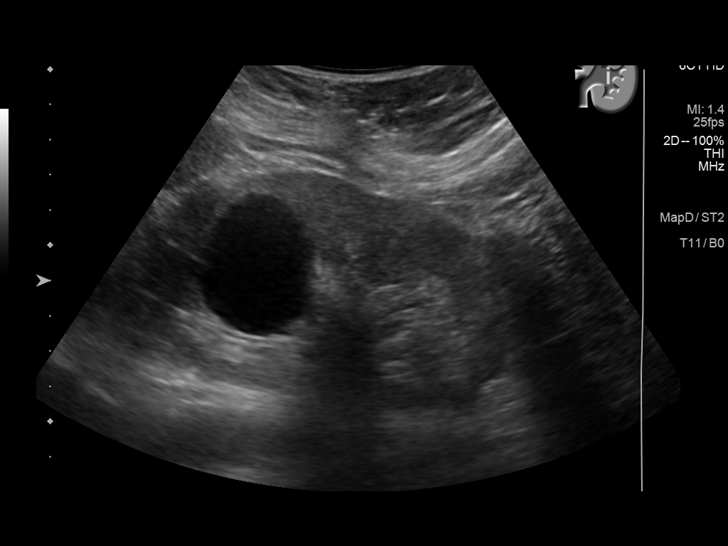
[im 24/39]
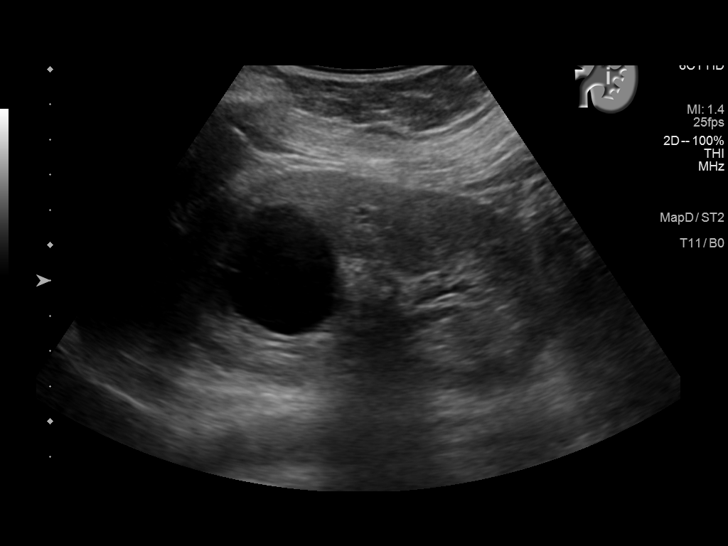
[im 26/39]
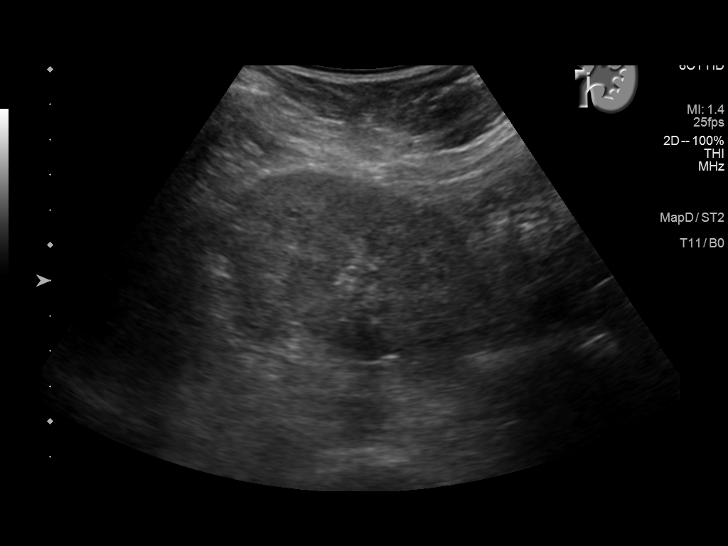
[im 29/39]
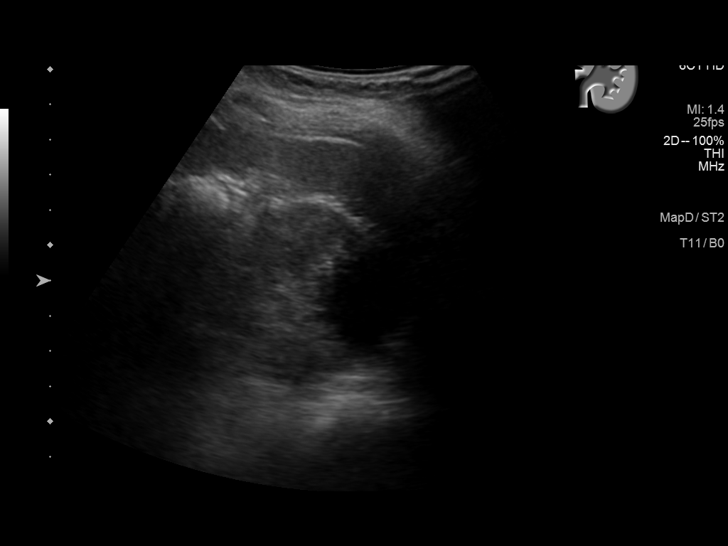
[im 32/39]
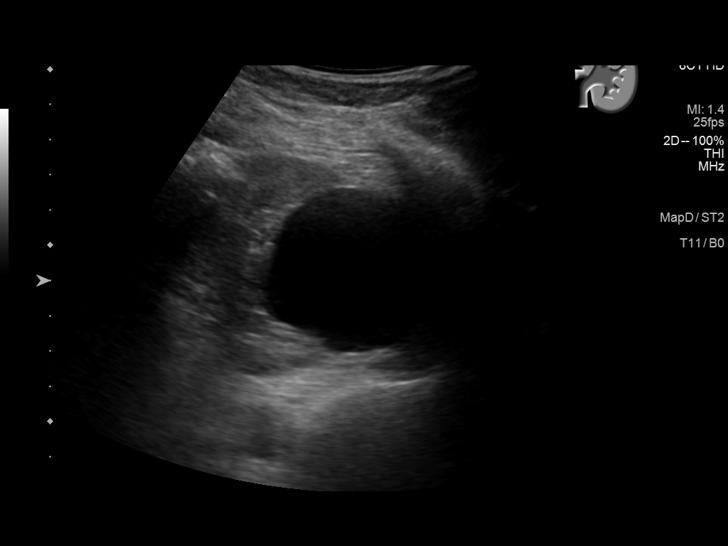
[im 35/39]
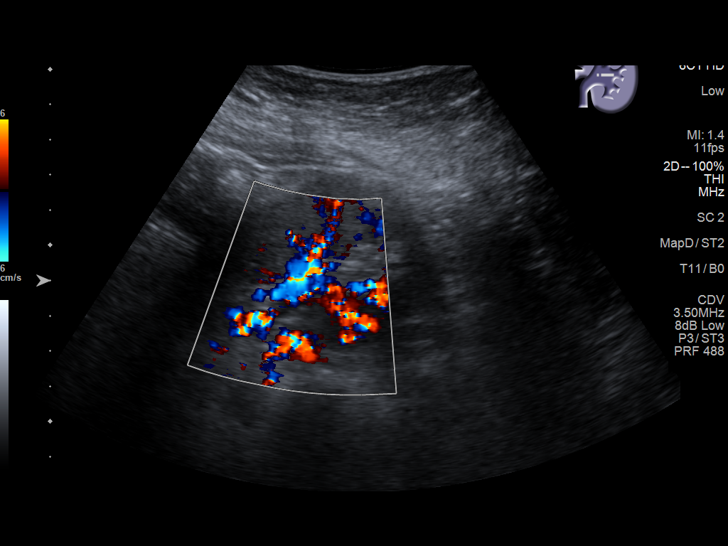
[im 39/39]
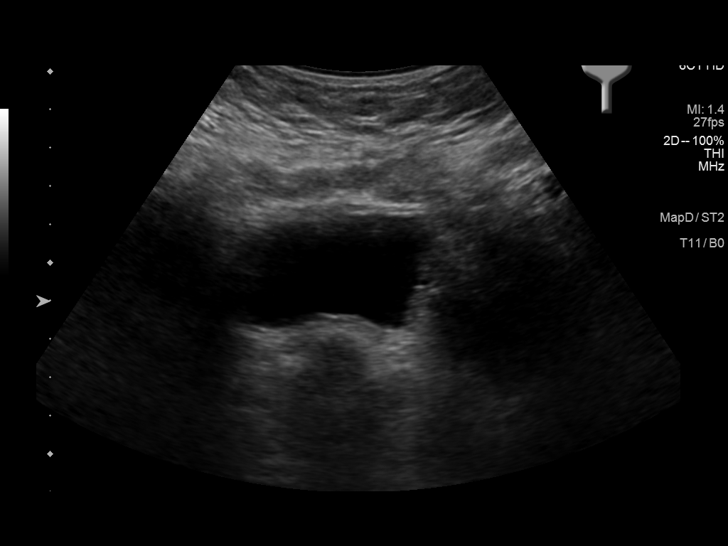

[14 of 25 positions shown; findings below may reference images not displayed]

FINDINGS: Right Kidney:

Renal measurements: 10.6 x 5.3 x 4.7 cm = volume: 138 mL. Two simple
cysts are noted, the largest measuring 1 cm in the midpole.
Echogenicity within normal limits. No mass or hydronephrosis
visualized.

Left Kidney:

Renal measurements: 10.1 x 5.9 x 5.5 cm = volume: 173 mL. 4.9 cm
simple cyst is seen involving upper pole which is enlarged compared
to prior exam. Echogenicity within normal limits. No mass or
hydronephrosis visualized.

Bladder:

Appears normal for degree of bladder distention.

Other:

None.
IMPRESSION: Bilateral simple renal cysts are noted. No other renal abnormality
is noted.

## 2022-04-28 DIAGNOSIS — R519 Headache, unspecified: Secondary | ICD-10-CM | POA: Diagnosis not present

## 2022-04-28 DIAGNOSIS — M5134 Other intervertebral disc degeneration, thoracic region: Secondary | ICD-10-CM | POA: Diagnosis not present

## 2022-04-28 DIAGNOSIS — M9901 Segmental and somatic dysfunction of cervical region: Secondary | ICD-10-CM | POA: Diagnosis not present

## 2022-04-28 DIAGNOSIS — M9902 Segmental and somatic dysfunction of thoracic region: Secondary | ICD-10-CM | POA: Diagnosis not present

## 2022-05-02 NOTE — Progress Notes (Unsigned)
Name: Kristine Lowe   MRN: [id number]    DOB: 02-28-1956   Date:05/05/2022       Progress Note  Subjective  Chief Complaint  Follow Up  HPI  Hematuria: found on urine dip, negative culture , last urine microscopy also positive , previous history of kidney stones found on CT done in 2006, she went back to Urologist 02/2021 , she has small left kidney stone and was advised follow up prn Unchanged   Mood Disorder: : long history of mood changes, she has tried multiple medications and currently on  Buspar TID but only responds to brand name, usually two to three times daily . Over the past two weeks she has been feeling worse, she  has noticed sad, irritability , wants to left alone. She took Citalopram in the past but asked about Abilify since mother responded well to medication. She cannot take Zoloft because it caused her to shoplift in the past    Pre-diabetes: A1C was slightly higher at 6..1 %  she gets sleepy after a high carb diet, starts at about 30 minutes after a meal and lasts for about 1.5 hours.  Denies polyphagia, polydipsia or polyuria. She is trying to avoid a lot of carbs   HTN: bp is at goal, no chest pain or palpitation. Denies SOB with activity or dizziness Continue medication   Dyslipidemia: Last LDL was very high still at 195 , it was down to 97 when she was taking Crestor, compliant and we will recheck next visit   Vitamin D deficiency: continue otc supplementation   Chronic bronchitis:  She quit smoking, but still vaping. She denies a cough, no wheezing or sob at this time She also takes singulair since she has RAD, she states humidity causes some SOB  Left knee pain: she states she has a burning sensation of left anterior knee, no effusion or erythema   Patient Active Problem List   Diagnosis Date Noted   Leg pain 04/08/2020   Dyslipidemia 06/29/2019   History of gestational diabetes 02/13/2017   Avitaminosis D 02/15/2016   Obstructive apnea 02/15/2016    Blood glucose elevated 02/15/2016   Abnormal LFTs 02/15/2016   Dermatitis, eczematoid 02/15/2016   Generalized anxiety disorder 10/11/2015   Simple chronic bronchitis (HCC) 10/11/2015   Chronic back pain 10/11/2015   Chronic constipation 10/11/2015   Reflux esophagitis 05/01/2007    Past Surgical History:  Procedure Laterality Date   BREAST EXCISIONAL BIOPSY Left 1996   neg    Family History  Problem Relation Age of Onset   Breast cancer Mother 56   Alzheimer's disease Mother    Kidney disease Mother    Breast cancer Maternal Aunt 88   Breast cancer Paternal Grandmother    Polycythemia Maternal Grandmother     Social History   Tobacco Use   Smoking status: Some Days    Packs/day: 0.25    Years: 38.00    Total pack years: 9.50    Types: E-cigarettes, Cigarettes   Smokeless tobacco: Never  Substance Use Topics   Alcohol use: No    Alcohol/week: 0.0 standard drinks of alcohol     Current Outpatient Medications:    albuterol (VENTOLIN HFA) 108 (90 Base) MCG/ACT inhaler, INHALE 2 PUFFS BY MOUTH EVERY 6 HOURS AS NEEDED FOR WHEEZING OR SHORTNESS OF BREATH, Disp: 18 g, Rfl: 1   azelastine (ASTELIN) 0.1 % nasal spray, Place 2 sprays into both nostrils 2 (two) times daily. Use in each nostril as  directed, Disp: 30 mL, Rfl: 1   busPIRone (BUSPAR) 5 MG tablet, Take 1 tablet (5 mg total) by mouth 3 (three) times daily., Disp: 270 tablet, Rfl: 1   Cholecalciferol (VITAMIN D) 50 MCG (2000 UT) CAPS, Take 1 capsule (2,000 Units total) by mouth daily., Disp: 30 capsule, Rfl: 0   cyclobenzaprine (FLEXERIL) 5 MG tablet, Take 1 tablet (5 mg total) by mouth 3 (three) times daily as needed for muscle spasms., Disp: 90 tablet, Rfl: 1   EQ ALLERGY RELIEF 10 MG tablet, Take 1 tablet by mouth once daily, Disp: 90 tablet, Rfl: 0   fluticasone (FLONASE) 50 MCG/ACT nasal spray, Place 2 sprays into both nostrils daily., Disp: 16 g, Rfl: 1   losartan (COZAAR) 50 MG tablet, Take 1 tablet (50 mg  total) by mouth daily., Disp: 90 tablet, Rfl: 1   meclizine (ANTIVERT) 12.5 MG tablet, Take 1 tablet (12.5 mg total) by mouth 3 (three) times daily as needed for dizziness., Disp: 30 tablet, Rfl: 0   montelukast (SINGULAIR) 10 MG tablet, Take 1 tablet (10 mg total) by mouth at bedtime., Disp: 90 tablet, Rfl: 1   rosuvastatin (CRESTOR) 10 MG tablet, Take 1 tablet (10 mg total) by mouth daily., Disp: 90 tablet, Rfl: 1  Allergies  Allergen Reactions   Penicillins Swelling   Simvastatin    Zoloft [Sertraline Hcl] Other (See Comments)    Shop lift    I personally reviewed active problem list, medication list, allergies, family history, social history, health maintenance with the patient/caregiver today.   ROS  Constitutional: Negative for fever or weight change.  Respiratory: Negative for cough and shortness of breath.   Cardiovascular: Negative for chest pain or palpitations.  Gastrointestinal: Negative for abdominal pain, no bowel changes.  Musculoskeletal: Negative for gait problem or joint swelling.  Skin: Negative for rash.  Neurological: Negative for dizziness or headache.  No other specific complaints in a complete review of systems (except as listed in HPI above).   Objective  Vitals:   05/05/22 1147  BP: 136/78  Pulse: 92  Resp: 16  SpO2: 97%  Weight: 126 lb (57.2 kg)  Height: 5' (1.524 m)    Body mass index is 24.61 kg/m.  Physical Exam  Constitutional: Patient appears well-developed and well-nourished.  No distress HEENT: head atraumatic, normocephalic, pupils equal and reactive to light, neck supple, throat within normal limits Cardiovascular: Normal rate, regular rhythm and normal heart sounds.  No murmur heard. No BLE edema. Pulmonary/Chest: Effort normal and breath sounds normal. No respiratory distress. Abdominal: Soft.  There is no tenderness. Psychiatric: Patient has a normal mood and affect. behavior is normal. Judgment and thought content normal.    PHQ2/9:    05/05/2022   11:46 AM 01/03/2022   11:33 AM 06/28/2021   10:48 AM 05/28/2021   10:52 AM 04/02/2021   11:07 AM  Depression screen PHQ 2/9  Decreased Interest 0 0 0 0 0  Down, Depressed, Hopeless 3 0 0 0 0  PHQ - 2 Score 3 0 0 0 0  Altered sleeping 0      Tired, decreased energy 0      Change in appetite 0      Feeling bad or failure about yourself  0      Trouble concentrating 1      Moving slowly or fidgety/restless 0      Suicidal thoughts 0      PHQ-9 Score 4      Difficult doing work/chores  Somewhat difficult        phq 9 is positive   Fall Risk:    05/05/2022   11:46 AM 01/03/2022   11:33 AM 06/28/2021   10:48 AM 05/28/2021   10:54 AM 04/02/2021   11:07 AM  Fall Risk   Falls in the past year? 0 0 0 0 0  Number falls in past yr: 0  0 0 0  Injury with Fall? 0  0 0 0  Risk for fall due to : No Fall Risks  No Fall Risks No Fall Risks   Follow up Falls prevention discussed Falls prevention discussed Falls prevention discussed Falls prevention discussed       Functional Status Survey: Is the patient deaf or have difficulty hearing?: No Does the patient have difficulty seeing, even when wearing glasses/contacts?: No Does the patient have difficulty concentrating, remembering, or making decisions?: No Does the patient have difficulty walking or climbing stairs?: No Does the patient have difficulty dressing or bathing?: No Does the patient have difficulty doing errands alone such as visiting a doctor's office or shopping?: No    Assessment & Plan  1. Mood disorder (HCC)  - ARIPiprazole (ABILIFY) 5 MG tablet; Take 1 tablet (5 mg total) by mouth every evening.  Dispense: 90 tablet; Refill: 0 - citalopram (CELEXA) 10 MG tablet; Take 1 tablet (10 mg total) by mouth daily.  Dispense: 90 tablet; Refill: 0    2. Dyslipidemia  Continue medications, recheck next visit  3. Hypertension, benign  At goal   4. Vitamin D deficiency   5. Pre-diabetes   6.  Muscle spasm of back  Also has intermittent low back pain and may be the cause of left knee burning sensation

## 2022-05-05 ENCOUNTER — Encounter: Payer: Self-pay | Admitting: Family Medicine

## 2022-05-05 ENCOUNTER — Ambulatory Visit (INDEPENDENT_AMBULATORY_CARE_PROVIDER_SITE_OTHER): Payer: Medicare HMO | Admitting: Family Medicine

## 2022-05-05 VITALS — BP 136/78 | HR 92 | Resp 16 | Ht 60.0 in | Wt 126.0 lb

## 2022-05-05 DIAGNOSIS — R7303 Prediabetes: Secondary | ICD-10-CM

## 2022-05-05 DIAGNOSIS — E559 Vitamin D deficiency, unspecified: Secondary | ICD-10-CM | POA: Diagnosis not present

## 2022-05-05 DIAGNOSIS — F39 Unspecified mood [affective] disorder: Secondary | ICD-10-CM

## 2022-05-05 DIAGNOSIS — E785 Hyperlipidemia, unspecified: Secondary | ICD-10-CM

## 2022-05-05 DIAGNOSIS — I10 Essential (primary) hypertension: Secondary | ICD-10-CM | POA: Diagnosis not present

## 2022-05-05 DIAGNOSIS — M62830 Muscle spasm of back: Secondary | ICD-10-CM

## 2022-05-05 MED ORDER — CITALOPRAM HYDROBROMIDE 10 MG PO TABS: 10.0000 mg | ORAL_TABLET | Freq: Every day | ORAL | 0 refills | Status: DC

## 2022-05-05 MED ORDER — ARIPIPRAZOLE 5 MG PO TABS: 5.0000 mg | ORAL_TABLET | Freq: Every evening | ORAL | 0 refills | Status: DC

## 2022-06-03 ENCOUNTER — Ambulatory Visit (INDEPENDENT_AMBULATORY_CARE_PROVIDER_SITE_OTHER): Payer: Medicare HMO

## 2022-06-03 VITALS — BP 132/80 | HR 99 | Temp 97.7°F | Resp 14 | Ht 60.0 in | Wt 127.0 lb

## 2022-06-03 DIAGNOSIS — N644 Mastodynia: Secondary | ICD-10-CM | POA: Diagnosis not present

## 2022-06-03 DIAGNOSIS — Z0000 Encounter for general adult medical examination without abnormal findings: Secondary | ICD-10-CM

## 2022-06-03 DIAGNOSIS — Z1211 Encounter for screening for malignant neoplasm of colon: Secondary | ICD-10-CM | POA: Diagnosis not present

## 2022-06-03 NOTE — Progress Notes (Signed)
Subjective:   Kristine Lowe is a 66 y.o. female who presents for Medicare Annual (Subsequent) preventive examination.  Review of Systems    Defer to PCP       Objective:    There were no vitals filed for this visit. There is no height or weight on file to calculate BMI.     05/28/2021   10:53 AM 05/24/2020   11:16 AM 05/11/2019    5:03 AM 02/11/2017    7:27 PM 02/15/2016   11:05 AM 10/11/2015   10:57 AM  Advanced Directives  Does Patient Have a Medical Advance Directive? No No No No No No  Would patient like information on creating a medical advance directive? Yes (MAU/Ambulatory/Procedural Areas - Information given) No - Patient declined No - Patient declined No - Patient declined No - patient declined information No - patient declined information    Current Medications (verified) Outpatient Encounter Medications as of 06/03/2022  Medication Sig   albuterol (VENTOLIN HFA) 108 (90 Base) MCG/ACT inhaler INHALE 2 PUFFS BY MOUTH EVERY 6 HOURS AS NEEDED FOR WHEEZING OR SHORTNESS OF BREATH   ARIPiprazole (ABILIFY) 5 MG tablet Take 1 tablet (5 mg total) by mouth every evening.   azelastine (ASTELIN) 0.1 % nasal spray Place 2 sprays into both nostrils 2 (two) times daily. Use in each nostril as directed   busPIRone (BUSPAR) 5 MG tablet Take 1 tablet (5 mg total) by mouth 3 (three) times daily.   Cholecalciferol (VITAMIN D) 50 MCG (2000 UT) CAPS Take 1 capsule (2,000 Units total) by mouth daily.   citalopram (CELEXA) 10 MG tablet Take 1 tablet (10 mg total) by mouth daily.   cyclobenzaprine (FLEXERIL) 5 MG tablet Take 1 tablet (5 mg total) by mouth 3 (three) times daily as needed for muscle spasms.   EQ ALLERGY RELIEF 10 MG tablet Take 1 tablet by mouth once daily   fluticasone (FLONASE) 50 MCG/ACT nasal spray Place 2 sprays into both nostrils daily.   losartan (COZAAR) 50 MG tablet Take 1 tablet (50 mg total) by mouth daily.   meclizine (ANTIVERT) 12.5 MG tablet Take 1 tablet (12.5  mg total) by mouth 3 (three) times daily as needed for dizziness.   montelukast (SINGULAIR) 10 MG tablet Take 1 tablet (10 mg total) by mouth at bedtime.   rosuvastatin (CRESTOR) 10 MG tablet Take 1 tablet (10 mg total) by mouth daily.   No facility-administered encounter medications on file as of 06/03/2022.    Allergies (verified) Penicillins, Simvastatin, and Zoloft [sertraline hcl]   History: Past Medical History:  Diagnosis Date   Allergy    Anxiety    Chronic obstructive airway disease (HCC)    Dizziness and giddiness    Esophageal reflux    Hypertension    Insomnia    Lumbago    Lumbosacral neuritis    Pure hypercholesterolemia    Past Surgical History:  Procedure Laterality Date   BREAST EXCISIONAL BIOPSY Left 1996   neg   Family History  Problem Relation Age of Onset   Breast cancer Mother 61   Alzheimer's disease Mother    Kidney disease Mother    Breast cancer Maternal Aunt 31   Breast cancer Paternal Grandmother    Polycythemia Maternal Grandmother    Social History   Socioeconomic History   Marital status: Married    Spouse name: Not on file   Number of children: 7   Years of education: Not on file   Highest education  level: Some college, no degree  Occupational History   Occupation: member Teaching laboratory technician   Tobacco Use   Smoking status: Some Days    Packs/day: 0.25    Years: 38.00    Total pack years: 9.50    Types: E-cigarettes, Cigarettes   Smokeless tobacco: Never  Vaping Use   Vaping Use: Some days  Substance and Sexual Activity   Alcohol use: No    Alcohol/week: 0.0 standard drinks of alcohol   Drug use: No   Sexual activity: Yes  Other Topics Concern   Not on file  Social History Narrative   Married , has 7 grown children, working part time   International aid/development worker of Corporate investment banker Strain: Low Risk  (06/28/2021)   Overall Financial Resource Strain (CARDIA)    Difficulty of Paying Living Expenses: Not hard at all  Food  Insecurity: No Food Insecurity (06/28/2021)   Hunger Vital Sign    Worried About Running Out of Food in the Last Year: Never true    Ran Out of Food in the Last Year: Never true  Transportation Needs: No Transportation Needs (06/28/2021)   PRAPARE - Administrator, Civil Service (Medical): No    Lack of Transportation (Non-Medical): No  Physical Activity: Inactive (06/28/2021)   Exercise Vital Sign    Days of Exercise per Week: 0 days    Minutes of Exercise per Session: 0 min  Stress: No Stress Concern Present (06/28/2021)   [employer] of Occupational Health - Occupational Stress Questionnaire    Feeling of Stress : Only a little  Social Connections: Socially Integrated (06/28/2021)   Social Connection and Isolation Panel [NHANES]    Frequency of Communication with Friends and Family: More than three times a week    Frequency of Social Gatherings with Friends and Family: Three times a week    Attends Religious Services: More than 4 times per year    Active Member of Clubs or Organizations: Yes    Attends Banker Meetings: More than 4 times per year    Marital Status: Married    Tobacco Counseling Ready to quit: Not Answered Counseling given: Not Answered   Clinical Intake:                 Diabetic?N/A         Activities of Daily Living    05/05/2022   11:47 AM 01/03/2022   11:33 AM  In your present state of health, do you have any difficulty performing the following activities:  Hearing? 0 0  Vision? 0 0  Difficulty concentrating or making decisions? 0 0  Walking or climbing stairs? 0 0  Dressing or bathing? 0 0  Doing errands, shopping? 0 0    Patient Care Team: Alba Cory, MD as PCP - General (Family Medicine)  Indicate any recent Medical Services you may have received from other than [facility] providers in the past year (date may be approximate).     Assessment:   This is a routine wellness examination for  [place].  Hearing/Vision screen No results found.  Dietary issues and exercise activities discussed:     Goals Addressed   None   Depression Screen    05/05/2022   11:46 AM 01/03/2022   11:33 AM 06/28/2021   10:48 AM 05/28/2021   10:52 AM 04/02/2021   11:07 AM 02/14/2021    8:28 AM 12/11/2020   10:45 AM  PHQ 2/9 Scores  PHQ - 2 Score  3 0 0 0 0 0 0  PHQ- 9 Score 4          Fall Risk    05/05/2022   11:46 AM 01/03/2022   11:33 AM 06/28/2021   10:48 AM 05/28/2021   10:54 AM 04/02/2021   11:07 AM  Fall Risk   Falls in the past year? 0 0 0 0 0  Number falls in past yr: 0  0 0 0  Injury with Fall? 0  0 0 0  Risk for fall due to : No Fall Risks  No Fall Risks No Fall Risks   Follow up Falls prevention discussed Falls prevention discussed Falls prevention discussed Falls prevention discussed     FALL RISK PREVENTION PERTAINING TO THE HOME:  Any stairs in or around the home? Yes  If so, are there any without handrails? Yes  Home free of loose throw rugs in walkways, pet beds, electrical cords, etc? Yes  Adequate lighting in your home to reduce risk of falls? Yes   ASSISTIVE DEVICES UTILIZED TO PREVENT FALLS:  Life alert? No  Use of a cane, walker or w/c? No  Grab bars in the bathroom? Yes  Shower chair or bench in shower? Yes  Elevated toilet seat or a handicapped toilet? Yes   TIMED UP AND GO:  Was the test performed? Yes .  Length of time to ambulate 10 feet: 3 sec.   Gait steady and fast without use of assistive device  Cognitive Function:        Immunizations Immunization History  Administered Date(s) Administered   Fluad Quad(high Dose 65+) 07/26/2019, 06/26/2020, 06/28/2021   Moderna Sars-Covid-2 Vaccination 11/17/2019, 12/15/2019, 09/15/2020   Pneumococcal Conjugate-13 04/26/2019   Pneumococcal Polysaccharide-23 05/24/2020    TDAP status: Up to date  Flu Vaccine status: Due, Education has been provided regarding the importance of this vaccine. Advised  may receive this vaccine at local pharmacy or Health Dept. Aware to provide a copy of the vaccination record if obtained from local pharmacy or Health Dept. Verbalized acceptance and understanding.  Pneumococcal vaccine status: Up to date  Covid-19 vaccine status: Completed vaccines  Qualifies for Shingles Vaccine? No   Zostavax completed No   Shingrix Completed?: No.    Education has been provided regarding the importance of this vaccine. Patient has been advised to call insurance company to determine out of pocket expense if they have not yet received this vaccine. Advised may also receive vaccine at local pharmacy or Health Dept. Verbalized acceptance and understanding.  Screening Tests Health Maintenance  Topic Date Due   Zoster Vaccines- Shingrix (1 of 2) Never done   COLONOSCOPY (Pts 45-29yrs Insurance coverage will need to be confirmed)  Never done   COVID-19 Vaccine (4 - Moderna risk series) 11/10/2020   INFLUENZA VACCINE  05/06/2022   TETANUS/TDAP  01/04/2023 (Originally 02/05/1975)   MAMMOGRAM  09/23/2022   Pneumonia Vaccine 33+ Years old  Completed   DEXA SCAN  Completed   Hepatitis C Screening  Completed   HPV VACCINES  Aged Out    Health Maintenance  Health Maintenance Due  Topic Date Due   Zoster Vaccines- Shingrix (1 of 2) Never done   COLONOSCOPY (Pts 45-48yrs Insurance coverage will need to be confirmed)  Never done   COVID-19 Vaccine (4 - Moderna risk series) 11/10/2020   INFLUENZA VACCINE  05/06/2022    Colorectal cancer screening: Type of screening: Colonoscopy. Completed n/a. Repeat every 10 years  Mammogram status: Ordered 06/03/2022. Pt  provided with contact info and advised to call to schedule appt.   Bone Density status: Completed 09/23/2021. Results reflect: Bone density results: OSTEOPENIA. Repeat every 3 years.  Lung Cancer Screening: (Low Dose CT Chest recommended if Age 33-80 years, 30 pack-year currently smoking OR have quit w/in 15years.) does  not qualify.   Lung Cancer Screening Referral: n/a  Additional Screening:  Hepatitis C Screening: does not qualify; Completed 04/22/2019  Vision Screening: Recommended annual ophthalmology exams for early detection of glaucoma and other disorders of the eye. Is the patient up to date with their annual eye exam?  Yes  Who is the provider or what is the name of the office in which the patient attends annual eye exams? [facility] If pt is not established with a provider, would they like to be referred to a provider to establish care?  N/A .   Dental Screening: Recommended annual dental exams for proper oral hygiene  Community Resource Referral / Chronic Care Management: CRR required this visit?  Yes   CCM required this visit?  No      Plan:     I have personally reviewed and noted the following in the patient's chart:   Medical and social history Use of alcohol, tobacco or illicit drugs  Current medications and supplements including opioid prescriptions. Patient is not currently taking opioid prescriptions. Functional ability and status Nutritional status Physical activity Advanced directives List of other physicians Hospitalizations, surgeries, and ER visits in previous 12 months Vitals Screenings to include cognitive, depression, and falls Referrals and appointments  In addition, I have reviewed and discussed with patient certain preventive protocols, quality metrics, and best practice recommendations. A written personalized care plan for preventive services as well as general preventive health recommendations were provided to patient.     Benay Pike, CMA   06/03/2022   Nurse Notes: Face to face 28 minutes spent.  Ms. Ginzburg , Thank you for taking time to come for your Medicare Wellness Visit. I appreciate your ongoing commitment to your health goals. Please review the following plan we discussed and let me know if I can assist you in the future.    These are the goals we discussed:  Goals      Increase physical activity     Recommend increasing physical activity to at least 3 times per week for 30 minutes         This is a list of the screening recommended for you and due dates:  Health Maintenance  Topic Date Due   Zoster (Shingles) Vaccine (1 of 2) Never done   Colon Cancer Screening  Never done   COVID-19 Vaccine (4 - Moderna risk series) 11/10/2020   Flu Shot  05/06/2022   Tetanus Vaccine  01/04/2023*   Mammogram  09/23/2022   Pneumonia Vaccine  Completed   DEXA scan (bone density measurement)  Completed   Hepatitis C Screening: USPSTF Recommendation to screen - Ages 15-79 yo.  Completed   HPV Vaccine  Aged Out  *Topic was postponed. The date shown is not the original due date.

## 2022-06-05 ENCOUNTER — Telehealth: Payer: Self-pay

## 2022-06-05 NOTE — Telephone Encounter (Signed)
   Telephone encounter was:  Successful.  06/05/2022 Name: Angelene Rome MRN: [id number] DOB: [birth year]  Virdia Ziesmer is a 66 y.o. year old female who is a primary care patient of Alba Cory, MD . The community resource team was consulted for assistance with Home Modifications  Care guide performed the following interventions: Spoke with patient about sending a referral to Independent Living for handrail installation on front and back steps. Patient gave her permission to place [facility] referral.  Follow Up Plan:  Care guide will follow up with patient by phone over the next 7 days.  Quron Ruddy, AAS Paralegal, [facility] Care Guide  Embedded Care Coordination [facility]  [address] [place], [place] [address] ??[email or web address]  ?? [phone]   [email or web address]

## 2022-06-10 DIAGNOSIS — M9901 Segmental and somatic dysfunction of cervical region: Secondary | ICD-10-CM | POA: Diagnosis not present

## 2022-06-10 DIAGNOSIS — M5134 Other intervertebral disc degeneration, thoracic region: Secondary | ICD-10-CM | POA: Diagnosis not present

## 2022-06-10 DIAGNOSIS — M9902 Segmental and somatic dysfunction of thoracic region: Secondary | ICD-10-CM | POA: Diagnosis not present

## 2022-06-10 DIAGNOSIS — R519 Headache, unspecified: Secondary | ICD-10-CM | POA: Diagnosis not present

## 2022-06-13 ENCOUNTER — Telehealth: Payer: Self-pay

## 2022-06-13 NOTE — Telephone Encounter (Signed)
   Telephone encounter was:  Successful.  06/13/2022 Name: Kristine Lowe MRN: [id number] DOB: 29-Apr-1956  Kristine Lowe is a 66 y.o. year old female who is a primary care patient of Alba Cory, MD . The community resource team was consulted for assistance with Home Modifications and handrail installation for front and back steps.  Care guide performed the following interventions: Spoke with patient to inform her that Magdalene River at [facility] has attempted to call her twice.  The intake process has been started but Lurena Joiner needs to speak to the patient to complete.  I verified the patient's email address and sent her the office number for [facility] [phone]. [name] has my name and number. Also sent a message via [email or web address] to Lurena Joiner to inform her I spoke to the patient and gave her the main number.  Follow Up Plan:  No further follow up planned at this time. The patient has been provided with needed resources.  Min Tunnell [facility]  [facility] Population Health Community Resource Care Guide   [email or web address]  ?? [phone]   Website: [email or web address]  [email or web address]  "We don't say no, we SHOW how!"         [facility]

## 2022-06-20 ENCOUNTER — Ambulatory Visit
Admission: RE | Admit: 2022-06-20 | Discharge: 2022-06-20 | Disposition: A | Discharge status: Home / Self Care | Payer: Medicare HMO | Source: Ambulatory Visit | Attending: Family Medicine | Admitting: Family Medicine

## 2022-06-20 DIAGNOSIS — N644 Mastodynia: Secondary | ICD-10-CM | POA: Diagnosis not present

## 2022-06-20 DIAGNOSIS — Z0000 Encounter for general adult medical examination without abnormal findings: Secondary | ICD-10-CM

## 2022-06-20 DIAGNOSIS — Z803 Family history of malignant neoplasm of breast: Secondary | ICD-10-CM | POA: Diagnosis not present

## 2022-06-27 NOTE — Patient Instructions (Incomplete)
Preventive Care 65 Years and Older, Female Preventive care refers to lifestyle choices and visits with your health care provider that can promote health and wellness. Preventive care visits are also called wellness exams. What can I expect for my preventive care visit? Counseling Your health care provider may ask you questions about your: Medical history, including: Past medical problems. Family medical history. Pregnancy and menstrual history. History of falls. Current health, including: Memory and ability to understand (cognition). Emotional well-being. Home life and relationship well-being. Sexual activity and sexual health. Lifestyle, including: Alcohol, nicotine or tobacco, and drug use. Access to firearms. Diet, exercise, and sleep habits. Work and work environment. Sunscreen use. Safety issues such as seatbelt and bike helmet use. Physical exam Your health care provider will check your: Height and weight. These may be used to calculate your BMI (body mass index). BMI is a measurement that tells if you are at a healthy weight. Waist circumference. This measures the distance around your waistline. This measurement also tells if you are at a healthy weight and may help predict your risk of certain diseases, such as type 2 diabetes and high blood pressure. Heart rate and blood pressure. Body temperature. Skin for abnormal spots. What immunizations do I need?  Vaccines are usually given at various ages, according to a schedule. Your health care provider will recommend vaccines for you based on your age, medical history, and lifestyle or other factors, such as travel or where you work. What tests do I need? Screening Your health care provider may recommend screening tests for certain conditions. This may include: Lipid and cholesterol levels. Hepatitis C test. Hepatitis B test. HIV (human immunodeficiency virus) test. STI (sexually transmitted infection) testing, if you are at  risk. Lung cancer screening. Colorectal cancer screening. Diabetes screening. This is done by checking your blood sugar (glucose) after you have not eaten for a while (fasting). Mammogram. Talk with your health care provider about how often you should have regular mammograms. BRCA-related cancer screening. This may be done if you have a family history of breast, ovarian, tubal, or peritoneal cancers. Bone density scan. This is done to screen for osteoporosis. Talk with your health care provider about your test results, treatment options, and if necessary, the need for more tests. Follow these instructions at home: Eating and drinking  Eat a diet that includes fresh fruits and vegetables, whole grains, lean protein, and low-fat dairy products. Limit your intake of foods with high amounts of sugar, saturated fats, and salt. Take vitamin and mineral supplements as recommended by your health care provider. Do not drink alcohol if your health care provider tells you not to drink. If you drink alcohol: Limit how much you have to 0-1 drink a day. Know how much alcohol is in your drink. In the [place] one drink equals one 12 oz bottle of beer (355 mL), one 5 oz glass of wine (148 mL), or one 1 oz glass of hard liquor (44 mL). Lifestyle Brush your teeth every morning and night with fluoride toothpaste. Floss one time each day. Exercise for at least 30 minutes 5 or more days each week. Do not use any products that contain nicotine or tobacco. These products include cigarettes, chewing tobacco, and vaping devices, such as e-cigarettes. If you need help quitting, ask your health care provider. Do not use drugs. If you are sexually active, practice safe sex. Use a condom or other form of protection in order to prevent STIs. Take aspirin only as told by   your health care provider. Make sure that you understand how much to take and what form to take. Work with your health care provider to find out whether it  is safe and beneficial for you to take aspirin daily. Ask your health care provider if you need to take a cholesterol-lowering medicine (statin). Find healthy ways to manage stress, such as: Meditation, yoga, or listening to music. Journaling. Talking to a trusted person. Spending time with friends and family. Minimize exposure to UV radiation to reduce your risk of skin cancer. Safety Always wear your seat belt while driving or riding in a vehicle. Do not drive: If you have been drinking alcohol. Do not ride with someone who has been drinking. When you are tired or distracted. While texting. If you have been using any mind-altering substances or drugs. Wear a helmet and other protective equipment during sports activities. If you have firearms in your house, make sure you follow all gun safety procedures. What's next? Visit your health care provider once a year for an annual wellness visit. Ask your health care provider how often you should have your eyes and teeth checked. Stay up to date on all vaccines. This information is not intended to replace advice given to you by your health care provider. Make sure you discuss any questions you have with your health care provider. Document Revised: 03/20/2021 Document Reviewed: 03/20/2021 Elsevier Patient Education  2023 [employer]  

## 2022-06-27 NOTE — Progress Notes (Unsigned)
Name: Kristine Lowe   MRN: [id number]    DOB: 09/25/1956   Date:06/30/2022       Progress Note  Subjective  Chief Complaint  Annual Exam  HPI  Patient presents for annual CPE.  Diet: eats mostly at home, likes vegetables Exercise: discussed regular activity   Last Eye Exam: up to date  Last Dental Exam: not recently   [place] from 05/28/2021 in [facility]  AUDIT-C Score 0      Depression: Phq 9 is  negative    06/30/2022   12:01 PM 06/03/2022   11:03 AM 05/05/2022   11:46 AM 01/03/2022   11:33 AM 06/28/2021   10:48 AM  Depression screen PHQ 2/9  Decreased Interest 0 0 0 0 0  Down, Depressed, Hopeless 0 0 3 0 0  PHQ - 2 Score 0 0 3 0 0  Altered sleeping 0 0 0    Tired, decreased energy 0 0 0    Change in appetite 0 0 0    Feeling bad or failure about yourself  0 0 0    Trouble concentrating 0 0 1    Moving slowly or fidgety/restless 0 0 0    Suicidal thoughts 0 0 0    PHQ-9 Score 0 0 4    Difficult doing work/chores   Somewhat difficult     Hypertension: BP Readings from Last 3 Encounters:  06/30/22 128/80  06/03/22 132/80  05/05/22 136/78   Obesity: Wt Readings from Last 3 Encounters:  06/30/22 127 lb 11.2 oz (57.9 kg)  06/03/22 127 lb (57.6 kg)  05/05/22 126 lb (57.2 kg)   BMI Readings from Last 3 Encounters:  06/30/22 24.94 kg/m  06/03/22 24.80 kg/m  05/05/22 24.61 kg/m     Vaccines:   RSV: sent to pharmacy  Tdap: sent to pharmacy  Shingrix: sent to pharmacy  Pneumonia: up to date Flu: today  COVID-19: discussed booster    Hep C Screening: 04/22/19 STD testing and prevention (HIV/chl/gon/syphilis): N/A Intimate partner violence: negative screen  Sexual History : married, one partner, no pain during intercourse  Menstrual History/LMP/Abnormal Bleeding: post-menopausal  Discussed importance of follow up if any post-menopausal bleeding: yes  Incontinence Symptoms: negative for symptoms    Breast cancer:  - Last Mammogram: 09/23/21 - BRCA gene screening: N/A  Osteoporosis Prevention : Discussed high calcium and vitamin D supplementation, weight bearing exercises Bone density: 09/23/21 - reviewed with patient   Cervical cancer screening: N/A  Skin cancer: Discussed monitoring for atypical lesions  Colorectal cancer: referral placed in March but not ready to have colonoscopy done yet  - offered cologuard or fit test and she refused it  Lung cancer:  Low Dose CT Chest recommended if Age 35-80 years, 20 pack-year currently smoking OR have quit w/in 15years. Patient does not qualify for screen  - she states not a heavy smoker and even if she qualify she does not want to have the screening  ECG: 04/22/19  Advanced Care Planning: A voluntary discussion about advance care planning including the explanation and discussion of advance directives.  Discussed health care proxy and Living will, and the patient was able to identify a health care proxy as husband.  Patient does not have a living will and power of attorney of health care   Lipids: Lab Results  Component Value Date   CHOL 163 06/28/2021   CHOL 290 (H) 12/11/2020   CHOL 172 02/29/2020   Lab Results  Component Value Date   HDL 50 06/28/2021   HDL 50 12/11/2020   HDL 56 02/29/2020   Lab Results  Component Value Date   LDLCALC 92 06/28/2021   LDLCALC 195 (H) 12/11/2020   LDLCALC 97 02/29/2020   Lab Results  Component Value Date   TRIG 118 06/28/2021   TRIG 250 (H) 12/11/2020   TRIG 97 02/29/2020   Lab Results  Component Value Date   CHOLHDL 3.3 06/28/2021   CHOLHDL 5.8 (H) 12/11/2020   CHOLHDL 3.1 02/29/2020   No results found for: "LDLDIRECT"  Glucose: Glucose  Date Value Ref Range Status  02/20/2014 85 65 - 99 mg/dL Final  10/18/2011 94 65 - 99 mg/dL Final   Glucose, Bld  Date Value Ref Range Status  06/28/2021 79 65 - 99 mg/dL Final    Comment:    .            Fasting reference interval .    12/11/2020 96 65 - 99 mg/dL Final    Comment:    .            Fasting reference interval .   02/29/2020 93 65 - 99 mg/dL Final    Comment:    .            Fasting reference interval .     Patient Active Problem List   Diagnosis Date Noted   Leg pain 04/08/2020   Dyslipidemia 06/29/2019   History of gestational diabetes 02/13/2017   Avitaminosis D 02/15/2016   Obstructive apnea 02/15/2016   Blood glucose elevated 02/15/2016   Abnormal LFTs 02/15/2016   Dermatitis, eczematoid 02/15/2016   Generalized anxiety disorder 10/11/2015   Simple chronic bronchitis ([place]) 10/11/2015   Chronic back pain 10/11/2015   Chronic constipation 10/11/2015   Reflux esophagitis 05/01/2007    Past Surgical History:  Procedure Laterality Date   BREAST EXCISIONAL BIOPSY Left 1996   neg    Family History  Problem Relation Age of Onset   Breast cancer Mother 21   Alzheimer's disease Mother    Kidney disease Mother    Breast cancer Maternal Aunt 76   Breast cancer Paternal Grandmother    Polycythemia Maternal Grandmother     Social History   Socioeconomic History   Marital status: Married    Spouse name: Not on file   Number of children: 7   Years of education: Not on file   Highest education level: Some college, no degree  Occupational History   Occupation: member Administrator, arts   Tobacco Use   Smoking status: Some Days    Packs/day: 0.25    Years: 38.00    Total pack years: 9.50    Types: E-cigarettes, Cigarettes   Smokeless tobacco: Never  Vaping Use   Vaping Use: Some days  Substance and Sexual Activity   Alcohol use: No    Alcohol/week: 0.0 standard drinks of alcohol   Drug use: No   Sexual activity: Yes  Other Topics Concern   Not on file  Social History Narrative   Married , has 7 grown children, working part time   Social Determinants of Radio broadcast assistant Strain: Low Risk  (06/30/2022)   Overall Financial Resource Strain (CARDIA)    Difficulty of Paying  Living Expenses: Not hard at all  Food Insecurity: No Food Insecurity (06/30/2022)   Hunger Vital Sign    Worried About Running Out of Food in the Last Year: Never true    Ran  Out of Food in the Last Year: Never true  Transportation Needs: No Transportation Needs (06/30/2022)   PRAPARE - Hydrologist (Medical): No    Lack of Transportation (Non-Medical): No  Physical Activity: Inactive (06/30/2022)   Exercise Vital Sign    Days of Exercise per Week: 0 days    Minutes of Exercise per Session: 0 min  Stress: No Stress Concern Present (06/30/2022)   [place]    Feeling of Stress : Only a little  Social Connections: Socially Integrated (06/30/2022)   Social Connection and Isolation Panel [NHANES]    Frequency of Communication with Friends and Family: More than three times a week    Frequency of Social Gatherings with Friends and Family: More than three times a week    Attends Religious Services: More than 4 times per year    Active Member of [employer] or Organizations: Yes    Attends Music therapist: More than 4 times per year    Marital Status: Married  Human resources officer Violence: Not At Risk (06/30/2022)   Humiliation, Afraid, Rape, and Kick questionnaire    Fear of Current or Ex-Partner: No    Emotionally Abused: No    Physically Abused: No    Sexually Abused: No     Current Outpatient Medications:    albuterol (VENTOLIN HFA) 108 (90 Base) MCG/ACT inhaler, INHALE 2 PUFFS BY MOUTH EVERY 6 HOURS AS NEEDED FOR WHEEZING OR SHORTNESS OF BREATH, Disp: 18 g, Rfl: 1   ARIPiprazole (ABILIFY) 5 MG tablet, Take 1 tablet (5 mg total) by mouth every evening., Disp: 90 tablet, Rfl: 0   azelastine (ASTELIN) 0.1 % nasal spray, Place 2 sprays into both nostrils 2 (two) times daily. Use in each nostril as directed, Disp: 30 mL, Rfl: 1   busPIRone (BUSPAR) 5 MG tablet, Take 1 tablet (5 mg total) by  mouth 3 (three) times daily., Disp: 270 tablet, Rfl: 1   Cholecalciferol (VITAMIN D) 50 MCG (2000 UT) CAPS, Take 1 capsule (2,000 Units total) by mouth daily., Disp: 30 capsule, Rfl: 0   citalopram (CELEXA) 10 MG tablet, Take 1 tablet (10 mg total) by mouth daily., Disp: 90 tablet, Rfl: 0   clindamycin (CLEOCIN) 300 MG capsule, Take 300 mg by mouth every 6 (six) hours., Disp: , Rfl:    cyclobenzaprine (FLEXERIL) 5 MG tablet, Take 1 tablet (5 mg total) by mouth 3 (three) times daily as needed for muscle spasms., Disp: 90 tablet, Rfl: 1   EQ ALLERGY RELIEF 10 MG tablet, Take 1 tablet by mouth once daily, Disp: 90 tablet, Rfl: 0   fluticasone (FLONASE) 50 MCG/ACT nasal spray, Place 2 sprays into both nostrils daily., Disp: 16 g, Rfl: 1   losartan (COZAAR) 50 MG tablet, Take 1 tablet (50 mg total) by mouth daily., Disp: 90 tablet, Rfl: 1   meclizine (ANTIVERT) 12.5 MG tablet, Take 1 tablet (12.5 mg total) by mouth 3 (three) times daily as needed for dizziness., Disp: 30 tablet, Rfl: 0   montelukast (SINGULAIR) 10 MG tablet, Take 1 tablet (10 mg total) by mouth at bedtime., Disp: 90 tablet, Rfl: 1   rosuvastatin (CRESTOR) 10 MG tablet, Take 1 tablet (10 mg total) by mouth daily., Disp: 90 tablet, Rfl: 1   Tdap (ADACEL) 02-04-14.5 LF-MCG/0.5 injection, Inject 0.5 mLs into the muscle once for 1 dose., Disp: 0.5 mL, Rfl: 0   Zoster Vaccine Adjuvanted (SHINGRIX) injection, Inject 0.5 mLs  into the muscle once for 1 dose., Disp: 0.5 mL, Rfl: 0  Allergies  Allergen Reactions   Penicillins Swelling   Simvastatin    Zoloft [Sertraline Hcl] Other (See Comments)    Shop lift     ROS  Constitutional: Negative for fever or weight change.  Respiratory: Negative for cough and shortness of breath.   Cardiovascular: Negative for chest pain or palpitations.  Gastrointestinal: Negative for abdominal pain, no bowel changes.  Musculoskeletal: Negative for gait problem or joint swelling.  Skin: Negative for rash.   Neurological: Negative for dizziness or headache.  No other specific complaints in a complete review of systems (except as listed in HPI above).   Objective  Vitals:   06/30/22 1150  BP: 128/80  Pulse: 97  Resp: 14  Temp: 98.1 F (36.7 C)  TempSrc: Oral  SpO2: 96%  Weight: 127 lb 11.2 oz (57.9 kg)  Height: 5' (1.524 m)    Body mass index is 24.94 kg/m.  Physical Exam  Constitutional: Patient appears well-developed and well-nourished. No distress.  HENT: Head: Normocephalic and atraumatic. Ears: B TMs ok, no erythema or effusion; Nose: Nose normal. Mouth/Throat: Oropharynx is clear and moist. No oropharyngeal exudate. Missing teeth  Eyes: Conjunctivae and EOM are normal. Pupils are equal, round, and reactive to light. No scleral icterus.  Neck: Normal range of motion. Neck supple. No JVD present. No thyromegaly present.  Cardiovascular: Normal rate, regular rhythm and normal heart sounds.  No murmur heard. No BLE edema. Pulmonary/Chest: Effort normal and breath sounds normal. No respiratory distress. Abdominal: Soft. Bowel sounds are normal, no distension. There is no tenderness. no masses Breast: firm mass on right breast at 12 o'clock and 2 o'clock  FEMALE GENITALIA:  Offered to get it done but she refused  RECTAL:not done  Musculoskeletal: Normal range of motion, no joint effusions. No gross deformities Neurological: he is alert and oriented to person, place, and time. No cranial nerve deficit. Coordination, balance, strength, speech and gait are normal.  Skin: Skin is warm and dry. No rash noted. No erythema.  Psychiatric: Patient has a normal mood and affect. behavior is normal. Judgment and thought content normal.    Fall Risk:    06/30/2022   12:03 PM 06/03/2022   11:08 AM 05/05/2022   11:46 AM 01/03/2022   11:33 AM 06/28/2021   10:48 AM  Fall Risk   Falls in the past year? 0 0 0 0 0  Number falls in past yr:   0  0  Injury with Fall?   0  0  Risk for fall due  to : No Fall Risks No Fall Risks No Fall Risks  No Fall Risks  Follow up Falls prevention discussed;Education provided;Falls evaluation completed Falls prevention discussed Falls prevention discussed Falls prevention discussed Falls prevention discussed     Functional Status Survey: Is the patient deaf or have difficulty hearing?: No Does the patient have difficulty seeing, even when wearing glasses/contacts?: No Does the patient have difficulty concentrating, remembering, or making decisions?: No Does the patient have difficulty walking or climbing stairs?: No Does the patient have difficulty dressing or bathing?: No Does the patient have difficulty doing errands alone such as visiting a doctor's office or shopping?: No   Assessment & Plan  1. Well adult exam  - Flu Vaccine QUAD High Dose(Fluad) - Ambulatory referral to Gastroenterology  2. Need for immunization against influenza  - Flu Vaccine QUAD High Dose(Fluad)  3. Colon cancer screening  -  Ambulatory referral to Gastroenterology  4. Breast cancer screening by mammogram  - MM 3D SCREEN BREAST BILATERAL; Future  5. Need for shingles vaccine  - Zoster Vaccine Adjuvanted [facility]) injection; Inject 0.5 mLs into the muscle once for 1 dose.  Dispense: 0.5 mL; Refill: 0  6. Need for Tdap vaccination  - Tdap (ADACEL) 02-04-14.5 LF-MCG/0.5 injection; Inject 0.5 mLs into the muscle once for 1 dose.  Dispense: 0.5 mL; Refill: 0  7. Need for RSV immunization  - RSV,Recombinant PF (Arexvy)   8. Pre-diabetes   9. Vitamin D deficiency  - VITAMIN D 25 Hydroxy (Vit-D Deficiency, Fractures)  10. Dyslipidemia  - Lipid panel  11. Hypertension, benign  - CBC with Differential/Platelet - COMPLETE METABOLIC PANEL WITH GFR  12. Hyperglycemia  - Hemoglobin A1c  13. Mass of right breast, unspecified quadrant  - US BREAST LTD UNI RIGHT INC AXILLA; Future - MM Digital Diagnostic Unilat R; Future    -USPSTF grade A and  B recommendations reviewed with patient; age-appropriate recommendations, preventive care, screening tests, etc discussed and encouraged; healthy living encouraged; see AVS for patient education given to patient -Discussed importance of 150 minutes of physical activity weekly, eat two servings of fish weekly, eat one serving of tree nuts ( cashews, pistachios, pecans, almonds.Marland Kitchen) every other day, eat 6 servings of fruit/vegetables daily and drink plenty of water and avoid sweet beverages.   -Reviewed Health Maintenance: Yes.

## 2022-06-30 ENCOUNTER — Encounter: Payer: Self-pay | Admitting: Family Medicine

## 2022-06-30 ENCOUNTER — Ambulatory Visit (INDEPENDENT_AMBULATORY_CARE_PROVIDER_SITE_OTHER): Payer: Medicare HMO | Admitting: Family Medicine

## 2022-06-30 VITALS — BP 128/80 | HR 97 | Temp 98.1°F | Resp 14 | Ht 60.0 in | Wt 127.7 lb

## 2022-06-30 DIAGNOSIS — Z2911 Encounter for prophylactic immunotherapy for respiratory syncytial virus (RSV): Secondary | ICD-10-CM | POA: Diagnosis not present

## 2022-06-30 DIAGNOSIS — R739 Hyperglycemia, unspecified: Secondary | ICD-10-CM | POA: Diagnosis not present

## 2022-06-30 DIAGNOSIS — Z1211 Encounter for screening for malignant neoplasm of colon: Secondary | ICD-10-CM

## 2022-06-30 DIAGNOSIS — E559 Vitamin D deficiency, unspecified: Secondary | ICD-10-CM

## 2022-06-30 DIAGNOSIS — Z1231 Encounter for screening mammogram for malignant neoplasm of breast: Secondary | ICD-10-CM

## 2022-06-30 DIAGNOSIS — E785 Hyperlipidemia, unspecified: Secondary | ICD-10-CM | POA: Diagnosis not present

## 2022-06-30 DIAGNOSIS — R7303 Prediabetes: Secondary | ICD-10-CM

## 2022-06-30 DIAGNOSIS — I10 Essential (primary) hypertension: Secondary | ICD-10-CM | POA: Diagnosis not present

## 2022-06-30 DIAGNOSIS — N6310 Unspecified lump in the right breast, unspecified quadrant: Secondary | ICD-10-CM

## 2022-06-30 DIAGNOSIS — Z23 Encounter for immunization: Secondary | ICD-10-CM | POA: Diagnosis not present

## 2022-06-30 DIAGNOSIS — Z0000 Encounter for general adult medical examination without abnormal findings: Secondary | ICD-10-CM | POA: Diagnosis not present

## 2022-06-30 MED ORDER — SHINGRIX 50 MCG/0.5ML IM SUSR: 0.5000 mL | Freq: Once | INTRAMUSCULAR | 0 refills | Status: AC

## 2022-06-30 MED ORDER — TETANUS-DIPHTH-ACELL PERTUSSIS 5-2-15.5 LF-MCG/0.5 IM SUSP: 0.5000 mL | Freq: Once | INTRAMUSCULAR | 0 refills | Status: AC

## 2022-07-01 ENCOUNTER — Other Ambulatory Visit: Payer: Self-pay | Admitting: Family Medicine

## 2022-07-01 DIAGNOSIS — N6310 Unspecified lump in the right breast, unspecified quadrant: Secondary | ICD-10-CM

## 2022-07-01 LAB — COMPLETE METABOLIC PANEL WITH GFR
AG Ratio: 1.5 (calc) (ref 1.0–2.5)
ALT: 27 U/L (ref 6–29)
AST: 21 U/L (ref 10–35)
Albumin: 4.1 g/dL (ref 3.6–5.1)
Alkaline phosphatase (APISO): 87 U/L (ref 37–153)
BUN: 20 mg/dL (ref 7–25)
CO2: 28 mmol/L (ref 20–32)
Calcium: 10.2 mg/dL (ref 8.6–10.4)
Chloride: 103 mmol/L (ref 98–110)
Creat: 0.66 mg/dL (ref 0.50–1.05)
Globulin: 2.8 g/dL (calc) (ref 1.9–3.7)
Glucose, Bld: 74 mg/dL (ref 65–99)
Potassium: 4 mmol/L (ref 3.5–5.3)
Sodium: 140 mmol/L (ref 135–146)
Total Bilirubin: 0.3 mg/dL (ref 0.2–1.2)
Total Protein: 6.9 g/dL (ref 6.1–8.1)
eGFR: 95 mL/min/{1.73_m2} (ref >=60)

## 2022-07-01 LAB — CBC WITH DIFFERENTIAL/PLATELET
Absolute Monocytes: 529 cells/uL (ref 200–950)
Basophils Absolute: 69 cells/uL (ref 0–200)
Basophils Relative: 1.1 %
Eosinophils Absolute: 88 cells/uL (ref 15–500)
Eosinophils Relative: 1.4 %
HCT: 41.6 % (ref 35.0–45.0)
Hemoglobin: 13.5 g/dL (ref 11.7–15.5)
Lymphs Abs: 1481 cells/uL (ref 850–3900)
MCH: 29.1 pg (ref 27.0–33.0)
MCHC: 32.5 g/dL (ref 32.0–36.0)
MCV: 89.7 fL (ref 80.0–100.0)
MPV: 9.7 fL (ref 7.5–12.5)
Monocytes Relative: 8.4 %
Neutro Abs: 4133 cells/uL (ref 1500–7800)
Neutrophils Relative %: 65.6 %
Platelets: 308 10*3/uL (ref 140–400)
RBC: 4.64 10*6/uL (ref 3.80–5.10)
RDW: 13.1 % (ref 11.0–15.0)
Total Lymphocyte: 23.5 %
WBC: 6.3 10*3/uL (ref 3.8–10.8)

## 2022-07-01 LAB — LIPID PANEL
Cholesterol: 173 mg/dL (ref <200)
HDL: 59 mg/dL (ref >=50)
LDL Cholesterol (Calc): 94 mg/dL (calc)
Non-HDL Cholesterol (Calc): 114 mg/dL (calc) (ref <130)
Total CHOL/HDL Ratio: 2.9 (calc) (ref <5.0)
Triglycerides: 104 mg/dL (ref <150)

## 2022-07-01 LAB — HEMOGLOBIN A1C
Hgb A1c MFr Bld: 6 % of total Hgb — ABNORMAL HIGH (ref <5.7)
Mean Plasma Glucose: 126 mg/dL
eAG (mmol/L): 7 mmol/L

## 2022-07-01 LAB — VITAMIN D 25 HYDROXY (VIT D DEFICIENCY, FRACTURES): Vit D, 25-Hydroxy: 45 ng/mL (ref 30–100)

## 2022-07-04 ENCOUNTER — Encounter: Payer: Self-pay | Admitting: Family Medicine

## 2022-07-04 ENCOUNTER — Other Ambulatory Visit: Payer: Self-pay | Admitting: Family Medicine

## 2022-07-04 DIAGNOSIS — E785 Hyperlipidemia, unspecified: Secondary | ICD-10-CM

## 2022-07-04 DIAGNOSIS — F411 Generalized anxiety disorder: Secondary | ICD-10-CM

## 2022-07-04 MED ORDER — BUSPIRONE HCL 5 MG PO TABS: 5.0000 mg | ORAL_TABLET | Freq: Three times a day (TID) | ORAL | 1 refills | Status: DC

## 2022-07-04 MED ORDER — ROSUVASTATIN CALCIUM 20 MG PO TABS: 20.0000 mg | ORAL_TABLET | Freq: Every day | ORAL | 1 refills | Status: DC

## 2022-07-07 DIAGNOSIS — M9901 Segmental and somatic dysfunction of cervical region: Secondary | ICD-10-CM | POA: Diagnosis not present

## 2022-07-07 DIAGNOSIS — M5134 Other intervertebral disc degeneration, thoracic region: Secondary | ICD-10-CM | POA: Diagnosis not present

## 2022-07-07 DIAGNOSIS — R519 Headache, unspecified: Secondary | ICD-10-CM | POA: Diagnosis not present

## 2022-07-07 DIAGNOSIS — M9902 Segmental and somatic dysfunction of thoracic region: Secondary | ICD-10-CM | POA: Diagnosis not present

## 2022-07-16 ENCOUNTER — Ambulatory Visit
Admission: RE | Admit: 2022-07-16 | Discharge: 2022-07-16 | Disposition: A | Discharge status: Home / Self Care | Payer: Medicare HMO | Source: Ambulatory Visit | Attending: Family Medicine | Admitting: Family Medicine

## 2022-07-16 DIAGNOSIS — R922 Inconclusive mammogram: Secondary | ICD-10-CM | POA: Diagnosis not present

## 2022-07-16 DIAGNOSIS — N6310 Unspecified lump in the right breast, unspecified quadrant: Secondary | ICD-10-CM | POA: Insufficient documentation

## 2022-07-16 DIAGNOSIS — N6315 Unspecified lump in the right breast, overlapping quadrants: Secondary | ICD-10-CM | POA: Insufficient documentation

## 2022-07-16 DIAGNOSIS — N6489 Other specified disorders of breast: Secondary | ICD-10-CM | POA: Diagnosis not present

## 2022-07-27 ENCOUNTER — Other Ambulatory Visit: Payer: Self-pay | Admitting: Family Medicine

## 2022-07-27 DIAGNOSIS — F39 Unspecified mood [affective] disorder: Secondary | ICD-10-CM

## 2022-07-28 MED ORDER — ARIPIPRAZOLE 5 MG PO TABS: 5.0000 mg | ORAL_TABLET | Freq: Every evening | ORAL | 0 refills | Status: DC

## 2022-08-04 ENCOUNTER — Encounter (INDEPENDENT_AMBULATORY_CARE_PROVIDER_SITE_OTHER): Payer: Self-pay

## 2022-08-15 DIAGNOSIS — M5134 Other intervertebral disc degeneration, thoracic region: Secondary | ICD-10-CM | POA: Diagnosis not present

## 2022-08-15 DIAGNOSIS — R519 Headache, unspecified: Secondary | ICD-10-CM | POA: Diagnosis not present

## 2022-08-15 DIAGNOSIS — M9902 Segmental and somatic dysfunction of thoracic region: Secondary | ICD-10-CM | POA: Diagnosis not present

## 2022-08-15 DIAGNOSIS — M9901 Segmental and somatic dysfunction of cervical region: Secondary | ICD-10-CM | POA: Diagnosis not present

## 2022-10-27 ENCOUNTER — Other Ambulatory Visit: Payer: Self-pay | Admitting: Family Medicine

## 2022-10-27 DIAGNOSIS — F39 Unspecified mood [affective] disorder: Secondary | ICD-10-CM

## 2022-10-27 MED ORDER — ARIPIPRAZOLE 5 MG PO TABS: 5.0000 mg | ORAL_TABLET | Freq: Every evening | ORAL | 0 refills | Status: DC

## 2022-11-03 NOTE — Progress Notes (Unsigned)
Name: Kristine Lowe   MRN: [id number]    DOB: 08/01/1956   Date:11/04/2022       Progress Note  Subjective  Chief Complaint  Follow Up  HPI  Hematuria: found on urine dip, negative culture , last urine microscopy also positive , previous history of kidney stones found on CT done in 2006, she went back to Urologist 02/2021 , she has small left kidney stone and was advised follow up prn No recent episodes or hematuria noticed   Snoring: she states she snores and has noticed daytime somnolence. ESS done and it is 14 and we set up for sleep study   Mood Disorder: : long history of mood changes, she has tried multiple medications and currently on  Abilify 5 mg and Buspar TID prn . She cannot take Zoloft because it caused her to shoplift in the past . Citalopram did not work in the past    Pre-diabetes: A1C has been in the pre diabetes range for a long time. she gets sleepy after a high carb diet, starts at about 30 minutes after a meal and lasts for about 1.5 hours.  Denies polyphagia, polydipsia or polyuria. She is trying to avoid a lot of carbs She also states upset about her weight, explained her weight has been stable, she does not qualify for medication for weight loss, but explain to her she can try myfitness pal or intermittent fasting   HTN: bp is at goal, no chest pain, palpitation, she has SOB when leaning over - she states due her stomach being big  Dyslipidemia/Atherosclerosis of aorta : Last LDL was very high still at 195  she is doing well on crestor, last LDL down to 94   Vitamin D deficiency: continue otc supplementation   Chronic bronchitis:  She smokes cigarettes occasionally but vapes on a regular basis  She denies a cough, no wheezing or sob at this time She also takes singulair since she has RAD, she states humidity causes some SOB and usually uses Ventolin during Summer months   Back pain with intermittent muscle spasms: taking flexeril prn only  Vertigo: very  seldom , takes meclizine prn only, last fill was 2020  Lesion index finger: she was working on her yard last Summer and felt something when taking a plant out of a pot, it is not sore but there is something under the skin . Offered to remove it here but she prefers seeing dermatologist    Patient Active Problem List   Diagnosis Date Noted   Leg pain 04/08/2020   Dyslipidemia 06/29/2019   History of gestational diabetes 02/13/2017   Avitaminosis D 02/15/2016   Obstructive apnea 02/15/2016   Blood glucose elevated 02/15/2016   Abnormal LFTs 02/15/2016   Dermatitis, eczematoid 02/15/2016   Generalized anxiety disorder 10/11/2015   Simple chronic bronchitis ([place]) 10/11/2015   Chronic back pain 10/11/2015   Chronic constipation 10/11/2015   Reflux esophagitis 05/01/2007    Past Surgical History:  Procedure Laterality Date   BREAST EXCISIONAL BIOPSY Left 1996   neg    Family History  Problem Relation Age of Onset   Breast cancer Mother 73   Alzheimer's disease Mother    Kidney disease Mother    Breast cancer Maternal Aunt 60   Breast cancer Paternal Grandmother    Polycythemia Maternal Grandmother     Social History   Tobacco Use   Smoking status: Some Days    Packs/day: 0.25    Years: 38.00  Total pack years: 9.50    Types: E-cigarettes, Cigarettes   Smokeless tobacco: Never  Substance Use Topics   Alcohol use: No    Alcohol/week: 0.0 standard drinks of alcohol     Current Outpatient Medications:    albuterol (VENTOLIN HFA) 108 (90 Base) MCG/ACT inhaler, INHALE 2 PUFFS BY MOUTH EVERY 6 HOURS AS NEEDED FOR WHEEZING OR SHORTNESS OF BREATH, Disp: 18 g, Rfl: 1   ARIPiprazole (ABILIFY) 5 MG tablet, Take 1 tablet (5 mg total) by mouth every evening., Disp: 90 tablet, Rfl: 0   azelastine (ASTELIN) 0.1 % nasal spray, Place 2 sprays into both nostrils 2 (two) times daily. Use in each nostril as directed, Disp: 30 mL, Rfl: 1   busPIRone (BUSPAR) 5 MG tablet, Take 1 tablet  (5 mg total) by mouth 3 (three) times daily., Disp: 270 tablet, Rfl: 1   Cholecalciferol (VITAMIN D) 50 MCG (2000 UT) CAPS, Take 1 capsule (2,000 Units total) by mouth daily., Disp: 30 capsule, Rfl: 0   cyclobenzaprine (FLEXERIL) 5 MG tablet, Take 1 tablet (5 mg total) by mouth 3 (three) times daily as needed for muscle spasms., Disp: 90 tablet, Rfl: 1   EQ ALLERGY RELIEF 10 MG tablet, Take 1 tablet by mouth once daily, Disp: 90 tablet, Rfl: 0   fluticasone (FLONASE) 50 MCG/ACT nasal spray, Place 2 sprays into both nostrils daily., Disp: 16 g, Rfl: 1   losartan (COZAAR) 50 MG tablet, Take 1 tablet (50 mg total) by mouth daily., Disp: 90 tablet, Rfl: 1   meclizine (ANTIVERT) 12.5 MG tablet, Take 1 tablet (12.5 mg total) by mouth 3 (three) times daily as needed for dizziness., Disp: 30 tablet, Rfl: 0   montelukast (SINGULAIR) 10 MG tablet, Take 1 tablet (10 mg total) by mouth at bedtime., Disp: 90 tablet, Rfl: 1   rosuvastatin (CRESTOR) 20 MG tablet, Take 1 tablet (20 mg total) by mouth daily., Disp: 90 tablet, Rfl: 1  Allergies  Allergen Reactions   Penicillins Swelling   Simvastatin    Zoloft [Sertraline Hcl] Other (See Comments)    Shop lift    I personally reviewed active problem list, medication list, allergies, family history, social history, health maintenance with the patient/caregiver today.   ROS  Constitutional: Negative for fever or weight change.  Respiratory: Negative for cough and shortness of breath.   Cardiovascular: Negative for chest pain or palpitations.  Gastrointestinal: Negative for abdominal pain, no bowel changes.  Musculoskeletal: Negative for gait problem or joint swelling.  Skin: Negative for rash.  Neurological: Negative for dizziness or headache.  No other specific complaints in a complete review of systems (except as listed in HPI above).   Objective  Vitals:   11/04/22 1135  BP: 132/74  Pulse: 76  Resp: 16  Weight: 132 lb (59.9 kg)  Height: 5'  (1.524 m)    Body mass index is 25.78 kg/m.  Physical Exam  Constitutional: Patient appears well-developed and well-nourished. No distress.  HEENT: head atraumatic, normocephalic, pupils equal and reactive to light, neck supple Cardiovascular: Normal rate, regular rhythm and normal heart sounds.  No murmur heard. No BLE edema. Pulmonary/Chest: Effort normal and breath sounds normal. No respiratory distress. Abdominal: Soft.  There is no tenderness. Psychiatric: Patient has a normal mood and affect. behavior is normal. Judgment and thought content normal.    PHQ2/9:    11/04/2022   11:34 AM 06/30/2022   12:01 PM 06/03/2022   11:03 AM 05/05/2022   11:46 AM 01/03/2022  11:33 AM  Depression screen PHQ 2/9  Decreased Interest 0 0 0 0 0  Down, Depressed, Hopeless 0 0 0 3 0  PHQ - 2 Score 0 0 0 3 0  Altered sleeping 0 0 0 0   Tired, decreased energy 0 0 0 0   Change in appetite 0 0 0 0   Feeling bad or failure about yourself  0 0 0 0   Trouble concentrating 0 0 0 1   Moving slowly or fidgety/restless 0 0 0 0   Suicidal thoughts 0 0 0 0   PHQ-9 Score 0 0 0 4   Difficult doing work/chores    Somewhat difficult     phq 9 is negative   Fall Risk:    11/04/2022   11:34 AM 06/30/2022   12:03 PM 06/03/2022   11:08 AM 05/05/2022   11:46 AM 01/03/2022   11:33 AM  Fall Risk   Falls in the past year? 0 0 0 0 0  Number falls in past yr: 0   0   Injury with Fall? 0   0   Risk for fall due to : No Fall Risks No Fall Risks No Fall Risks No Fall Risks   Follow up Falls prevention discussed Falls prevention discussed;Education provided;Falls evaluation completed Falls prevention discussed Falls prevention discussed Falls prevention discussed      Functional Status Survey: Is the patient deaf or have difficulty hearing?: No Does the patient have difficulty seeing, even when wearing glasses/contacts?: No Does the patient have difficulty concentrating, remembering, or making decisions?:  No Does the patient have difficulty walking or climbing stairs?: No Does the patient have difficulty dressing or bathing?: No Does the patient have difficulty doing errands alone such as visiting a doctor's office or shopping?: No    Assessment & Plan  1. Simple chronic bronchitis (HCC)  Still vaping, symptoms stable   2. Mood disorder (HCC)  - ARIPiprazole (ABILIFY) 5 MG tablet; Take 1 tablet (5 mg total) by mouth every evening.  Dispense: 90 tablet; Refill: 1  3. Hypertension, benign  - losartan (COZAAR) 50 MG tablet; Take 1 tablet (50 mg total) by mouth daily.  Dispense: 90 tablet; Refill: 1 - Ambulatory referral to Sleep Studies  4. Vitamin D deficiency  Continue vitamin D supplementation   5. Dyslipidemia  - rosuvastatin (CRESTOR) 20 MG tablet; Take 1 tablet (20 mg total) by mouth daily.  Dispense: 90 tablet; Refill: 1  6. GAD (generalized anxiety disorder)  stable  7. Hyperglycemia  Reminded her of low carb diet  8. Seasonal allergic rhinitis due to pollen  - montelukast (SINGULAIR) 10 MG tablet; Take 1 tablet (10 mg total) by mouth at bedtime.  Dispense: 90 tablet; Refill: 1  9. Muscle spasm of back - cyclobenzaprine (FLEXERIL) 5 MG tablet; Take 1 tablet (5 mg total) by mouth 3 (three) times daily as needed for muscle spasms.  Dispense: 90 tablet; Refill: 0  10. Snoring  - Ambulatory referral to Sleep Studies  11. Daytime somnolence  - Ambulatory referral to Sleep Studies  12. Breast cancer screening by mammogram  - MM 3D SCREEN BREAST BILATERAL; Future  13. Intermittent vertigo  - meclizine (ANTIVERT) 12.5 MG tablet; Take 1 tablet (12.5 mg total) by mouth 3 (three) times daily as needed for dizziness.  Dispense: 30 tablet; Refill: 0  14. Colon cancer screening  - Fecal Globin By Immunochemistry  15. Abnormal mammogram  - US BREAST LTD UNI RIGHT INC AXILLA; Future  16. Atherosclerosis of aorta ([place])   On statin therapy

## 2022-11-04 ENCOUNTER — Encounter: Payer: Self-pay | Admitting: Family Medicine

## 2022-11-04 ENCOUNTER — Ambulatory Visit (INDEPENDENT_AMBULATORY_CARE_PROVIDER_SITE_OTHER): Payer: Medicare HMO | Admitting: Family Medicine

## 2022-11-04 VITALS — BP 132/74 | HR 76 | Temp 97.9°F | Resp 16 | Ht 60.0 in | Wt 132.0 lb

## 2022-11-04 DIAGNOSIS — R400 Somnolence: Secondary | ICD-10-CM

## 2022-11-04 DIAGNOSIS — J410 Simple chronic bronchitis: Secondary | ICD-10-CM

## 2022-11-04 DIAGNOSIS — F411 Generalized anxiety disorder: Secondary | ICD-10-CM

## 2022-11-04 DIAGNOSIS — R739 Hyperglycemia, unspecified: Secondary | ICD-10-CM

## 2022-11-04 DIAGNOSIS — M62830 Muscle spasm of back: Secondary | ICD-10-CM

## 2022-11-04 DIAGNOSIS — Z1211 Encounter for screening for malignant neoplasm of colon: Secondary | ICD-10-CM

## 2022-11-04 DIAGNOSIS — E785 Hyperlipidemia, unspecified: Secondary | ICD-10-CM

## 2022-11-04 DIAGNOSIS — J301 Allergic rhinitis due to pollen: Secondary | ICD-10-CM

## 2022-11-04 DIAGNOSIS — F39 Unspecified mood [affective] disorder: Secondary | ICD-10-CM | POA: Diagnosis not present

## 2022-11-04 DIAGNOSIS — I700 Atherosclerosis of aorta: Secondary | ICD-10-CM

## 2022-11-04 DIAGNOSIS — I10 Essential (primary) hypertension: Secondary | ICD-10-CM | POA: Diagnosis not present

## 2022-11-04 DIAGNOSIS — E559 Vitamin D deficiency, unspecified: Secondary | ICD-10-CM

## 2022-11-04 DIAGNOSIS — R42 Dizziness and giddiness: Secondary | ICD-10-CM

## 2022-11-04 DIAGNOSIS — Z1231 Encounter for screening mammogram for malignant neoplasm of breast: Secondary | ICD-10-CM

## 2022-11-04 DIAGNOSIS — R0683 Snoring: Secondary | ICD-10-CM

## 2022-11-04 DIAGNOSIS — S60459D Superficial foreign body of unspecified finger, subsequent encounter: Secondary | ICD-10-CM

## 2022-11-04 DIAGNOSIS — R928 Other abnormal and inconclusive findings on diagnostic imaging of breast: Secondary | ICD-10-CM

## 2022-11-04 MED ORDER — CYCLOBENZAPRINE HCL 5 MG PO TABS: 5.0000 mg | ORAL_TABLET | Freq: Three times a day (TID) | ORAL | 0 refills | Status: DC | PRN

## 2022-11-04 MED ORDER — MECLIZINE HCL 12.5 MG PO TABS: 12.5000 mg | ORAL_TABLET | Freq: Three times a day (TID) | ORAL | 0 refills | Status: AC | PRN

## 2022-11-04 MED ORDER — LOSARTAN POTASSIUM 50 MG PO TABS: 50.0000 mg | ORAL_TABLET | Freq: Every day | ORAL | 1 refills | Status: DC

## 2022-11-04 MED ORDER — ARIPIPRAZOLE 5 MG PO TABS: 5.0000 mg | ORAL_TABLET | Freq: Every evening | ORAL | 1 refills | Status: DC

## 2022-11-04 MED ORDER — MONTELUKAST SODIUM 10 MG PO TABS: 10.0000 mg | ORAL_TABLET | Freq: Every day | ORAL | 1 refills | Status: DC

## 2022-11-04 MED ORDER — ROSUVASTATIN CALCIUM 20 MG PO TABS: 20.0000 mg | ORAL_TABLET | Freq: Every day | ORAL | 1 refills | Status: DC

## 2022-11-04 NOTE — Addendum Note (Signed)
Addended by: Steele Sizer F on: 11/04/2022 12:49 PM   Modules accepted: Orders

## 2022-11-04 NOTE — Patient Instructions (Signed)
Shingles and Tdap vaccines get it at the local pharmacy

## 2022-11-26 DIAGNOSIS — B078 Other viral warts: Secondary | ICD-10-CM | POA: Diagnosis not present

## 2023-01-09 ENCOUNTER — Telehealth: Payer: Self-pay | Admitting: Family Medicine

## 2023-01-09 NOTE — Telephone Encounter (Signed)
Carollee Herter Calling from [facility] calling to report that they have reached out to the patient since To retreive the device from the home sleep study with no response. Pt has had the device since February 2024. No credit card was taken. Carollee Herter is calling to see if the practice can get a hold of the patient. And then advise with Francis Gaines- [phone]

## 2023-01-10 DIAGNOSIS — M5134 Other intervertebral disc degeneration, thoracic region: Secondary | ICD-10-CM | POA: Diagnosis not present

## 2023-01-10 DIAGNOSIS — M9901 Segmental and somatic dysfunction of cervical region: Secondary | ICD-10-CM | POA: Diagnosis not present

## 2023-01-10 DIAGNOSIS — R519 Headache, unspecified: Secondary | ICD-10-CM | POA: Diagnosis not present

## 2023-01-10 DIAGNOSIS — M9902 Segmental and somatic dysfunction of thoracic region: Secondary | ICD-10-CM | POA: Diagnosis not present

## 2023-01-12 NOTE — Telephone Encounter (Signed)
Spoke with Eber Jones and reminded her to return the device to [facility]. She agreed and number was provided to [facility]. Tamera Stands and let her know patient was contacted.

## 2023-01-12 NOTE — Telephone Encounter (Signed)
Called and lvm informing patient

## 2023-01-20 NOTE — Telephone Encounter (Signed)
Left message for patient. Returned call to Sehili and let her know a message was left for the patient and to reach out to her directly again if she does not hear back.

## 2023-01-20 NOTE — Telephone Encounter (Signed)
Kristine Lowe from [facility] calling in  again checking on status of return of sleep study equipment

## 2023-01-22 ENCOUNTER — Ambulatory Visit
Admission: RE | Admit: 2023-01-22 | Discharge: 2023-01-22 | Disposition: A | Discharge status: Home / Self Care | Payer: Medicare HMO | Source: Ambulatory Visit | Attending: Family Medicine | Admitting: Family Medicine

## 2023-01-22 DIAGNOSIS — R928 Other abnormal and inconclusive findings on diagnostic imaging of breast: Secondary | ICD-10-CM | POA: Insufficient documentation

## 2023-01-22 DIAGNOSIS — N6452 Nipple discharge: Secondary | ICD-10-CM | POA: Diagnosis not present

## 2023-01-23 ENCOUNTER — Other Ambulatory Visit: Payer: Self-pay

## 2023-01-23 DIAGNOSIS — R928 Other abnormal and inconclusive findings on diagnostic imaging of breast: Secondary | ICD-10-CM

## 2023-03-10 DIAGNOSIS — H524 Presbyopia: Secondary | ICD-10-CM | POA: Diagnosis not present

## 2023-03-20 ENCOUNTER — Encounter: Payer: Self-pay | Admitting: Family Medicine

## 2023-03-20 ENCOUNTER — Other Ambulatory Visit: Payer: Self-pay | Admitting: Family Medicine

## 2023-03-20 DIAGNOSIS — R928 Other abnormal and inconclusive findings on diagnostic imaging of breast: Secondary | ICD-10-CM

## 2023-03-30 ENCOUNTER — Ambulatory Visit: Payer: Medicare HMO

## 2023-04-02 ENCOUNTER — Ambulatory Visit
Admission: RE | Admit: 2023-04-02 | Discharge: 2023-04-02 | Disposition: A | Discharge status: Home / Self Care | Payer: Medicare HMO | Source: Ambulatory Visit | Attending: Family Medicine | Admitting: Family Medicine

## 2023-04-02 DIAGNOSIS — Z1239 Encounter for other screening for malignant neoplasm of breast: Secondary | ICD-10-CM | POA: Diagnosis not present

## 2023-04-02 DIAGNOSIS — R928 Other abnormal and inconclusive findings on diagnostic imaging of breast: Secondary | ICD-10-CM | POA: Diagnosis not present

## 2023-04-02 DIAGNOSIS — N6310 Unspecified lump in the right breast, unspecified quadrant: Secondary | ICD-10-CM | POA: Diagnosis not present

## 2023-04-02 DIAGNOSIS — R9230 Dense breasts, unspecified: Secondary | ICD-10-CM | POA: Diagnosis not present

## 2023-04-02 DIAGNOSIS — N6452 Nipple discharge: Secondary | ICD-10-CM | POA: Diagnosis not present

## 2023-04-02 MED ORDER — GADOBUTROL 1 MMOL/ML IV SOLN
6.0000 mL | Freq: Once | INTRAVENOUS | Status: AC | PRN
  Administered 2023-04-02: 6 mL via INTRAVENOUS

## 2023-04-03 ENCOUNTER — Other Ambulatory Visit: Payer: Self-pay

## 2023-04-03 ENCOUNTER — Other Ambulatory Visit: Payer: Self-pay | Admitting: Family Medicine

## 2023-04-03 DIAGNOSIS — N63 Unspecified lump in unspecified breast: Secondary | ICD-10-CM

## 2023-04-03 DIAGNOSIS — R928 Other abnormal and inconclusive findings on diagnostic imaging of breast: Secondary | ICD-10-CM

## 2023-04-06 ENCOUNTER — Other Ambulatory Visit: Payer: Self-pay

## 2023-04-06 DIAGNOSIS — R928 Other abnormal and inconclusive findings on diagnostic imaging of breast: Secondary | ICD-10-CM

## 2023-04-15 ENCOUNTER — Ambulatory Visit
Admission: RE | Admit: 2023-04-15 | Discharge: 2023-04-15 | Disposition: A | Discharge status: Home / Self Care | Payer: Medicare HMO | Source: Ambulatory Visit | Attending: Family Medicine | Admitting: Family Medicine

## 2023-04-15 DIAGNOSIS — N63 Unspecified lump in unspecified breast: Secondary | ICD-10-CM | POA: Insufficient documentation

## 2023-04-15 DIAGNOSIS — R928 Other abnormal and inconclusive findings on diagnostic imaging of breast: Secondary | ICD-10-CM | POA: Insufficient documentation

## 2023-04-15 DIAGNOSIS — N6315 Unspecified lump in the right breast, overlapping quadrants: Secondary | ICD-10-CM | POA: Diagnosis not present

## 2023-04-15 DIAGNOSIS — D241 Benign neoplasm of right breast: Secondary | ICD-10-CM | POA: Diagnosis not present

## 2023-04-15 HISTORY — PX: BREAST BIOPSY: SHX20

## 2023-04-15 MED ORDER — LIDOCAINE HCL 1 % IJ SOLN
2.0000 mL | Freq: Once | INTRAMUSCULAR | Status: AC
  Administered 2023-04-15: 2 mL via INTRADERMAL
  Filled 2023-04-15: qty 2

## 2023-04-15 MED ORDER — LIDOCAINE-EPINEPHRINE 1 %-1:100000 IJ SOLN
5.0000 mL | Freq: Once | INTRAMUSCULAR | Status: AC
  Administered 2023-04-15: 5 mL
  Filled 2023-04-15: qty 5

## 2023-04-27 NOTE — Progress Notes (Unsigned)
Name: Kristine Lowe   MRN: [id number]    DOB: August 26, 1956   Date:04/28/2023       Progress Note  Subjective  Chief Complaint  Follow Up  HPI  Hematuria: found on urine dip, negative culture , last urine microscopy also positive , previous history of kidney stones found on CT done in 2006, she went back to Urologist 02/2021 , she has small left kidney stone and was advised follow up prn . She denies macroscopic hematuria or dysuria  Vulva irritation: she states she has intermittent dry spot on vulva mount, using lotion, no bleeding, advised to come back for pelvic exam , may try hydrocortisone otc   Snoring: she states she snores and has noticed daytime somnolence. ESS done and it is 14 and we sent order for sleep study back in Feb.   Mood Disorder:  long history of mood changes, she has tried multiple medications and currently on  Abilify 5 mg and Buspar mostly BID , sometimes able to skip days  . She cannot take Zoloft because it caused her to shoplift in the past . Citalopram did not work in the past    Pre-diabetes: A1C has been in the pre diabetes range for a long time. she gets sleepy after a high carb diet, starts at about 30 minutes after a meal and lasts for about 1.5 hours.  Denies polyphagia, polydipsia or polyuria. She is trying to avoid a lot of carbs   HTN: bp is at goal, no chest pain, palpitation, she has SOB when leaning over   Dyslipidemia/Atherosclerosis of aorta : Last LDL was very high still at 195  she is doing well on crestor, last LDL down to 94, currently taking 20 mg. We will recheck labs today   Vitamin D deficiency: continue otc supplementation   Chronic bronchitis:  She smokes cigarettes occasionally  and cutting down on vaping also.   She denies a cough, no wheezing or sob at this time She also takes singulair since she has RAD, she states humidity causes some SOB. She uses Ventolin prn   Back pain with intermittent muscle spasms: taking flexeril prn  only, unchanged   Vertigo: very seldom , takes meclizine prn only, last fill was 2020. BP is low, advised to check bp at home and if remains below 120 we will decrease dose of losartan to 25 mg daily   Lesion index finger: she was seen by dermatologist but decided not to have the lesion frozen, diagnosed with a wart   Patient Active Problem List   Diagnosis Date Noted   Mood disorder (HCC) 11/04/2022   Atherosclerosis of aorta (HCC) 11/04/2022   Hypertension, benign 11/04/2022   Leg pain 04/08/2020   Dyslipidemia 06/29/2019   History of gestational diabetes 02/13/2017   Vitamin D deficiency 02/15/2016   Obstructive apnea 02/15/2016   Hyperglycemia 02/15/2016   Abnormal LFTs 02/15/2016   Dermatitis, eczematoid 02/15/2016   Generalized anxiety disorder 10/11/2015   Simple chronic bronchitis (HCC) 10/11/2015   Chronic back pain 10/11/2015   Chronic constipation 10/11/2015   Reflux esophagitis 05/01/2007    Past Surgical History:  Procedure Laterality Date   BREAST BIOPSY Right 04/15/2023   u/s guided bx, 12:00, VENUS clip-path pending   BREAST BIOPSY Right 04/15/2023   [place] RT BREAST BX W LOC DEV 1ST LESION IMG BX SPEC US GUIDE 04/15/2023 [facility]   BREAST EXCISIONAL BIOPSY Left 1996   neg    Family History  Problem Relation Age of  Onset   Breast cancer Mother 49   Alzheimer's disease Mother    Kidney disease Mother    Breast cancer Maternal Aunt 63   Breast cancer Paternal Grandmother    Polycythemia Maternal Grandmother     Social History   Tobacco Use   Smoking status: Some Days    Current packs/day: 0.25    Average packs/day: 0.3 packs/day for 38.0 years (9.5 ttl pk-yrs)    Types: E-cigarettes, Cigarettes   Smokeless tobacco: Never  Substance Use Topics   Alcohol use: No    Alcohol/week: 0.0 standard drinks of alcohol     Current Outpatient Medications:    albuterol (VENTOLIN HFA) 108 (90 Base) MCG/ACT inhaler, INHALE 2 PUFFS BY MOUTH EVERY 6 HOURS AS  NEEDED FOR WHEEZING OR SHORTNESS OF BREATH, Disp: 18 g, Rfl: 1   ARIPiprazole (ABILIFY) 5 MG tablet, Take 1 tablet (5 mg total) by mouth every evening., Disp: 90 tablet, Rfl: 1   azelastine (ASTELIN) 0.1 % nasal spray, Place 2 sprays into both nostrils 2 (two) times daily. Use in each nostril as directed, Disp: 30 mL, Rfl: 1   busPIRone (BUSPAR) 5 MG tablet, Take 1 tablet (5 mg total) by mouth 3 (three) times daily., Disp: 270 tablet, Rfl: 1   Cholecalciferol (VITAMIN D) 50 MCG (2000 UT) CAPS, Take 1 capsule (2,000 Units total) by mouth daily., Disp: 30 capsule, Rfl: 0   cyclobenzaprine (FLEXERIL) 5 MG tablet, Take 1 tablet (5 mg total) by mouth 3 (three) times daily as needed for muscle spasms., Disp: 90 tablet, Rfl: 0   EQ ALLERGY RELIEF 10 MG tablet, Take 1 tablet by mouth once daily, Disp: 90 tablet, Rfl: 0   fluticasone (FLONASE) 50 MCG/ACT nasal spray, Place 2 sprays into both nostrils daily., Disp: 16 g, Rfl: 1   losartan (COZAAR) 50 MG tablet, Take 1 tablet (50 mg total) by mouth daily., Disp: 90 tablet, Rfl: 1   meclizine (ANTIVERT) 12.5 MG tablet, Take 1 tablet (12.5 mg total) by mouth 3 (three) times daily as needed for dizziness., Disp: 30 tablet, Rfl: 0   montelukast (SINGULAIR) 10 MG tablet, Take 1 tablet (10 mg total) by mouth at bedtime., Disp: 90 tablet, Rfl: 1   rosuvastatin (CRESTOR) 20 MG tablet, Take 1 tablet (20 mg total) by mouth daily., Disp: 90 tablet, Rfl: 1  Allergies  Allergen Reactions   Penicillins Swelling   Simvastatin    Zoloft [Sertraline Hcl] Other (See Comments)    Shop lift    I personally reviewed active problem list, medication list, allergies, family history, social history, health maintenance with the patient/caregiver today.   ROS  Ten systems reviewed and is negative except as mentioned in HPI    Objective  Vitals:   04/28/23 1137  BP: 116/64  Pulse: 94  Resp: 12  Temp: 97.8 F (36.6 C)  TempSrc: Oral  SpO2: 100%  Weight: 131 lb (59.4  kg)  Height: 5\' 1"  (1.549 m)    Body mass index is 24.75 kg/m.  Physical Exam  Constitutional: Patient appears well-developed and well-nourished. No distress.  HEENT: head atraumatic, normocephalic, pupils equal and reactive to light,, neck supple Cardiovascular: Normal rate, regular rhythm and normal heart sounds.  No murmur heard. No BLE edema. Pulmonary/Chest: Effort normal and breath sounds normal. No respiratory distress. Abdominal: Soft.  There is no tenderness. Psychiatric: Patient has a normal mood and affect. behavior is normal. Judgment and thought content normal.    PHQ2/9:    04/28/2023  11:38 AM 11/04/2022   11:34 AM 06/30/2022   12:01 PM 06/03/2022   11:03 AM 05/05/2022   11:46 AM  Depression screen PHQ 2/9  Decreased Interest 0 0 0 0 0  Down, Depressed, Hopeless 0 0 0 0 3  PHQ - 2 Score 0 0 0 0 3  Altered sleeping 0 0 0 0 0  Tired, decreased energy 0 0 0 0 0  Change in appetite 0 0 0 0 0  Feeling bad or failure about yourself  0 0 0 0 0  Trouble concentrating 0 0 0 0 1  Moving slowly or fidgety/restless 0 0 0 0 0  Suicidal thoughts 0 0 0 0 0  PHQ-9 Score 0 0 0 0 4  Difficult doing work/chores     Somewhat difficult    phq 9 is negative   Fall Risk:    04/28/2023   11:38 AM 11/04/2022   11:34 AM 06/30/2022   12:03 PM 06/03/2022   11:08 AM 05/05/2022   11:46 AM  Fall Risk   Falls in the past year? 0 0 0 0 0  Number falls in past yr:  0   0  Injury with Fall?  0   0  Risk for fall due to : No Fall Risks No Fall Risks No Fall Risks No Fall Risks No Fall Risks  Follow up Falls prevention discussed Falls prevention discussed Falls prevention discussed;Education provided;Falls evaluation completed Falls prevention discussed Falls prevention discussed      Functional Status Survey: Is the patient deaf or have difficulty hearing?: No Does the patient have difficulty seeing, even when wearing glasses/contacts?: No Does the patient have difficulty  concentrating, remembering, or making decisions?: No Does the patient have difficulty walking or climbing stairs?: No Does the patient have difficulty dressing or bathing?: No Does the patient have difficulty doing errands alone such as visiting a doctor's office or shopping?: No    Assessment & Plan  1. Abnormal mammogram  We will schedule her for MRI   2. Atherosclerosis of aorta (HCC)  - Lipid panel  3. Mood disorder (HCC)  - ARIPiprazole (ABILIFY) 5 MG tablet; Take 1 tablet (5 mg total) by mouth every evening.  Dispense: 90 tablet; Refill: 1  4. Vitamin D deficiency  Continue supplementation   5. Hyperglycemia  - Hemoglobin A1c  6. Hypertension, benign  - CBC with Differential/Platelet - COMPLETE METABOLIC PANEL WITH GFR  7. Simple chronic bronchitis (HCC)  Doing well , cutting down on cigarettes and vaping   8. GAD (generalized anxiety disorder)  - busPIRone (BUSPAR) 5 MG tablet; Take 1 tablet (5 mg total) by mouth 2 (two) times daily as needed.  Dispense: 180 tablet; Refill: 1  9. Muscle spasm of back  - cyclobenzaprine (FLEXERIL) 5 MG tablet; Take 1 tablet (5 mg total) by mouth 3 (three) times daily as needed for muscle spasms.  Dispense: 90 tablet; Refill: 0  10. Dyslipidemia  - rosuvastatin (CRESTOR) 20 MG tablet; Take 1 tablet (20 mg total) by mouth daily.  Dispense: 90 tablet; Refill: 1

## 2023-04-28 ENCOUNTER — Other Ambulatory Visit: Payer: Self-pay | Admitting: Family Medicine

## 2023-04-28 ENCOUNTER — Other Ambulatory Visit: Payer: Self-pay

## 2023-04-28 ENCOUNTER — Ambulatory Visit (INDEPENDENT_AMBULATORY_CARE_PROVIDER_SITE_OTHER): Payer: Medicare HMO | Admitting: Family Medicine

## 2023-04-28 ENCOUNTER — Encounter: Payer: Self-pay | Admitting: Family Medicine

## 2023-04-28 VITALS — BP 116/64 | HR 94 | Temp 97.8°F | Resp 12 | Ht 61.0 in | Wt 131.0 lb

## 2023-04-28 DIAGNOSIS — I10 Essential (primary) hypertension: Secondary | ICD-10-CM | POA: Diagnosis not present

## 2023-04-28 DIAGNOSIS — E559 Vitamin D deficiency, unspecified: Secondary | ICD-10-CM | POA: Diagnosis not present

## 2023-04-28 DIAGNOSIS — E785 Hyperlipidemia, unspecified: Secondary | ICD-10-CM

## 2023-04-28 DIAGNOSIS — F39 Unspecified mood [affective] disorder: Secondary | ICD-10-CM | POA: Diagnosis not present

## 2023-04-28 DIAGNOSIS — I700 Atherosclerosis of aorta: Secondary | ICD-10-CM

## 2023-04-28 DIAGNOSIS — M62830 Muscle spasm of back: Secondary | ICD-10-CM

## 2023-04-28 DIAGNOSIS — R739 Hyperglycemia, unspecified: Secondary | ICD-10-CM | POA: Diagnosis not present

## 2023-04-28 DIAGNOSIS — F411 Generalized anxiety disorder: Secondary | ICD-10-CM

## 2023-04-28 DIAGNOSIS — R928 Other abnormal and inconclusive findings on diagnostic imaging of breast: Secondary | ICD-10-CM

## 2023-04-28 DIAGNOSIS — J410 Simple chronic bronchitis: Secondary | ICD-10-CM

## 2023-04-28 LAB — CBC WITH DIFFERENTIAL/PLATELET
Absolute Monocytes: 410 cells/uL (ref 200–950)
Basophils Relative: 0.9 %
HCT: 41.3 % (ref 35.0–45.0)
MCH: 27.9 pg (ref 27.0–33.0)
MCHC: 31.7 g/dL — ABNORMAL LOW (ref 32.0–36.0)
MPV: 10.8 fL (ref 7.5–12.5)
Monocytes Relative: 6.4 %
Neutro Abs: 4525 cells/uL (ref 1500–7800)
Platelets: 280 10*3/uL (ref 140–400)
RBC: 4.7 10*6/uL (ref 3.80–5.10)
Total Lymphocyte: 21.1 %
WBC: 6.4 10*3/uL (ref 3.8–10.8)

## 2023-04-28 MED ORDER — BUSPIRONE HCL 5 MG PO TABS
5.0000 mg | ORAL_TABLET | Freq: Two times a day (BID) | ORAL | 1 refills | Status: DC | PRN
Start: 2023-04-28 — End: 2023-10-05

## 2023-04-28 MED ORDER — ROSUVASTATIN CALCIUM 20 MG PO TABS
20.0000 mg | ORAL_TABLET | Freq: Every day | ORAL | 1 refills | Status: DC
Start: 2023-04-28 — End: 2023-10-05

## 2023-04-28 MED ORDER — CYCLOBENZAPRINE HCL 5 MG PO TABS
5.0000 mg | ORAL_TABLET | Freq: Three times a day (TID) | ORAL | 0 refills | Status: DC | PRN
Start: 2023-04-28 — End: 2023-10-05

## 2023-04-28 MED ORDER — ARIPIPRAZOLE 5 MG PO TABS
5.0000 mg | ORAL_TABLET | Freq: Every evening | ORAL | 1 refills | Status: DC
Start: 2023-04-28 — End: 2023-10-05

## 2023-04-29 LAB — LIPID PANEL
Cholesterol: 139 mg/dL (ref <200)
HDL: 47 mg/dL — ABNORMAL LOW (ref >=50)
LDL Cholesterol (Calc): 67 mg/dL (calc)
Non-HDL Cholesterol (Calc): 92 mg/dL (calc) (ref <130)
Total CHOL/HDL Ratio: 3 (calc) (ref <5.0)
Triglycerides: 175 mg/dL — ABNORMAL HIGH (ref <150)

## 2023-04-29 LAB — CBC WITH DIFFERENTIAL/PLATELET
Basophils Absolute: 58 cells/uL (ref 0–200)
Eosinophils Absolute: 58 cells/uL (ref 15–500)
Eosinophils Relative: 0.9 %
Hemoglobin: 13.1 g/dL (ref 11.7–15.5)
Lymphs Abs: 1350 cells/uL (ref 850–3900)
MCV: 87.9 fL (ref 80.0–100.0)
Neutrophils Relative %: 70.7 %
RDW: 13.2 % (ref 11.0–15.0)

## 2023-04-29 LAB — COMPLETE METABOLIC PANEL WITH GFR
AG Ratio: 1.6 (calc) (ref 1.0–2.5)
ALT: 13 U/L (ref 6–29)
AST: 12 U/L (ref 10–35)
Albumin: 4 g/dL (ref 3.6–5.1)
Alkaline phosphatase (APISO): 81 U/L (ref 37–153)
BUN: 17 mg/dL (ref 7–25)
CO2: 30 mmol/L (ref 20–32)
Calcium: 9.8 mg/dL (ref 8.6–10.4)
Chloride: 108 mmol/L (ref 98–110)
Creat: 0.81 mg/dL (ref 0.50–1.05)
Globulin: 2.5 g/dL (calc) (ref 1.9–3.7)
Glucose, Bld: 106 mg/dL — ABNORMAL HIGH (ref 65–99)
Potassium: 3.8 mmol/L (ref 3.5–5.3)
Sodium: 144 mmol/L (ref 135–146)
Total Bilirubin: 0.4 mg/dL (ref 0.2–1.2)
Total Protein: 6.5 g/dL (ref 6.1–8.1)
eGFR: 79 mL/min/{1.73_m2} (ref >=60)

## 2023-04-29 LAB — HEMOGLOBIN A1C
Hgb A1c MFr Bld: 6.6 % of total Hgb — ABNORMAL HIGH (ref <5.7)
Mean Plasma Glucose: 143 mg/dL
eAG (mmol/L): 7.9 mmol/L

## 2023-05-28 ENCOUNTER — Ambulatory Visit
Admission: RE | Admit: 2023-05-28 | Discharge: 2023-05-28 | Disposition: A | Discharge status: Home / Self Care | Payer: Medicare HMO | Source: Ambulatory Visit | Attending: Family Medicine | Admitting: Family Medicine

## 2023-05-28 DIAGNOSIS — N6021 Fibroadenosis of right breast: Secondary | ICD-10-CM | POA: Diagnosis not present

## 2023-05-28 DIAGNOSIS — N6310 Unspecified lump in the right breast, unspecified quadrant: Secondary | ICD-10-CM | POA: Diagnosis not present

## 2023-05-28 DIAGNOSIS — R928 Other abnormal and inconclusive findings on diagnostic imaging of breast: Secondary | ICD-10-CM

## 2023-05-28 DIAGNOSIS — N6315 Unspecified lump in the right breast, overlapping quadrants: Secondary | ICD-10-CM | POA: Diagnosis not present

## 2023-05-28 MED ORDER — GADOPICLENOL 0.5 MMOL/ML IV SOLN
6.0000 mL | Freq: Once | INTRAVENOUS | Status: AC | PRN
  Administered 2023-05-28: 6 mL via INTRAVENOUS

## 2023-06-11 ENCOUNTER — Ambulatory Visit (INDEPENDENT_AMBULATORY_CARE_PROVIDER_SITE_OTHER): Payer: Medicare HMO

## 2023-06-11 VITALS — BP 108/62 | Ht 61.0 in | Wt 129.6 lb

## 2023-06-11 DIAGNOSIS — Z0000 Encounter for general adult medical examination without abnormal findings: Secondary | ICD-10-CM

## 2023-06-11 NOTE — Progress Notes (Signed)
Subjective:   Kristine Lowe is a 67 y.o. female who presents for Medicare Annual (Subsequent) preventive examination.  Visit Complete: In person  Patient Medicare AWV questionnaire was completed by the patient on (not done); I have confirmed that all information answered by patient is correct and no changes since this date.  Review of Systems    Cardiac Risk Factors include: advanced age (>81men, >16 women);dyslipidemia;hypertension;sedentary lifestyle;smoking/ tobacco exposure    Objective:    Today's Vitals   06/11/23 1138  BP: 108/62  Weight: 129 lb 9.6 oz (58.8 kg)  Height: 5\' 1"  (1.549 m)   Body mass index is 24.49 kg/m.     06/11/2023   11:46 AM 05/28/2021   10:53 AM 05/24/2020   11:16 AM 05/11/2019    5:03 AM 02/11/2017    7:27 PM 02/15/2016   11:05 AM 10/11/2015   10:57 AM  Advanced Directives  Does Patient Have a Medical Advance Directive? No No No No No No No  Would patient like information on creating a medical advance directive?  Yes (MAU/Ambulatory/Procedural Areas - Information given) No - Patient declined No - Patient declined No - Patient declined No - patient declined information No - patient declined information    Current Medications (verified) Outpatient Encounter Medications as of 06/11/2023  Medication Sig   albuterol (VENTOLIN HFA) 108 (90 Base) MCG/ACT inhaler INHALE 2 PUFFS BY MOUTH EVERY 6 HOURS AS NEEDED FOR WHEEZING OR SHORTNESS OF BREATH   ARIPiprazole (ABILIFY) 5 MG tablet Take 1 tablet (5 mg total) by mouth every evening.   azelastine (ASTELIN) 0.1 % nasal spray Place 2 sprays into both nostrils 2 (two) times daily. Use in each nostril as directed   busPIRone (BUSPAR) 5 MG tablet Take 1 tablet (5 mg total) by mouth 2 (two) times daily as needed.   Cholecalciferol (VITAMIN D) 50 MCG (2000 UT) CAPS Take 1 capsule (2,000 Units total) by mouth daily.   cyclobenzaprine (FLEXERIL) 5 MG tablet Take 1 tablet (5 mg total) by mouth 3 (three) times daily  as needed for muscle spasms.   EQ ALLERGY RELIEF 10 MG tablet Take 1 tablet by mouth once daily   fluticasone (FLONASE) 50 MCG/ACT nasal spray Place 2 sprays into both nostrils daily.   losartan (COZAAR) 50 MG tablet Take 1 tablet (50 mg total) by mouth daily.   meclizine (ANTIVERT) 12.5 MG tablet Take 1 tablet (12.5 mg total) by mouth 3 (three) times daily as needed for dizziness.   montelukast (SINGULAIR) 10 MG tablet Take 1 tablet (10 mg total) by mouth at bedtime.   rosuvastatin (CRESTOR) 20 MG tablet Take 1 tablet (20 mg total) by mouth daily.   No facility-administered encounter medications on file as of 06/11/2023.    Allergies (verified) Penicillins, Simvastatin, and Zoloft [sertraline hcl]   History: Past Medical History:  Diagnosis Date   Allergy    Anxiety    Chronic obstructive airway disease (HCC)    Dizziness and giddiness    Esophageal reflux    Hypertension    Insomnia    Lumbago    Lumbosacral neuritis    Pure hypercholesterolemia    Past Surgical History:  Procedure Laterality Date   BREAST BIOPSY Right 04/15/2023   u/s guided bx, 12:00, VENUS clip-path pending   BREAST BIOPSY Right 04/15/2023   [place] RT BREAST BX W LOC DEV 1ST LESION IMG BX SPEC US GUIDE 04/15/2023 [facility]   BREAST EXCISIONAL BIOPSY Left 1996   neg  Family History  Problem Relation Age of Onset   Breast cancer Mother 23   Alzheimer's disease Mother    Kidney disease Mother    Breast cancer Maternal Aunt 34   Breast cancer Paternal Grandmother    Polycythemia Maternal Grandmother    Social History   Socioeconomic History   Marital status: Married    Spouse name: Not on file   Number of children: 7   Years of education: Not on file   Highest education level: Some college, no degree  Occupational History   Occupation: member Teaching laboratory technician   Tobacco Use   Smoking status: Some Days    Current packs/day: 0.25    Average packs/day: 0.3 packs/day for 38.0 years (9.5 ttl pk-yrs)     Types: E-cigarettes, Cigarettes   Smokeless tobacco: Never  Vaping Use   Vaping status: Some Days  Substance and Sexual Activity   Alcohol use: No    Alcohol/week: 0.0 standard drinks of alcohol   Drug use: No   Sexual activity: Yes    Partners: Male  Other Topics Concern   Not on file  Social History Narrative   Married , has 7 grown children, working part time   International aid/development worker of Corporate investment banker Strain: Low Risk  (06/11/2023)   Overall Financial Resource Strain (CARDIA)    Difficulty of Paying Living Expenses: Not hard at all  Food Insecurity: No Food Insecurity (06/11/2023)   Hunger Vital Sign    Worried About Running Out of Food in the Last Year: Never true    Ran Out of Food in the Last Year: Never true  Transportation Needs: No Transportation Needs (06/11/2023)   PRAPARE - Administrator, Civil Service (Medical): No    Lack of Transportation (Non-Medical): No  Physical Activity: Inactive (06/11/2023)   Exercise Vital Sign    Days of Exercise per Week: 0 days    Minutes of Exercise per Session: 0 min  Stress: Stress Concern Present (06/11/2023)   [employer] of Occupational Health - Occupational Stress Questionnaire    Feeling of Stress : To some extent  Social Connections: Socially Integrated (06/11/2023)   Social Connection and Isolation Panel [NHANES]    Frequency of Communication with Friends and Family: More than three times a week    Frequency of Social Gatherings with Friends and Family: More than three times a week    Attends Religious Services: More than 4 times per year    Active Member of [employer] or Organizations: Yes    Attends Engineer, structural: More than 4 times per year    Marital Status: Married    Tobacco Counseling Ready to quit: Not Answered Counseling given: Not Answered   Clinical Intake:  Pre-visit preparation completed: Yes  Pain : No/denies pain     BMI - recorded: 24.49 Nutritional Status: BMI of  19-24  Normal Nutritional Risks: None Diabetes: No  How often do you need to have someone help you when you read instructions, pamphlets, or other written materials from your doctor or pharmacy?: 1 - Never  Interpreter Needed?: No  Comments: lives with husband Information entered by :: B.Kalianna Verbeke,LPN   Activities of Daily Living    06/11/2023   11:46 AM 04/28/2023   11:39 AM  In your present state of health, do you have any difficulty performing the following activities:  Hearing? 0 0  Vision? 0 0  Difficulty concentrating or making decisions? 0 0  Walking or climbing  stairs? 0 0  Dressing or bathing? 0 0  Doing errands, shopping? 0 0  Preparing Food and eating ? N   Using the Toilet? N   In the past six months, have you accidently leaked urine? N   Do you have problems with loss of bowel control? N   Managing your Medications? N   Managing your Finances? N   Housekeeping or managing your Housekeeping? N     Patient Care Team: Alba Cory, MD as PCP - General (Family Medicine)  Indicate any recent Medical Services you may have received from other than [facility] providers in the past year (date may be approximate).     Assessment:   This is a routine wellness examination for Altheimer.  Hearing/Vision screen Hearing Screening - Comments:: Adequate hearing Vision Screening - Comments:: Adequate vision with glasses [facility]   Goals Addressed             This Visit's Progress    Increase physical activity   Not on track    Recommend increasing physical activity to at least 3 times per week for 30 minutes        Depression Screen    06/11/2023   11:44 AM 04/28/2023   11:38 AM 11/04/2022   11:34 AM 06/30/2022   12:01 PM 06/03/2022   11:03 AM 05/05/2022   11:46 AM 01/03/2022   11:33 AM  PHQ 2/9 Scores  PHQ - 2 Score 0 0 0 0 0 3 0  PHQ- 9 Score  0 0 0 0 4     Fall Risk    06/11/2023   11:41 AM 04/28/2023   11:38 AM 11/04/2022   11:34 AM 06/30/2022   12:03 PM  06/03/2022   11:08 AM  Fall Risk   Falls in the past year? 0 0 0 0 0  Number falls in past yr: 0  0    Injury with Fall? 0  0    Risk for fall due to : No Fall Risks No Fall Risks No Fall Risks No Fall Risks No Fall Risks  Follow up Education provided;Falls prevention discussed Falls prevention discussed Falls prevention discussed Falls prevention discussed;Education provided;Falls evaluation completed Falls prevention discussed    MEDICARE RISK AT HOME: Medicare Risk at Home Any stairs in or around the home?: No If so, are there any without handrails?: No Home free of loose throw rugs in walkways, pet beds, electrical cords, etc?: Yes Adequate lighting in your home to reduce risk of falls?: Yes Life alert?: No Use of a cane, walker or w/c?: No Grab bars in the bathroom?: Yes Shower chair or bench in shower?: No Elevated toilet seat or a handicapped toilet?: Yes  TIMED UP AND GO:  Was the test performed?  Yes  Length of time to ambulate 10 feet: 8 sec Gait steady and fast without use of assistive device    Cognitive Function:        06/11/2023   11:49 AM 06/03/2022   11:06 AM  6CIT Screen  What Year? 0 points 0 points  What month? 0 points 0 points  What time? 0 points 0 points  Count back from 20 0 points 0 points  Months in reverse 0 points 0 points  Repeat phrase 0 points 0 points  Total Score 0 points 0 points    Immunizations Immunization History  Administered Date(s) Administered   Fluad Quad(high Dose 65+) 07/26/2019, 06/26/2020, 06/28/2021, 06/30/2022   Moderna Sars-Covid-2 Vaccination 11/17/2019, 12/15/2019, 09/15/2020  Pneumococcal Conjugate-13 04/26/2019   Pneumococcal Polysaccharide-23 05/24/2020   Respiratory Syncytial Virus Vaccine,Recomb Aduvanted(Arexvy) 07/26/2022    TDAP status: Up to date  Flu Vaccine status: Up to date  Pneumococcal vaccine status: Up to date  Covid-19 vaccine status: Completed vaccines  Qualifies for Shingles Vaccine?  Yes   Zostavax completed Yes   Shingrix Completed?: No.    Education has been provided regarding the importance of this vaccine. Patient has been advised to call insurance company to determine out of pocket expense if they have not yet received this vaccine. Advised may also receive vaccine at local pharmacy or Health Dept. Verbalized acceptance and understanding.  Screening Tests Health Maintenance  Topic Date Due   Fecal DNA (Cologuard)  Never done   COVID-19 Vaccine (4 - 2023-24 season) 06/07/2023   Zoster Vaccines- Shingrix (1 of 2) 07/28/2023 (Originally 02/05/1975)   INFLUENZA VACCINE  01/04/2024 (Originally 05/07/2023)   MAMMOGRAM  04/01/2024   Medicare Annual Wellness (AWV)  06/10/2024   Pneumonia Vaccine 31+ Years old  Completed   DEXA SCAN  Completed   Hepatitis C Screening  Completed   HPV VACCINES  Aged Out   DTaP/Tdap/Td  Discontinued    Health Maintenance  Health Maintenance Due  Topic Date Due   Fecal DNA (Cologuard)  Never done   COVID-19 Vaccine (4 - 2023-24 season) 06/07/2023    Colorectal cancer screening: Type of screening: Colonoscopy. Completed no. Repeat every 5-10 years  Mammogram status: Completed yes. Repeat every year  Bone Density status: Completed yes. Results reflect: Bone density results: NORMAL. Repeat every 5 years.  Lung Cancer Screening: (Low Dose CT Chest recommended if Age 51-80 years, 20 pack-year currently smoking OR have quit w/in 15years.) does not qualify.   Lung Cancer Screening Referral: yes pt declines  Additional Screening:  Hepatitis C Screening: does not qualify; Completed yes  Vision Screening: Recommended annual ophthalmology exams for early detection of glaucoma and other disorders of the eye. Is the patient up to date with their annual eye exam?  Yes  Who is the provider or what is the name of the office in which the patient attends annual eye exams? [facility] If pt is not established with a provider, would they like to  be referred to a provider to establish care? No .   Dental Screening: Recommended annual dental exams for proper oral hygiene  Diabetic Foot Exam: n/a  Community Resource Referral / Chronic Care Management: CRR required this visit?  No   CCM required this visit?  No     Plan:     I have personally reviewed and noted the following in the patient's chart:   Medical and social history Use of alcohol, tobacco or illicit drugs  Current medications and supplements including opioid prescriptions. Patient is not currently taking opioid prescriptions. Functional ability and status Nutritional status Physical activity Advanced directives List of other physicians Hospitalizations, surgeries, and ER visits in previous 12 months Vitals Screenings to include cognitive, depression, and falls Referrals and appointments  In addition, I have reviewed and discussed with patient certain preventive protocols, quality metrics, and best practice recommendations. A written personalized care plan for preventive services as well as general preventive health recommendations were provided to patient.     Sue Lush, LPN   10/11/1094   After Visit Summary: (MyChart) Due to this being a telephonic visit, the after visit summary with patients personalized plan was offered to patient via MyChart   Nurse Notes: The patient  states she is doing well and has no concerns or questions at this time. -Pt offered Influenza vaccine in which she declined until her PE appt (on her way to work).

## 2023-06-11 NOTE — Patient Instructions (Signed)
Kristine Lowe , Thank you for taking time to come for your Medicare Wellness Visit. I appreciate your ongoing commitment to your health goals. Please review the following plan we discussed and let me know if I can assist you in the future.   Referrals/Orders/Follow-Ups/Clinician Recommendations: none  This is a list of the screening recommended for you and due dates:  Health Maintenance  Topic Date Due   Cologuard (Stool DNA test)  Never done   COVID-19 Vaccine (4 - 2023-24 season) 06/07/2023   Zoster (Shingles) Vaccine (1 of 2) 07/28/2023*   Flu Shot  01/04/2024*   Mammogram  04/01/2024   Medicare Annual Wellness Visit  06/10/2024   Pneumonia Vaccine  Completed   DEXA scan (bone density measurement)  Completed   Hepatitis C Screening  Completed   HPV Vaccine  Aged Out   DTaP/Tdap/Td vaccine  Discontinued  *Topic was postponed. The date shown is not the original due date.    Advanced directives: (Declined) Advance directive discussed with you today. Even though you declined this today, please call our office should you change your mind, and we can give you the proper paperwork for you to fill out.  Next Medicare Annual Wellness Visit scheduled for next year: Yes 06/23/2024 @ 1130am  in person

## 2023-06-12 ENCOUNTER — Other Ambulatory Visit: Payer: Self-pay

## 2023-06-12 ENCOUNTER — Telehealth: Payer: Self-pay | Admitting: Family Medicine

## 2023-06-12 DIAGNOSIS — R928 Other abnormal and inconclusive findings on diagnostic imaging of breast: Secondary | ICD-10-CM

## 2023-06-12 NOTE — Telephone Encounter (Signed)
Called patient and informed her follow-up imaging has been ordered.

## 2023-06-12 NOTE — Telephone Encounter (Signed)
Copied from CRM [id number]. Topic: Referral - Status >> Jun 12, 2023 11:41 AM Macon Large wrote: Reason for CRM: Pt stated she needs a referral for follow up mammography to be done in September.

## 2023-07-01 ENCOUNTER — Ambulatory Visit
Admission: RE | Admit: 2023-07-01 | Discharge: 2023-07-01 | Disposition: A | Discharge status: Home / Self Care | Payer: Medicare HMO | Source: Ambulatory Visit | Attending: Family Medicine | Admitting: Family Medicine

## 2023-07-01 DIAGNOSIS — N6314 Unspecified lump in the right breast, lower inner quadrant: Secondary | ICD-10-CM | POA: Diagnosis not present

## 2023-07-01 DIAGNOSIS — R928 Other abnormal and inconclusive findings on diagnostic imaging of breast: Secondary | ICD-10-CM

## 2023-07-01 DIAGNOSIS — N6315 Unspecified lump in the right breast, overlapping quadrants: Secondary | ICD-10-CM | POA: Diagnosis not present

## 2023-07-01 DIAGNOSIS — D241 Benign neoplasm of right breast: Secondary | ICD-10-CM | POA: Diagnosis not present

## 2023-07-01 DIAGNOSIS — N644 Mastodynia: Secondary | ICD-10-CM | POA: Diagnosis not present

## 2023-07-03 ENCOUNTER — Encounter: Payer: Medicare HMO | Admitting: Family Medicine

## 2023-07-13 ENCOUNTER — Other Ambulatory Visit: Payer: Self-pay

## 2023-07-13 DIAGNOSIS — R928 Other abnormal and inconclusive findings on diagnostic imaging of breast: Secondary | ICD-10-CM

## 2023-07-13 NOTE — Progress Notes (Signed)
Orders entered

## 2023-07-13 NOTE — Progress Notes (Unsigned)
Name: Kristine Lowe   MRN: [id number]    DOB: 15-Jun-1956   Date:07/14/2023       Progress Note  Subjective  Chief Complaint  Annual Exam  HPI  Patient presents for annual CPE.  Diet: " I eat anything I like "  - she cooks mostly at home , fries food about once or twice a week Exercise:  discussed regular physical activity - sedentary job  Last Eye Exam: up to date  Last Dental Exam: up to date   [employer] Visit from 07/14/2023 in [facility]  AUDIT-C Score 1      Depression: Phq 9 is  negative    07/14/2023   10:46 AM 06/11/2023   11:44 AM 04/28/2023   11:38 AM 11/04/2022   11:34 AM 06/30/2022   12:01 PM  Depression screen PHQ 2/9  Decreased Interest 0 0 0 0 0  Down, Depressed, Hopeless 0 0 0 0 0  PHQ - 2 Score 0 0 0 0 0  Altered sleeping 0  0 0 0  Tired, decreased energy 0  0 0 0  Change in appetite 0  0 0 0  Feeling bad or failure about yourself  0  0 0 0  Trouble concentrating 0  0 0 0  Moving slowly or fidgety/restless 0  0 0 0  Suicidal thoughts 0  0 0 0  PHQ-9 Score 0  0 0 0   Hypertension: BP Readings from Last 3 Encounters:  07/14/23 118/68  06/11/23 108/62  04/28/23 116/64   Obesity: Wt Readings from Last 3 Encounters:  07/14/23 130 lb (59 kg)  06/11/23 129 lb 9.6 oz (58.8 kg)  04/28/23 131 lb (59.4 kg)   BMI Readings from Last 3 Encounters:  07/14/23 25.39 kg/m  06/11/23 24.49 kg/m  04/28/23 24.75 kg/m     Vaccines:   RSV: up to date Tdap: she will go to local pharmacy  Shingrix: she will go to local pharmacy  Pneumonia: up to date Flu: today  COVID-19: discussed booster at local pharmacy    Hep C Screening: 04/22/19 STD testing and prevention (HIV/chl/gon/syphilis): N/A Intimate partner violence: negative screen  Sexual History : married, one partner, no problems Menstrual History/LMP/Abnormal Bleeding: s/p menopause Discussed importance of follow up if any post-menopausal bleeding: yes   Incontinence Symptoms: negative for symptoms   Breast cancer:  - Last Mammogram: Ordered 07/13/23 - repeat in 6 months  - BRCA gene screening: N/A  Osteoporosis Prevention : Discussed high calcium and vitamin D supplementation, weight bearing exercises Bone density: 09/23/21 she will have it done with next mammogram   Cervical cancer screening: N/A  Skin cancer: Discussed monitoring for atypical lesions  Colorectal cancer: agrees with Fit test  Lung cancer:  Low Dose CT Chest recommended if Age 66-80 years, 20 pack-year currently smoking OR have quit w/in 15years. Patient qualifies but not interested  ECG: 04/22/19  Advanced Care Planning: A voluntary discussion about advance care planning including the explanation and discussion of advance directives.  Discussed health care proxy and Living will, and the patient was able to identify a health care proxy as husband .  Patient does not have a living will and power of attorney of health care   Lipids: Lab Results  Component Value Date   CHOL 139 04/28/2023   CHOL 173 06/30/2022   CHOL 163 06/28/2021   Lab Results  Component Value Date   HDL 47 (L) 04/28/2023   HDL  59 06/30/2022   HDL 50 06/28/2021   Lab Results  Component Value Date   LDLCALC 67 04/28/2023   LDLCALC 94 06/30/2022   LDLCALC 92 06/28/2021   Lab Results  Component Value Date   TRIG 175 (H) 04/28/2023   TRIG 104 06/30/2022   TRIG 118 06/28/2021   Lab Results  Component Value Date   CHOLHDL 3.0 04/28/2023   CHOLHDL 2.9 06/30/2022   CHOLHDL 3.3 06/28/2021   No results found for: "LDLDIRECT"  Glucose: Glucose  Date Value Ref Range Status  02/20/2014 85 65 - 99 mg/dL Final  13/05/6577 94 65 - 99 mg/dL Final   Glucose, Bld  Date Value Ref Range Status  04/28/2023 106 (H) 65 - 99 mg/dL Final    Comment:    .            Fasting reference interval . For someone without known diabetes, a glucose value between 100 and 125 mg/dL is consistent  with prediabetes and should be confirmed with a follow-up test. .   06/30/2022 74 65 - 99 mg/dL Final    Comment:    .            Fasting reference interval .   06/28/2021 79 65 - 99 mg/dL Final    Comment:    .            Fasting reference interval .     Patient Active Problem List   Diagnosis Date Noted   Mood disorder (HCC) 11/04/2022   Atherosclerosis of aorta (HCC) 11/04/2022   Hypertension, benign 11/04/2022   Leg pain 04/08/2020   Dyslipidemia 06/29/2019   History of gestational diabetes 02/13/2017   Vitamin D deficiency 02/15/2016   Obstructive apnea 02/15/2016   Hyperglycemia 02/15/2016   Abnormal LFTs 02/15/2016   Dermatitis, eczematoid 02/15/2016   Generalized anxiety disorder 10/11/2015   Simple chronic bronchitis (HCC) 10/11/2015   Chronic back pain 10/11/2015   Chronic constipation 10/11/2015   Reflux esophagitis 05/01/2007    Past Surgical History:  Procedure Laterality Date   BREAST BIOPSY Right 04/15/2023   u/s guided bx, 12:00, VENUS clip-benign   BREAST BIOPSY Right 04/15/2023   [place] RT BREAST BX W LOC DEV 1ST LESION IMG BX SPEC US GUIDE 04/15/2023 [facility]   BREAST EXCISIONAL BIOPSY Left 1996   neg    Family History  Problem Relation Age of Onset   Breast cancer Mother 20   Alzheimer's disease Mother    Kidney disease Mother    Breast cancer Maternal Aunt 64   Breast cancer Paternal Grandmother    Polycythemia Maternal Grandmother     Social History   Socioeconomic History   Marital status: Married    Spouse name: Not on file   Number of children: 7   Years of education: Not on file   Highest education level: Some college, no degree  Occupational History   Occupation: member Teaching laboratory technician   Tobacco Use   Smoking status: Some Days    Current packs/day: 0.25    Average packs/day: 0.3 packs/day for 38.0 years (9.5 ttl pk-yrs)    Types: E-cigarettes, Cigarettes   Smokeless tobacco: Never  Vaping Use   Vaping status: Some  Days  Substance and Sexual Activity   Alcohol use: No    Alcohol/week: 0.0 standard drinks of alcohol   Drug use: No   Sexual activity: Yes    Partners: Male  Other Topics Concern   Not on file  Social History Narrative  Married , has 7 grown children, working part time   International aid/development worker of Corporate investment banker Strain: Low Risk  (06/11/2023)   Overall Financial Resource Strain (CARDIA)    Difficulty of Paying Living Expenses: Not hard at all  Food Insecurity: No Food Insecurity (06/11/2023)   Hunger Vital Sign    Worried About Running Out of Food in the Last Year: Never true    Ran Out of Food in the Last Year: Never true  Transportation Needs: No Transportation Needs (06/11/2023)   PRAPARE - Administrator, Civil Service (Medical): No    Lack of Transportation (Non-Medical): No  Physical Activity: Inactive (06/11/2023)   Exercise Vital Sign    Days of Exercise per Week: 0 days    Minutes of Exercise per Session: 0 min  Stress: Stress Concern Present (06/11/2023)   [employer] of Occupational Health - Occupational Stress Questionnaire    Feeling of Stress : To some extent  Social Connections: Socially Integrated (06/11/2023)   Social Connection and Isolation Panel [NHANES]    Frequency of Communication with Friends and Family: More than three times a week    Frequency of Social Gatherings with Friends and Family: More than three times a week    Attends Religious Services: More than 4 times per year    Active Member of [employer] or Organizations: Yes    Attends Engineer, structural: More than 4 times per year    Marital Status: Married  Catering manager Violence: Not At Risk (06/11/2023)   Humiliation, Afraid, Rape, and Kick questionnaire    Fear of Current or Ex-Partner: No    Emotionally Abused: No    Physically Abused: No    Sexually Abused: No     Current Outpatient Medications:    albuterol (VENTOLIN HFA) 108 (90 Base) MCG/ACT inhaler, INHALE  2 PUFFS BY MOUTH EVERY 6 HOURS AS NEEDED FOR WHEEZING OR SHORTNESS OF BREATH, Disp: 18 g, Rfl: 1   ARIPiprazole (ABILIFY) 5 MG tablet, Take 1 tablet (5 mg total) by mouth every evening., Disp: 90 tablet, Rfl: 1   azelastine (ASTELIN) 0.1 % nasal spray, Place 2 sprays into both nostrils 2 (two) times daily. Use in each nostril as directed, Disp: 30 mL, Rfl: 1   busPIRone (BUSPAR) 5 MG tablet, Take 1 tablet (5 mg total) by mouth 2 (two) times daily as needed., Disp: 180 tablet, Rfl: 1   Cholecalciferol (VITAMIN D) 50 MCG (2000 UT) CAPS, Take 1 capsule (2,000 Units total) by mouth daily., Disp: 30 capsule, Rfl: 0   cyclobenzaprine (FLEXERIL) 5 MG tablet, Take 1 tablet (5 mg total) by mouth 3 (three) times daily as needed for muscle spasms., Disp: 90 tablet, Rfl: 0   EQ ALLERGY RELIEF 10 MG tablet, Take 1 tablet by mouth once daily, Disp: 90 tablet, Rfl: 0   fluticasone (FLONASE) 50 MCG/ACT nasal spray, Place 2 sprays into both nostrils daily., Disp: 16 g, Rfl: 1   losartan (COZAAR) 50 MG tablet, Take 1 tablet (50 mg total) by mouth daily., Disp: 90 tablet, Rfl: 1   meclizine (ANTIVERT) 12.5 MG tablet, Take 1 tablet (12.5 mg total) by mouth 3 (three) times daily as needed for dizziness., Disp: 30 tablet, Rfl: 0   montelukast (SINGULAIR) 10 MG tablet, Take 1 tablet (10 mg total) by mouth at bedtime., Disp: 90 tablet, Rfl: 1   rosuvastatin (CRESTOR) 20 MG tablet, Take 1 tablet (20 mg total) by mouth daily., Disp: 90 tablet,  Rfl: 1  Allergies  Allergen Reactions   Penicillins Swelling   Simvastatin    Zoloft [Sertraline Hcl] Other (See Comments)    Shop lift     ROS  Constitutional: Negative for fever or weight change.  Respiratory: Negative for cough and shortness of breath.   Cardiovascular: Negative for chest pain or palpitations.  Gastrointestinal: Negative for abdominal pain, no bowel changes.  Musculoskeletal: Negative for gait problem or joint swelling.  Skin: Negative for rash.   Neurological: Negative for dizziness or headache.  No other specific complaints in a complete review of systems (except as listed in HPI above).   Objective  Vitals:   07/14/23 1047  BP: 118/68  Pulse: 92  Resp: 16  SpO2: 99%  Weight: 130 lb (59 kg)  Height: 5' (1.524 m)    Body mass index is 25.39 kg/m.  Physical Exam  Constitutional: Patient appears well-developed and well-nourished. No distress.  HENT: Head: Normocephalic and atraumatic. Ears: B TMs ok, no erythema or effusion; Nose: Nose normal. Mouth/Throat: Oropharynx is clear and moist. No oropharyngeal exudate.  Eyes: Conjunctivae and EOM are normal. Pupils are equal, round, and reactive to light. No scleral icterus.  Neck: Normal range of motion. Neck supple. No JVD present. No thyromegaly present.  Cardiovascular: Normal rate, regular rhythm and normal heart sounds.  No murmur heard. No BLE edema. Pulmonary/Chest: Effort normal and breath sounds normal. No respiratory distress. Abdominal: Soft. Bowel sounds are normal, no distension. There is no tenderness. no masses Breast: no lumps or masses, no nipple discharge or rashes FEMALE GENITALIA:  Not done  RECTAL: not done  Musculoskeletal: Normal range of motion, no joint effusions. No gross deformities Neurological: he is alert and oriented to person, place, and time. No cranial nerve deficit. Coordination, balance, strength, speech and gait are normal.  Skin: Skin is warm and dry. No rash noted. No erythema.  Psychiatric: Patient has a normal mood and affect. behavior is normal. Judgment and thought content normal.   Recent Results (from the past 2160 hour(s))  Lipid panel     Status: Abnormal   Collection Time: 04/28/23 11:56 AM  Result Value Ref Range   Cholesterol 139 <200 mg/dL   HDL 47 (L) > OR = 50 mg/dL   Triglycerides 865 (H) <150 mg/dL   LDL Cholesterol (Calc) 67 mg/dL (calc)    Comment: Reference range: <100 . Desirable range <100 mg/dL for primary  prevention;   <70 mg/dL for patients with CHD or diabetic patients  with > or = 2 CHD risk factors. Marland Kitchen LDL-C is now calculated using the Martin-Hopkins  calculation, which is a validated novel method providing  better accuracy than the Friedewald equation in the  estimation of LDL-C.  Horald Pollen et al. Lenox Ahr. 7846;962(95): 2061-2068  (http://education.[facility].com/faq/FAQ164)    Total CHOL/HDL Ratio 3.0 <5.0 (calc)   Non-HDL Cholesterol (Calc) 92 <284 mg/dL (calc)    Comment: For patients with diabetes plus 1 major ASCVD risk  factor, treating to a non-HDL-C goal of <100 mg/dL  (LDL-C of <13 mg/dL) is considered a therapeutic  option.   CBC with Differential/Platelet     Status: Abnormal   Collection Time: 04/28/23 11:56 AM  Result Value Ref Range   WBC 6.4 3.8 - 10.8 Thousand/uL   RBC 4.70 3.80 - 5.10 Million/uL   Hemoglobin 13.1 11.7 - 15.5 g/dL   HCT 24.4 01.0 - 27.2 %   MCV 87.9 80.0 - 100.0 fL   MCH 27.9 27.0 -  33.0 pg   MCHC 31.7 (L) 32.0 - 36.0 g/dL   RDW 78.2 95.6 - 21.3 %   Platelets 280 140 - 400 Thousand/uL   MPV 10.8 7.5 - 12.5 fL   Neutro Abs 4,525 1,500 - 7,800 cells/uL   Lymphs Abs 1,350 850 - 3,900 cells/uL   Absolute Monocytes 410 200 - 950 cells/uL   Eosinophils Absolute 58 15 - 500 cells/uL   Basophils Absolute 58 0 - 200 cells/uL   Neutrophils Relative % 70.7 %   Total Lymphocyte 21.1 %   Monocytes Relative 6.4 %   Eosinophils Relative 0.9 %   Basophils Relative 0.9 %  COMPLETE METABOLIC PANEL WITH GFR     Status: Abnormal   Collection Time: 04/28/23 11:56 AM  Result Value Ref Range   Glucose, Bld 106 (H) 65 - 99 mg/dL    Comment: .            Fasting reference interval . For someone without known diabetes, a glucose value between 100 and 125 mg/dL is consistent with prediabetes and should be confirmed with a follow-up test. .    BUN 17 7 - 25 mg/dL   Creat 0.86 5.78 - 4.69 mg/dL   eGFR 79 > OR = 60 GE/XBM/8.41L2   BUN/Creatinine Ratio  SEE NOTE: 6 - 22 (calc)    Comment:    Not Reported: BUN and Creatinine are within    reference range. .    Sodium 144 135 - 146 mmol/L   Potassium 3.8 3.5 - 5.3 mmol/L   Chloride 108 98 - 110 mmol/L   CO2 30 20 - 32 mmol/L   Calcium 9.8 8.6 - 10.4 mg/dL   Total Protein 6.5 6.1 - 8.1 g/dL   Albumin 4.0 3.6 - 5.1 g/dL   Globulin 2.5 1.9 - 3.7 g/dL (calc)   AG Ratio 1.6 1.0 - 2.5 (calc)   Total Bilirubin 0.4 0.2 - 1.2 mg/dL   Alkaline phosphatase (APISO) 81 37 - 153 U/L   AST 12 10 - 35 U/L   ALT 13 6 - 29 U/L  Hemoglobin A1c     Status: Abnormal   Collection Time: 04/28/23 11:56 AM  Result Value Ref Range   Hgb A1c MFr Bld 6.6 (H) <5.7 % of total Hgb    Comment: For someone without known diabetes, a hemoglobin A1c value of 6.5% or greater indicates that they may have  diabetes and this should be confirmed with a follow-up  test. . For someone with known diabetes, a value <7% indicates  that their diabetes is well controlled and a value  greater than or equal to 7% indicates suboptimal  control. A1c targets should be individualized based on  duration of diabetes, age, comorbid conditions, and  other considerations. . Currently, no consensus exists regarding use of hemoglobin A1c for diagnosis of diabetes for children. .    Mean Plasma Glucose 143 mg/dL   eAG (mmol/L) 7.9 mmol/L    Comment: . This test was performed on the Roche cobas c503 platform. Effective 07/14/22, a change in test platforms from the Abbott Architect to the Roche cobas c503 may have shifted HbA1c results compared to historical results. Based on laboratory validation testing conducted at [facility], the Roche platform relative to the Abbott platform had an average increase in HbA1c value of < or = 0.3%. This difference is within accepted  variability established by the [facility]. Note that not all individuals will have had a shift in  their results and  direct comparisons between historical and current results for testing conducted on different platforms is not recommended.      Fall Risk:    07/14/2023   10:46 AM 06/11/2023   11:41 AM 04/28/2023   11:38 AM 11/04/2022   11:34 AM 06/30/2022   12:03 PM  Fall Risk   Falls in the past year? 0 0 0 0 0  Number falls in past yr: 0 0  0   Injury with Fall? 0 0  0   Risk for fall due to : No Fall Risks No Fall Risks No Fall Risks No Fall Risks No Fall Risks  Follow up Falls prevention discussed Education provided;Falls prevention discussed Falls prevention discussed Falls prevention discussed Falls prevention discussed;Education provided;Falls evaluation completed     Functional Status Survey: Is the patient deaf or have difficulty hearing?: No Does the patient have difficulty seeing, even when wearing glasses/contacts?: No Does the patient have difficulty concentrating, remembering, or making decisions?: No Does the patient have difficulty walking or climbing stairs?: No Does the patient have difficulty dressing or bathing?: No Does the patient have difficulty doing errands alone such as visiting a doctor's office or shopping?: No   Assessment & Plan  1. Well adult exam   2. Need for immunization against influenza  - Flu Vaccine Trivalent High Dose (Fluad)  3. Osteopenia after menopause  - DG Bone Density; Future  4. Colon cancer screening  - Fecal Globin By Immunochemistry  5. Need for Tdap vaccination  She will go to pharmacy   6. Hyperglycemia  - Hemoglobin A1c   -USPSTF grade A and B recommendations reviewed with patient; age-appropriate recommendations, preventive care, screening tests, etc discussed and encouraged; healthy living encouraged; see AVS for patient education given to patient -Discussed importance of 150 minutes of physical activity weekly, eat two servings of fish weekly, eat one serving of tree nuts ( cashews, pistachios, pecans, almonds.Marland Kitchen) every  other day, eat 6 servings of fruit/vegetables daily and drink plenty of water and avoid sweet beverages.   -Reviewed Health Maintenance: Yes.

## 2023-07-14 ENCOUNTER — Ambulatory Visit (INDEPENDENT_AMBULATORY_CARE_PROVIDER_SITE_OTHER): Payer: Medicare HMO | Admitting: Family Medicine

## 2023-07-14 ENCOUNTER — Other Ambulatory Visit: Payer: Self-pay | Admitting: Family Medicine

## 2023-07-14 ENCOUNTER — Encounter: Payer: Self-pay | Admitting: Family Medicine

## 2023-07-14 VITALS — BP 118/68 | HR 92 | Resp 16 | Ht 60.0 in | Wt 130.0 lb

## 2023-07-14 DIAGNOSIS — Z780 Asymptomatic menopausal state: Secondary | ICD-10-CM

## 2023-07-14 DIAGNOSIS — Z23 Encounter for immunization: Secondary | ICD-10-CM | POA: Diagnosis not present

## 2023-07-14 DIAGNOSIS — Z1211 Encounter for screening for malignant neoplasm of colon: Secondary | ICD-10-CM

## 2023-07-14 DIAGNOSIS — R739 Hyperglycemia, unspecified: Secondary | ICD-10-CM | POA: Diagnosis not present

## 2023-07-14 DIAGNOSIS — I10 Essential (primary) hypertension: Secondary | ICD-10-CM

## 2023-07-14 DIAGNOSIS — Z0000 Encounter for general adult medical examination without abnormal findings: Secondary | ICD-10-CM

## 2023-07-14 DIAGNOSIS — M8580 Other specified disorders of bone density and structure, unspecified site: Secondary | ICD-10-CM

## 2023-07-14 NOTE — Patient Instructions (Signed)
Please get Shingrix and Tdap vaccines at local pharmacy

## 2023-07-15 LAB — HEMOGLOBIN A1C
Hgb A1c MFr Bld: 6.4 %{Hb} — ABNORMAL HIGH (ref <5.7)
Mean Plasma Glucose: 137 mg/dL
eAG (mmol/L): 7.6 mmol/L

## 2023-07-19 ENCOUNTER — Other Ambulatory Visit: Payer: Self-pay | Admitting: Family Medicine

## 2023-07-19 DIAGNOSIS — J301 Allergic rhinitis due to pollen: Secondary | ICD-10-CM

## 2023-07-19 DIAGNOSIS — I10 Essential (primary) hypertension: Secondary | ICD-10-CM

## 2023-09-17 DIAGNOSIS — Z0100 Encounter for examination of eyes and vision without abnormal findings: Secondary | ICD-10-CM | POA: Diagnosis not present

## 2023-10-05 ENCOUNTER — Encounter: Payer: Self-pay | Admitting: Family Medicine

## 2023-10-05 ENCOUNTER — Ambulatory Visit (INDEPENDENT_AMBULATORY_CARE_PROVIDER_SITE_OTHER): Payer: Medicare HMO | Admitting: Family Medicine

## 2023-10-05 VITALS — BP 110/72 | HR 102 | Temp 98.3°F | Resp 18 | Ht 60.0 in | Wt 134.3 lb

## 2023-10-05 DIAGNOSIS — I10 Essential (primary) hypertension: Secondary | ICD-10-CM

## 2023-10-05 DIAGNOSIS — F39 Unspecified mood [affective] disorder: Secondary | ICD-10-CM | POA: Diagnosis not present

## 2023-10-05 DIAGNOSIS — E559 Vitamin D deficiency, unspecified: Secondary | ICD-10-CM

## 2023-10-05 DIAGNOSIS — E785 Hyperlipidemia, unspecified: Secondary | ICD-10-CM

## 2023-10-05 DIAGNOSIS — I700 Atherosclerosis of aorta: Secondary | ICD-10-CM

## 2023-10-05 DIAGNOSIS — R739 Hyperglycemia, unspecified: Secondary | ICD-10-CM | POA: Diagnosis not present

## 2023-10-05 DIAGNOSIS — F411 Generalized anxiety disorder: Secondary | ICD-10-CM

## 2023-10-05 DIAGNOSIS — J410 Simple chronic bronchitis: Secondary | ICD-10-CM

## 2023-10-05 DIAGNOSIS — J301 Allergic rhinitis due to pollen: Secondary | ICD-10-CM

## 2023-10-05 DIAGNOSIS — M62830 Muscle spasm of back: Secondary | ICD-10-CM

## 2023-10-05 MED ORDER — MONTELUKAST SODIUM 10 MG PO TABS
10.0000 mg | ORAL_TABLET | Freq: Every day | ORAL | 1 refills | Status: DC
Start: 2023-10-05 — End: 2024-04-13

## 2023-10-05 MED ORDER — LOSARTAN POTASSIUM 25 MG PO TABS
25.0000 mg | ORAL_TABLET | Freq: Every day | ORAL | 0 refills | Status: DC
Start: 2023-10-05 — End: 2024-01-12

## 2023-10-05 MED ORDER — CYCLOBENZAPRINE HCL 5 MG PO TABS
5.0000 mg | ORAL_TABLET | Freq: Three times a day (TID) | ORAL | 0 refills | Status: DC | PRN
Start: 2023-10-05 — End: 2024-04-13

## 2023-10-05 MED ORDER — BUSPIRONE HCL 5 MG PO TABS
5.0000 mg | ORAL_TABLET | Freq: Two times a day (BID) | ORAL | 1 refills | Status: DC | PRN
Start: 2023-10-05 — End: 2024-04-13

## 2023-10-05 MED ORDER — ARIPIPRAZOLE 5 MG PO TABS
5.0000 mg | ORAL_TABLET | Freq: Every evening | ORAL | 1 refills | Status: DC
Start: 2023-10-05 — End: 2024-04-13

## 2023-10-05 MED ORDER — ROSUVASTATIN CALCIUM 20 MG PO TABS
20.0000 mg | ORAL_TABLET | Freq: Every day | ORAL | 1 refills | Status: DC
Start: 2023-10-05 — End: 2024-04-13

## 2023-10-05 MED ORDER — AZELASTINE HCL 0.1 % NA SOLN
2.0000 | Freq: Two times a day (BID) | NASAL | 1 refills | Status: DC
Start: 2023-10-05 — End: 2024-04-13

## 2023-10-05 NOTE — Progress Notes (Signed)
Name: Kristine Lowe   MRN: [id number]    DOB: 12-Dec-1955   Date:10/05/2023       Progress Note  Subjective  Chief Complaint  Chief Complaint  Patient presents with   Medical Management of Chronic Issues    HPI  History of kidney stones: last visit to Urologist 02/2021 , she has small left kidney stone and was advised follow up prn . She denies macroscopic hematuria or dysuria   Vulva irritation: improved with topical steroids, no problems at this time    Snoring: she states she snores and has noticed daytime somnolence. ESS done and it is 14 and we sent order for sleep study back in Feb, she does not want to have the study done    Mood Disorder:  long history of mood changes, she has tried multiple medications and currently on  Abilify 5 mg and Buspar BID , sometimes able to skip days  . She cannot take Zoloft because it caused her to shoplift in the past . Citalopram did not work in the past . She is doing well at this time   Pre-diabetes: A1C has been in the pre diabetes range for a long time. she gets sleepy after a high carb diet, starts at about 30 minutes after a meal and lasts for about 1.5 hours.  Denies polyphagia, polydipsia or polyuria. A1C spiked to 6.6 % but improved on her last check - down to 6.4 %    HTN: bp is towards low end of normal, gets dizzy intermittently, we will decrease dose of losartan to 25 mg daily and she will return for bp check with CMA.  no chest pain or palpitation   Dyslipidemia/Atherosclerosis of aorta : Last LDL was very high still at 195  she is doing well on crestor, last LDL down to 94, currently taking 20 mg. Last LDL improved   Vitamin D deficiency: continue otc supplementation    Chronic bronchitis:  She smokes cigarettes occasionally  and cutting down on vaping also.   She denies a cough, no wheezing or sob at this time She also takes singulair since she has RAD, she states humidity causes some SOB. She uses Ventolin prn. She is not  ready to quit smoking at this time   Back pain with intermittent muscle spasms: taking flexeril prn only but needs a refill today       Patient Active Problem List   Diagnosis Date Noted   Mood disorder (HCC) 11/04/2022   Atherosclerosis of aorta (HCC) 11/04/2022   Hypertension, benign 11/04/2022   Leg pain 04/08/2020   Dyslipidemia 06/29/2019   History of gestational diabetes 02/13/2017   Vitamin D deficiency 02/15/2016   Obstructive apnea 02/15/2016   Hyperglycemia 02/15/2016   Abnormal LFTs 02/15/2016   Dermatitis, eczematoid 02/15/2016   Generalized anxiety disorder 10/11/2015   Simple chronic bronchitis (HCC) 10/11/2015   Chronic back pain 10/11/2015   Chronic constipation 10/11/2015   Reflux esophagitis 05/01/2007    Past Surgical History:  Procedure Laterality Date   BREAST BIOPSY Right 04/15/2023   u/s guided bx, 12:00, VENUS clip-benign   BREAST BIOPSY Right 04/15/2023   [place] RT BREAST BX W LOC DEV 1ST LESION IMG BX SPEC US GUIDE 04/15/2023 [facility]   BREAST EXCISIONAL BIOPSY Left 1996   neg    Family History  Problem Relation Age of Onset   Breast cancer Mother 2   Alzheimer's disease Mother    Kidney disease Mother    Breast cancer  Maternal Aunt 60   Breast cancer Paternal Grandmother    Polycythemia Maternal Grandmother     Social History   Tobacco Use   Smoking status: Some Days    Current packs/day: 0.25    Average packs/day: 0.3 packs/day for 38.0 years (9.5 ttl pk-yrs)    Types: E-cigarettes, Cigarettes   Smokeless tobacco: Never  Substance Use Topics   Alcohol use: No    Alcohol/week: 0.0 standard drinks of alcohol     Current Outpatient Medications:    albuterol (VENTOLIN HFA) 108 (90 Base) MCG/ACT inhaler, INHALE 2 PUFFS BY MOUTH EVERY 6 HOURS AS NEEDED FOR WHEEZING OR SHORTNESS OF BREATH, Disp: 18 g, Rfl: 1   ARIPiprazole (ABILIFY) 5 MG tablet, Take 1 tablet (5 mg total) by mouth every evening., Disp: 90 tablet, Rfl: 1    azelastine (ASTELIN) 0.1 % nasal spray, Place 2 sprays into both nostrils 2 (two) times daily. Use in each nostril as directed, Disp: 30 mL, Rfl: 1   busPIRone (BUSPAR) 5 MG tablet, Take 1 tablet (5 mg total) by mouth 2 (two) times daily as needed., Disp: 180 tablet, Rfl: 1   Cholecalciferol (VITAMIN D) 50 MCG (2000 UT) CAPS, Take 1 capsule (2,000 Units total) by mouth daily., Disp: 30 capsule, Rfl: 0   cyclobenzaprine (FLEXERIL) 5 MG tablet, Take 1 tablet (5 mg total) by mouth 3 (three) times daily as needed for muscle spasms., Disp: 90 tablet, Rfl: 0   EQ ALLERGY RELIEF 10 MG tablet, Take 1 tablet by mouth once daily, Disp: 90 tablet, Rfl: 0   fluticasone (FLONASE) 50 MCG/ACT nasal spray, Place 2 sprays into both nostrils daily., Disp: 16 g, Rfl: 1   losartan (COZAAR) 50 MG tablet, Take 1 tablet by mouth once daily, Disp: 90 tablet, Rfl: 0   meclizine (ANTIVERT) 12.5 MG tablet, Take 1 tablet (12.5 mg total) by mouth 3 (three) times daily as needed for dizziness., Disp: 30 tablet, Rfl: 0   montelukast (SINGULAIR) 10 MG tablet, TAKE 1 TABLET BY MOUTH AT BEDTIME, Disp: 90 tablet, Rfl: 0   rosuvastatin (CRESTOR) 20 MG tablet, Take 1 tablet (20 mg total) by mouth daily., Disp: 90 tablet, Rfl: 1  Allergies  Allergen Reactions   Penicillins Swelling   Simvastatin    Zoloft [Sertraline Hcl] Other (See Comments)    Shop lift    I personally reviewed active problem list, medication list, allergies, family history, social history with the patient/caregiver today.   ROS  Ten systems reviewed and is negative except as mentioned in HPI    Objective  Vitals:   10/05/23 1028  BP: 110/72  Pulse: (!) 102  Resp: 18  Temp: 98.3 F (36.8 C)  SpO2: 98%  Weight: 134 lb 4.8 oz (60.9 kg)  Height: 5' (1.524 m)    Body mass index is 26.23 kg/m.  Physical Exam  Constitutional: Patient appears well-developed and well-nourished.  No distress.  HEENT: head atraumatic, normocephalic, pupils equal and  reactive to light, neck supple Cardiovascular: Normal rate, regular rhythm and normal heart sounds.  No murmur heard. No BLE edema. Pulmonary/Chest: Effort normal and breath sounds normal. No respiratory distress. Abdominal: Soft.  There is no tenderness. Psychiatric: Patient has a normal mood and affect. behavior is normal. Judgment and thought content normal.   Recent Results (from the past 2160 hours)  Hemoglobin A1c     Status: Abnormal   Collection Time: 07/14/23 11:38 AM  Result Value Ref Range   Hgb A1c MFr Bld  6.4 (H) <5.7 % of total Hgb    Comment: For someone without known diabetes, a hemoglobin  A1c value between 5.7% and 6.4% is consistent with prediabetes and should be confirmed with a  follow-up test. . For someone with known diabetes, a value <7% indicates that their diabetes is well controlled. A1c targets should be individualized based on duration of diabetes, age, comorbid conditions, and other considerations. . This assay result is consistent with an increased risk of diabetes. . Currently, no consensus exists regarding use of hemoglobin A1c for diagnosis of diabetes for children. .    Mean Plasma Glucose 137 mg/dL   eAG (mmol/L) 7.6 mmol/L    Diabetic Foot Exam:     PHQ2/9:    10/05/2023   10:52 AM 07/14/2023   10:46 AM 06/11/2023   11:44 AM 04/28/2023   11:38 AM 11/04/2022   11:34 AM  Depression screen PHQ 2/9  Decreased Interest 0 0 0 0 0  Down, Depressed, Hopeless 0 0 0 0 0  PHQ - 2 Score 0 0 0 0 0  Altered sleeping 0 0  0 0  Tired, decreased energy 1 0  0 0  Change in appetite 0 0  0 0  Feeling bad or failure about yourself  0 0  0 0  Trouble concentrating 0 0  0 0  Moving slowly or fidgety/restless 0 0  0 0  Suicidal thoughts 0 0  0 0  PHQ-9 Score 1 0  0 0  Difficult doing work/chores Not difficult at all        phq 9 is negative   Fall Risk:    10/05/2023   10:29 AM 07/14/2023   10:46 AM 06/11/2023   11:41 AM 04/28/2023   11:38 AM  11/04/2022   11:34 AM  Fall Risk   Falls in the past year? 0 0 0 0 0  Number falls in past yr: 0 0 0  0  Injury with Fall? 0 0 0  0  Risk for fall due to :  No Fall Risks No Fall Risks No Fall Risks No Fall Risks  Follow up Falls evaluation completed Falls prevention discussed Education provided;Falls prevention discussed Falls prevention discussed Falls prevention discussed     Assessment & Plan    1. Mood disorder (HCC) (Primary)  - ARIPiprazole (ABILIFY) 5 MG tablet; Take 1 tablet (5 mg total) by mouth every evening.  Dispense: 90 tablet; Refill: 1  2. Atherosclerosis of aorta (HCC)  On statin therapy   3. Simple chronic bronchitis (HCC)  Stable, discussed tobacco cessation   4. Hyperglycemia  Advised to cut down on sweets   5. Vitamin D deficiency  Continue supplementation  6. Hypertension, benign  - losartan (COZAAR) 25 MG tablet; Take 1 tablet (25 mg total) by mouth daily.  Dispense: 90 tablet; Refill: 0  7. Dyslipidemia  - rosuvastatin (CRESTOR) 20 MG tablet; Take 1 tablet (20 mg total) by mouth daily.  Dispense: 90 tablet; Refill: 1  8. GAD (generalized anxiety disorder)  - busPIRone (BUSPAR) 5 MG tablet; Take 1 tablet (5 mg total) by mouth 2 (two) times daily as needed.  Dispense: 180 tablet; Refill: 1  9. Seasonal allergic rhinitis due to pollen  - azelastine (ASTELIN) 0.1 % nasal spray; Place 2 sprays into both nostrils 2 (two) times daily. Use in each nostril as directed  Dispense: 30 mL; Refill: 1 - montelukast (SINGULAIR) 10 MG tablet; Take 1 tablet (10 mg total) by mouth at  bedtime.  Dispense: 90 tablet; Refill: 1  10. Muscle spasm of back  - cyclobenzaprine (FLEXERIL) 5 MG tablet; Take 1 tablet (5 mg total) by mouth 3 (three) times daily as needed for muscle spasms.  Dispense: 90 tablet; Refill: 0

## 2023-10-17 ENCOUNTER — Other Ambulatory Visit: Payer: Self-pay | Admitting: Family Medicine

## 2023-10-17 DIAGNOSIS — I10 Essential (primary) hypertension: Secondary | ICD-10-CM

## 2023-10-19 ENCOUNTER — Ambulatory Visit: Payer: Medicare HMO

## 2023-10-20 ENCOUNTER — Ambulatory Visit: Payer: Medicare HMO

## 2023-10-20 DIAGNOSIS — I10 Essential (primary) hypertension: Secondary | ICD-10-CM

## 2023-10-20 NOTE — Progress Notes (Signed)
 Patient is in office today for a nurse visit for Blood Pressure Check. Patient blood pressure was 114/70, Patient No chest pain, No shortness of breath, No dyspnea on exertion, No orthopnea, No paroxysmal nocturnal dyspnea, No edema, No palpitations, No syncope. Per last visit: HTN: bp is towards low end of normal, gets dizzy intermittently, we will decrease dose of losartan  to 25 mg daily and she will return for bp check with CMA

## 2023-12-30 ENCOUNTER — Encounter: Payer: Self-pay | Admitting: Family Medicine

## 2023-12-30 ENCOUNTER — Ambulatory Visit
Admission: RE | Admit: 2023-12-30 | Discharge: 2023-12-30 | Disposition: A | Discharge status: Home / Self Care | Payer: Medicare HMO | Source: Ambulatory Visit | Attending: Family Medicine | Admitting: Family Medicine

## 2023-12-30 DIAGNOSIS — R928 Other abnormal and inconclusive findings on diagnostic imaging of breast: Secondary | ICD-10-CM

## 2023-12-30 DIAGNOSIS — Z780 Asymptomatic menopausal state: Secondary | ICD-10-CM | POA: Insufficient documentation

## 2023-12-30 DIAGNOSIS — M8580 Other specified disorders of bone density and structure, unspecified site: Secondary | ICD-10-CM | POA: Diagnosis present

## 2024-01-12 ENCOUNTER — Other Ambulatory Visit: Payer: Self-pay | Admitting: Family Medicine

## 2024-01-12 DIAGNOSIS — I10 Essential (primary) hypertension: Secondary | ICD-10-CM

## 2024-01-12 MED ORDER — LOSARTAN POTASSIUM 25 MG PO TABS
25.0000 mg | ORAL_TABLET | Freq: Every day | ORAL | 0 refills | Status: DC
Start: 2024-01-12 — End: 2024-04-13

## 2024-04-13 ENCOUNTER — Ambulatory Visit (INDEPENDENT_AMBULATORY_CARE_PROVIDER_SITE_OTHER): Payer: Self-pay | Admitting: Family Medicine

## 2024-04-13 ENCOUNTER — Encounter: Payer: Self-pay | Admitting: Family Medicine

## 2024-04-13 VITALS — BP 108/68 | HR 61 | Resp 16 | Ht 60.0 in | Wt 138.2 lb

## 2024-04-13 DIAGNOSIS — J301 Allergic rhinitis due to pollen: Secondary | ICD-10-CM

## 2024-04-13 DIAGNOSIS — F39 Unspecified mood [affective] disorder: Secondary | ICD-10-CM | POA: Diagnosis not present

## 2024-04-13 DIAGNOSIS — R739 Hyperglycemia, unspecified: Secondary | ICD-10-CM | POA: Diagnosis not present

## 2024-04-13 DIAGNOSIS — J410 Simple chronic bronchitis: Secondary | ICD-10-CM

## 2024-04-13 DIAGNOSIS — I10 Essential (primary) hypertension: Secondary | ICD-10-CM

## 2024-04-13 DIAGNOSIS — F411 Generalized anxiety disorder: Secondary | ICD-10-CM

## 2024-04-13 DIAGNOSIS — E785 Hyperlipidemia, unspecified: Secondary | ICD-10-CM

## 2024-04-13 DIAGNOSIS — Z79899 Other long term (current) drug therapy: Secondary | ICD-10-CM

## 2024-04-13 DIAGNOSIS — I700 Atherosclerosis of aorta: Secondary | ICD-10-CM

## 2024-04-13 DIAGNOSIS — M62830 Muscle spasm of back: Secondary | ICD-10-CM

## 2024-04-13 LAB — POCT GLYCOSYLATED HEMOGLOBIN (HGB A1C): Hemoglobin A1C: 6.3 % — AB (ref 4.0–5.6)

## 2024-04-13 MED ORDER — MONTELUKAST SODIUM 10 MG PO TABS: 10.0000 mg | ORAL_TABLET | Freq: Every day | ORAL | 1 refills | Status: DC

## 2024-04-13 MED ORDER — CYCLOBENZAPRINE HCL 5 MG PO TABS: 5.0000 mg | ORAL_TABLET | Freq: Three times a day (TID) | ORAL | 0 refills | Status: DC | PRN

## 2024-04-13 MED ORDER — ROSUVASTATIN CALCIUM 20 MG PO TABS
20.0000 mg | ORAL_TABLET | Freq: Every day | ORAL | 1 refills | Status: AC
Start: 2024-04-13 — End: ?

## 2024-04-13 MED ORDER — ARIPIPRAZOLE 5 MG PO TABS: 5.0000 mg | ORAL_TABLET | Freq: Every evening | ORAL | 1 refills | Status: DC

## 2024-04-13 MED ORDER — BUSPIRONE HCL 5 MG PO TABS: 5.0000 mg | ORAL_TABLET | Freq: Two times a day (BID) | ORAL | 0 refills | Status: DC | PRN

## 2024-04-13 MED ORDER — AZELASTINE HCL 0.1 % NA SOLN: 2.0000 | Freq: Two times a day (BID) | NASAL | 1 refills | Status: DC

## 2024-04-13 MED ORDER — LOSARTAN POTASSIUM 25 MG PO TABS: 12.5000 mg | ORAL_TABLET | Freq: Every day | ORAL | 1 refills | Status: DC

## 2024-04-13 NOTE — Progress Notes (Signed)
 Name: Kristine Lowe   MRN: [id number]    DOB: 1956-08-19   Date:04/13/2024       Progress Note  Subjective  Chief Complaint  Chief Complaint  Patient presents with   Medical Management of Chronic Issues   History of Present Illness  Snoring: she states she snores and has noticed daytime somnolence. ESS done and it is 14 in the past but not willing to get sleep study    Mood Disorder:  long history of mood changes, she has tried multiple medications and currently on  Abilify  5 mg and Buspar  BID , sometimes able to skip days. She cannot take Zoloft because it caused her to shoplift in the past . Citalopram  did not work in the past . She denies any recent episodes. Stable   Pre-diabetes: A1C has been in the pre diabetes range for a long time. she gets sleepy after a high carb diet, starts at about 30 minutes after a meal and lasts for about 1.5 hours.  Denies polyphagia, polydipsia or polyuria. A1C spiked to 6.6 % but improved on her last check - down to 6.3%    HTN: bp is towards low end of normal, since we decrease dose of losartan  no recent episodes of dizziness but bp is still low and we will decrease dose of losartan  again to  12.5 mg mg daily and she will return for bp check with CMA.  no chest pain or palpitation   Dyslipidemia/Atherosclerosis of aorta :continue statin therapy no side effects    Vitamin D  deficiency: continue otc supplementation    Chronic bronchitis:  She smokes cigarettes occasionally  and cutting down on vaping also.   She denies a cough, no wheezing or sob at this time She also takes singulair  since she has RAD, she states humidity causes some SOB. She uses Ventolin  prn. She is still not ready to quit smoking    Back pain with intermittent muscle spasms: taking flexeril  prn only but needs a refill today    Patient Active Problem List   Diagnosis Date Noted   Mood disorder (HCC) 11/04/2022   Atherosclerosis of aorta (HCC) 11/04/2022   Hypertension,  benign 11/04/2022   Leg pain 04/08/2020   Dyslipidemia 06/29/2019   History of gestational diabetes 02/13/2017   Vitamin D  deficiency 02/15/2016   Obstructive apnea 02/15/2016   Hyperglycemia 02/15/2016   Abnormal LFTs 02/15/2016   Dermatitis, eczematoid 02/15/2016   Generalized anxiety disorder 10/11/2015   Simple chronic bronchitis (HCC) 10/11/2015   Chronic back pain 10/11/2015   Chronic constipation 10/11/2015   Reflux esophagitis 05/01/2007    Past Surgical History:  Procedure Laterality Date   BREAST BIOPSY Right 04/15/2023   u/s guided bx, 12:00, VENUS clip-benign   BREAST BIOPSY Right 04/15/2023   US  RT BREAST BX W LOC DEV 1ST LESION IMG BX SPEC US  GUIDE 04/15/2023 [facility]   BREAST EXCISIONAL BIOPSY Left 1996   neg    Family History  Problem Relation Age of Onset   Breast cancer Mother 92   Alzheimer's disease Mother    Kidney disease Mother    Breast cancer Maternal Aunt 75   Breast cancer Paternal Grandmother    Polycythemia Maternal Grandmother     Social History   Tobacco Use   Smoking status: Some Days    Current packs/day: 0.25    Average packs/day: 0.3 packs/day for 38.0 years (9.5 ttl pk-yrs)    Types: E-cigarettes, Cigarettes   Smokeless tobacco: Never  Substance Use  Topics   Alcohol use: No    Alcohol/week: 0.0 standard drinks of alcohol     Current Outpatient Medications:    albuterol  (VENTOLIN  HFA) 108 (90 Base) MCG/ACT inhaler, INHALE 2 PUFFS BY MOUTH EVERY 6 HOURS AS NEEDED FOR WHEEZING OR SHORTNESS OF BREATH, Disp: 18 g, Rfl: 1   ARIPiprazole  (ABILIFY ) 5 MG tablet, Take 1 tablet (5 mg total) by mouth every evening., Disp: 90 tablet, Rfl: 1   azelastine  (ASTELIN ) 0.1 % nasal spray, Place 2 sprays into both nostrils 2 (two) times daily. Use in each nostril as directed, Disp: 30 mL, Rfl: 1   busPIRone  (BUSPAR ) 5 MG tablet, Take 1 tablet (5 mg total) by mouth 2 (two) times daily as needed., Disp: 180 tablet, Rfl: 1   Cholecalciferol  (VITAMIN D ) 50 MCG (2000 UT) CAPS, Take 1 capsule (2,000 Units total) by mouth daily., Disp: 30 capsule, Rfl: 0   cyclobenzaprine  (FLEXERIL ) 5 MG tablet, Take 1 tablet (5 mg total) by mouth 3 (three) times daily as needed for muscle spasms., Disp: 90 tablet, Rfl: 0   EQ ALLERGY RELIEF 10 MG tablet, Take 1 tablet by mouth once daily, Disp: 90 tablet, Rfl: 0   fluticasone  (FLONASE ) 50 MCG/ACT nasal spray, Place 2 sprays into both nostrils daily., Disp: 16 g, Rfl: 1   losartan  (COZAAR ) 25 MG tablet, Take 1 tablet (25 mg total) by mouth daily., Disp: 90 tablet, Rfl: 0   meclizine  (ANTIVERT ) 12.5 MG tablet, Take 1 tablet (12.5 mg total) by mouth 3 (three) times daily as needed for dizziness., Disp: 30 tablet, Rfl: 0   montelukast  (SINGULAIR ) 10 MG tablet, Take 1 tablet (10 mg total) by mouth at bedtime., Disp: 90 tablet, Rfl: 1   rosuvastatin  (CRESTOR ) 20 MG tablet, Take 1 tablet (20 mg total) by mouth daily., Disp: 90 tablet, Rfl: 1  Allergies  Allergen Reactions   Penicillins Swelling   Simvastatin    Zoloft [Sertraline Hcl] Other (See Comments)    Shop lift    I personally reviewed active problem list, medication list, allergies with the patient/caregiver today.   ROS  Ten systems reviewed and is negative except as mentioned in HPI    Objective Physical Exam Constitutional: Patient appears well-developed and well-nourished.  No distress.  HEENT: head atraumatic, normocephalic, pupils equal and reactive to light, neck supple Cardiovascular: Normal rate, regular rhythm and normal heart sounds.  No murmur heard. No BLE edema. Pulmonary/Chest: Effort normal and breath sounds normal. No respiratory distress. Abdominal: Soft.  There is no tenderness. Psychiatric: Patient has a normal mood and affect. behavior is normal. Judgment and thought content normal.   Vitals:   04/13/24 1039  BP: 108/68  Pulse: 61  Resp: 16  SpO2: 99%  Weight: 138 lb 3.2 oz (62.7 kg)  Height: 5' (1.524 m)     Body mass index is 26.99 kg/m.  Recent Results (from the past 2160 hours)  POCT glycosylated hemoglobin (Hb A1C)     Status: Abnormal   Collection Time: 04/13/24 10:49 AM  Result Value Ref Range   Hemoglobin A1C 6.3 (A) 4.0 - 5.6 %   HbA1c POC (<> result, manual entry)     HbA1c, POC (prediabetic range)     HbA1c, POC (controlled diabetic range)       PHQ2/9:    04/13/2024   10:33 AM 10/05/2023   10:52 AM 07/14/2023   10:46 AM 06/11/2023   11:44 AM 04/28/2023   11:38 AM  Depression screen PHQ 2/9  Decreased Interest 0 0 0 0 0  Down, Depressed, Hopeless 0 0 0 0 0  PHQ - 2 Score 0 0 0 0 0  Altered sleeping  0 0  0  Tired, decreased energy  1 0  0  Change in appetite  0 0  0  Feeling bad or failure about yourself   0 0  0  Trouble concentrating  0 0  0  Moving slowly or fidgety/restless  0 0  0  Suicidal thoughts  0 0  0  PHQ-9 Score  1 0  0  Difficult doing work/chores  Not difficult at all       phq 9 is negative  Fall Risk:    04/13/2024   10:33 AM 10/05/2023   10:29 AM 07/14/2023   10:46 AM 06/11/2023   11:41 AM 04/28/2023   11:38 AM  Fall Risk   Falls in the past year? 0 0 0 0 0  Number falls in past yr: 0 0 0 0   Injury with Fall? 0 0 0 0   Risk for fall due to : No Fall Risks  No Fall Risks No Fall Risks No Fall Risks  Follow up Falls evaluation completed Falls evaluation completed Falls prevention discussed Education provided;Falls prevention discussed Falls prevention discussed     Assessment & Plan  1. Mood disorder (HCC) (Primary)  - ARIPiprazole  (ABILIFY ) 5 MG tablet; Take 1 tablet (5 mg total) by mouth every evening.  Dispense: 90 tablet; Refill: 1  2. Simple chronic bronchitis (HCC)  Still smoking a few cigarettes a day, not ready to quit , still has occasional cough   3. GAD (generalized anxiety disorder)  - busPIRone  (BUSPAR ) 5 MG tablet; Take 1 tablet (5 mg total) by mouth 2 (two) times daily as needed.  Dispense: 180 tablet; Refill: 0  4.  Hyperglycemia  - POCT glycosylated hemoglobin (Hb A1C)  5. Seasonal allergic rhinitis due to pollen  - azelastine  (ASTELIN ) 0.1 % nasal spray; Place 2 sprays into both nostrils 2 (two) times daily. Use in each nostril as directed  Dispense: 30 mL; Refill: 1 - montelukast  (SINGULAIR ) 10 MG tablet; Take 1 tablet (10 mg total) by mouth at bedtime.  Dispense: 90 tablet; Refill: 1  6. Muscle spasm of back  - cyclobenzaprine  (FLEXERIL ) 5 MG tablet; Take 1 tablet (5 mg total) by mouth 3 (three) times daily as needed for muscle spasms.  Dispense: 90 tablet; Refill: 0  7. Hypertension, benign  - losartan  (COZAAR ) 25 MG tablet; Take 0.5 tablets (12.5 mg total) by mouth daily.  Dispense: 45 tablet; Refill: 1  8. Dyslipidemia  - rosuvastatin  (CRESTOR ) 20 MG tablet; Take 1 tablet (20 mg total) by mouth daily.  Dispense: 90 tablet; Refill: 1 - Lipid panel  9. Atherosclerosis of aorta (HCC)  - Lipid panel  10. Long-term use of high-risk medication  - CBC with Differential/Platelet - Comprehensive metabolic panel with GFR

## 2024-05-20 ENCOUNTER — Other Ambulatory Visit: Payer: Self-pay | Admitting: Family Medicine

## 2024-05-20 DIAGNOSIS — Z1231 Encounter for screening mammogram for malignant neoplasm of breast: Secondary | ICD-10-CM

## 2024-06-16 ENCOUNTER — Ambulatory Visit (INDEPENDENT_AMBULATORY_CARE_PROVIDER_SITE_OTHER)

## 2024-06-16 DIAGNOSIS — Z0000 Encounter for general adult medical examination without abnormal findings: Secondary | ICD-10-CM

## 2024-06-16 NOTE — Progress Notes (Signed)
 Subjective:   Kristine Lowe is a 68 y.o. who presents for a Medicare Wellness preventive visit.  As a reminder, Annual Wellness Visits don't include a physical exam, and some assessments may be limited, especially if this visit is performed virtually. We may recommend an in-person follow-up visit with your provider if needed.  Visit Complete: Virtual I connected with  Kristine Lowe on 06/16/24 by a audio enabled telemedicine application and verified that I am speaking with the correct person using two identifiers.  Patient Location: Home  Provider Location: Home Office  I discussed the limitations of evaluation and management by telemedicine. The patient expressed understanding and agreed to proceed.  Vital Signs: Because this visit was a virtual/telehealth visit, some criteria may be missing or patient reported. Any vitals not documented were not able to be obtained and vitals that have been documented are patient reported.  VideoDeclined- This patient declined Librarian, academic. Therefore the visit was completed with audio only.  Persons Participating in Visit: Patient.  AWV Questionnaire: No: Patient Medicare AWV questionnaire was not completed prior to this visit.  Cardiac Risk Factors include: advanced age (>9men, >57 women);dyslipidemia;hypertension;sedentary lifestyle     Objective:    There were no vitals filed for this visit. There is no height or weight on file to calculate BMI.     06/16/2024    9:35 AM 06/11/2023   11:46 AM 05/28/2021   10:53 AM 05/24/2020   11:16 AM 05/11/2019    5:03 AM 02/11/2017    7:27 PM 02/15/2016   11:05 AM  Advanced Directives  Does Patient Have a Medical Advance Directive? No No No No No No  No   Would patient like information on creating a medical advance directive? No - Patient declined  Yes (MAU/Ambulatory/Procedural Areas - Information given) No - Patient declined No - Patient declined  No -  Patient declined  No - patient declined information      Data saved with a previous flowsheet row definition    Current Medications (verified) Outpatient Encounter Medications as of 06/16/2024  Medication Sig   albuterol  (VENTOLIN  HFA) 108 (90 Base) MCG/ACT inhaler INHALE 2 PUFFS BY MOUTH EVERY 6 HOURS AS NEEDED FOR WHEEZING OR SHORTNESS OF BREATH   ARIPiprazole  (ABILIFY ) 5 MG tablet Take 1 tablet (5 mg total) by mouth every evening.   azelastine  (ASTELIN ) 0.1 % nasal spray Place 2 sprays into both nostrils 2 (two) times daily. Use in each nostril as directed   busPIRone  (BUSPAR ) 5 MG tablet Take 1 tablet (5 mg total) by mouth 2 (two) times daily as needed.   Cholecalciferol (VITAMIN D ) 50 MCG (2000 UT) CAPS Take 1 capsule (2,000 Units total) by mouth daily.   cyclobenzaprine  (FLEXERIL ) 5 MG tablet Take 1 tablet (5 mg total) by mouth 3 (three) times daily as needed for muscle spasms.   EQ ALLERGY RELIEF 10 MG tablet Take 1 tablet by mouth once daily   fluticasone  (FLONASE ) 50 MCG/ACT nasal spray Place 2 sprays into both nostrils daily.   losartan  (COZAAR ) 25 MG tablet Take 0.5 tablets (12.5 mg total) by mouth daily.   meclizine  (ANTIVERT ) 12.5 MG tablet Take 1 tablet (12.5 mg total) by mouth 3 (three) times daily as needed for dizziness.   rosuvastatin  (CRESTOR ) 20 MG tablet Take 1 tablet (20 mg total) by mouth daily.   montelukast  (SINGULAIR ) 10 MG tablet Take 1 tablet (10 mg total) by mouth at bedtime. (Patient not taking: Reported on  06/16/2024)   No facility-administered encounter medications on file as of 06/16/2024.    Allergies (verified) Penicillins, Simvastatin, and Zoloft [sertraline hcl]   History: Past Medical History:  Diagnosis Date   Allergy    Anxiety    Chronic obstructive airway disease (HCC)    Dizziness and giddiness    Esophageal reflux    Hypertension    Insomnia    Lumbago    Lumbosacral neuritis    Pure hypercholesterolemia    Past Surgical History:   Procedure Laterality Date   BREAST BIOPSY Right 04/15/2023   u/s guided bx, 12:00, VENUS clip-benign   BREAST BIOPSY Right 04/15/2023   US  RT BREAST BX W LOC DEV 1ST LESION IMG BX SPEC US  GUIDE 04/15/2023 [facility]   BREAST EXCISIONAL BIOPSY Left 1996   neg   Family History  Problem Relation Age of Onset   Breast cancer Mother 50   Alzheimer's disease Mother    Kidney disease Mother    Breast cancer Maternal Aunt 33   Breast cancer Paternal Grandmother    Polycythemia Maternal Grandmother    Social History   Socioeconomic History   Marital status: Married    Spouse name: Not on file   Number of children: 7   Years of education: Not on file   Highest education level: Some college, no degree  Occupational History   Occupation: member Teaching laboratory technician   Tobacco Use   Smoking status: Some Days    Current packs/day: 0.25    Average packs/day: 0.3 packs/day for 38.0 years (9.5 ttl pk-yrs)    Types: E-cigarettes, Cigarettes   Smokeless tobacco: Never  Vaping Use   Vaping status: Some Days  Substance and Sexual Activity   Alcohol use: No    Alcohol/week: 0.0 standard drinks of alcohol   Drug use: No   Sexual activity: Yes    Partners: Male  Other Topics Concern   Not on file  Social History Narrative   Married , has 7 grown children, working part time   Social Drivers of Corporate investment banker Strain: Low Risk  (06/16/2024)   Overall Financial Resource Strain (CARDIA)    Difficulty of Paying Living Expenses: Not hard at all  Food Insecurity: No Food Insecurity (06/16/2024)   Hunger Vital Sign    Worried About Running Out of Food in the Last Year: Never true    Ran Out of Food in the Last Year: Never true  Transportation Needs: No Transportation Needs (06/16/2024)   PRAPARE - Administrator, Civil Service (Medical): No    Lack of Transportation (Non-Medical): No  Physical Activity: Inactive (06/16/2024)   Exercise Vital Sign    Days of Exercise per  Week: 0 days    Minutes of Exercise per Session: 0 min  Stress: No Stress Concern Present (06/16/2024)   [employer] of Occupational Health - Occupational Stress Questionnaire    Feeling of Stress: Not at all  Social Connections: Socially Integrated (06/16/2024)   Social Connection and Isolation Panel    Frequency of Communication with Friends and Family: More than three times a week    Frequency of Social Gatherings with Friends and Family: More than three times a week    Attends Religious Services: More than 4 times per year    Active Member of [employer] or Organizations: Yes    Attends Engineer, structural: More than 4 times per year    Marital Status: Married    Tobacco Counseling Ready  to quit: Not Answered Counseling given: Not Answered    Clinical Intake:  Pre-visit preparation completed: Yes  Pain : No/denies pain     BMI - recorded: 27 Nutritional Status: BMI 25 -29 Overweight Nutritional Risks: None Diabetes: No  Lab Results  Component Value Date   HGBA1C 6.3 (A) 04/13/2024   HGBA1C 6.4 (H) 07/14/2023   HGBA1C 6.6 (H) 04/28/2023     How often do you need to have someone help you when you read instructions, pamphlets, or other written materials from your doctor or pharmacy?: 1 - Never  Interpreter Needed?: No  Information entered by :: JHONNIE DAS, LPN   Activities of Daily Living     06/16/2024    9:36 AM 07/14/2023   10:47 AM  In your present state of health, do you have any difficulty performing the following activities:  Hearing? 0 0  Vision? 0 0  Difficulty concentrating or making decisions? 0 0  Walking or climbing stairs? 0 0  Dressing or bathing? 0 0  Doing errands, shopping? 0 0  Preparing Food and eating ? N   Using the Toilet? N   In the past six months, have you accidently leaked urine? N   Do you have problems with loss of bowel control? N   Managing your Medications? N   Managing your Finances? N   Housekeeping or  managing your Housekeeping? N     Patient Care Team: Sowles, Krichna, MD as PCP - General (Family Medicine) [facility], [facility]  I have updated your Care Teams any recent Medical Services you may have received from other providers in the past year.     Assessment:   This is a routine wellness examination for [place].  Hearing/Vision screen Hearing Screening - Comments:: NO AIDS Vision Screening - Comments:: WEARS GLASSES ALL DAY- [facility]   Goals Addressed             This Visit's Progress    DIET - EAT MORE FRUITS AND VEGETABLES         Depression Screen     06/16/2024    9:34 AM 04/13/2024   10:33 AM 10/05/2023   10:52 AM 07/14/2023   10:46 AM 06/11/2023   11:44 AM 04/28/2023   11:38 AM 11/04/2022   11:34 AM  PHQ 2/9 Scores  PHQ - 2 Score 0 0 0 0 0 0 0  PHQ- 9 Score 0  1 0  0 0    Fall Risk     06/16/2024    9:36 AM 04/13/2024   10:33 AM 10/05/2023   10:29 AM 07/14/2023   10:46 AM 06/11/2023   11:41 AM  Fall Risk   Falls in the past year? 0 0 0 0 0  Number falls in past yr: 0 0 0 0 0  Injury with Fall? 0 0 0 0 0  Risk for fall due to : No Fall Risks No Fall Risks  No Fall Risks No Fall Risks  Follow up Falls evaluation completed;Falls prevention discussed Falls evaluation completed Falls evaluation completed Falls prevention discussed Education provided;Falls prevention discussed    MEDICARE RISK AT HOME:  Medicare Risk at Home Any stairs in or around the home?: Yes If so, are there any without handrails?: Yes Home free of loose throw rugs in walkways, pet beds, electrical cords, etc?: Yes Adequate lighting in your home to reduce risk of falls?: Yes Life alert?: No Use of a cane, walker or w/c?: No Grab  bars in the bathroom?: Yes Shower chair or bench in shower?: No Elevated toilet seat or a handicapped toilet?: Yes  TIMED UP AND GO:  Was the test performed?  No  Cognitive Function: 6CIT completed        06/16/2024    9:38 AM 06/11/2023    11:49 AM 06/03/2022   11:06 AM  6CIT Screen  What Year? 0 points 0 points 0 points  What month? 0 points 0 points 0 points  What time? 0 points 0 points 0 points  Count back from 20 0 points 0 points 0 points  Months in reverse 0 points 0 points 0 points  Repeat phrase 0 points 0 points 0 points  Total Score 0 points 0 points 0 points    Immunizations Immunization History  Administered Date(s) Administered   Fluad Quad(high Dose 65+) 07/26/2019, 06/26/2020, 06/28/2021, 06/30/2022   Fluad Trivalent(High Dose 65+) 07/14/2023   Moderna Sars-Covid-2 Vaccination 11/17/2019, 12/15/2019, 09/15/2020   Pneumococcal Conjugate-13 04/26/2019   Pneumococcal Polysaccharide-23 05/24/2020   Respiratory Syncytial Virus Vaccine,Recomb Aduvanted(Arexvy) 07/26/2022   Tdap 06/27/2014    Screening Tests Health Maintenance  Topic Date Due   Influenza Vaccine  05/06/2024   COVID-19 Vaccine (4 - 2025-26 season) 06/06/2024   Mammogram  06/30/2024   Zoster Vaccines- Shingrix  (1 of 2) 07/14/2024 (Originally 02/05/1975)   Fecal DNA (Cologuard)  04/13/2025 (Originally 02/06/1999)   DTaP/Tdap/Td (2 - Td or Tdap) 06/27/2024   Medicare Annual Wellness (AWV)  06/16/2025   DEXA SCAN  12/29/2028   Pneumococcal Vaccine: 50+ Years  Completed   Hepatitis C Screening  Completed   HPV VACCINES  Aged Out   Meningococcal B Vaccine  Aged Out    Health Maintenance Items Addressed: NEEDS SHINGRIX  & COVID; MAMMOGRAM SCHEDULED 9/30;DECLINES COLOGUARD; UP TO DATE ON BDS   Additional Screening:  Vision Screening: Recommended annual ophthalmology exams for early detection of glaucoma and other disorders of the eye. Is the patient up to date with their annual eye exam?  Yes  Who is the provider or what is the name of the office in which the patient attends annual eye exams? [facility]  Dental Screening: Recommended annual dental exams for proper oral hygiene  Community Resource Referral / Chronic Care Management: CRR  required this visit?  No   CCM required this visit?  No   Plan:    I have personally reviewed and noted the following in the patient's chart:   Medical and social history Use of alcohol, tobacco or illicit drugs  Current medications and supplements including opioid prescriptions. Patient is not currently taking opioid prescriptions. Functional ability and status Nutritional status Physical activity Advanced directives List of other physicians Hospitalizations, surgeries, and ER visits in previous 12 months Vitals Screenings to include cognitive, depression, and falls Referrals and appointments  In addition, I have reviewed and discussed with patient certain preventive protocols, quality metrics, and best practice recommendations. A written personalized care plan for preventive services as well as general preventive health recommendations were provided to patient.   Jhonnie GORMAN Das, LPN   [id number]   After Visit Summary: (MyChart) Due to this being a telephonic visit, the after visit summary with patients personalized plan was offered to patient via MyChart   Notes: Nothing significant to report at this time.

## 2024-06-16 NOTE — Patient Instructions (Addendum)
 Kristine Lowe,  Thank you for taking the time for your Medicare Wellness Visit. I appreciate your continued commitment to your health goals. Please review the care plan we discussed, and feel free to reach out if I can assist you further.  Medicare recommends these wellness visits once per year to help you and your care team stay ahead of potential health issues. These visits are designed to focus on prevention, allowing your provider to concentrate on managing your acute and chronic conditions during your regular appointments.  Please note that Annual Wellness Visits do not include a physical exam. Some assessments may be limited, especially if the visit was conducted virtually. If needed, we may recommend a separate in-person follow-up with your provider.  Ongoing Care Seeing your primary care provider every 3 to 6 months helps us  monitor your health and provide consistent, personalized care.   Referrals If a referral was made during today's visit and you haven't received any updates within two weeks, please contact the referred provider directly to check on the status.  Recommended Screenings:  Health Maintenance  Topic Date Due   Flu Shot  05/06/2024   COVID-19 Vaccine (4 - 2025-26 season) 06/06/2024   Breast Cancer Screening  06/30/2024   Zoster (Shingles) Vaccine (1 of 2) 07/14/2024*   Cologuard (Stool DNA test)  04/13/2025*   DTaP/Tdap/Td vaccine (2 - Td or Tdap) 06/27/2024   Medicare Annual Wellness Visit  06/16/2025   DEXA scan (bone density measurement)  12/29/2028   Pneumococcal Vaccine for age over 66  Completed   Hepatitis C Screening  Completed   HPV Vaccine  Aged Out   Meningitis B Vaccine  Aged Out  *Topic was postponed. The date shown is not the original due date.       06/16/2024    9:35 AM  Advanced Directives  Does Patient Have a Medical Advance Directive? No  Would patient like information on creating a medical advance directive? No - Patient declined    Advance Care Planning is important because it: Ensures you receive medical care that aligns with your values, goals, and preferences. Provides guidance to your family and loved ones, reducing the emotional burden of decision-making during critical moments.  Vision: Annual vision screenings are recommended for early detection of glaucoma, cataracts, and diabetic retinopathy. These exams can also reveal signs of chronic conditions such as diabetes and high blood pressure.  Dental: Annual dental screenings help detect early signs of oral cancer, gum disease, and other conditions linked to overall health, including heart disease and diabetes.  Please see the attached documents for additional preventive care recommendations.   NEXT AWV 06/22/25 @ 9:30 AM BY PHONE

## 2024-07-05 ENCOUNTER — Ambulatory Visit
Admission: RE | Admit: 2024-07-05 | Discharge: 2024-07-05 | Disposition: A | Discharge status: Home / Self Care | Source: Ambulatory Visit | Attending: Family Medicine | Admitting: Family Medicine

## 2024-07-05 DIAGNOSIS — Z1231 Encounter for screening mammogram for malignant neoplasm of breast: Secondary | ICD-10-CM | POA: Diagnosis not present

## 2024-07-29 NOTE — Progress Notes (Signed)
 Kristine Lowe                                          MRN: [id number]   07/29/2024   The [facility] Quality Team Specialist reviewed this patient medical record for the purposes of chart review for care gap closure. The following were reviewed: chart review for care gap closure-colorectal cancer screening.    [facility]BCI Quality Team

## 2024-10-17 ENCOUNTER — Encounter: Payer: Self-pay | Admitting: Family Medicine

## 2024-10-17 ENCOUNTER — Ambulatory Visit: Admitting: Family Medicine

## 2024-10-17 VITALS — BP 128/80 | HR 90 | Resp 16 | Ht 60.0 in | Wt 148.4 lb

## 2024-10-17 DIAGNOSIS — R350 Frequency of micturition: Secondary | ICD-10-CM

## 2024-10-17 DIAGNOSIS — F39 Unspecified mood [affective] disorder: Secondary | ICD-10-CM

## 2024-10-17 DIAGNOSIS — J410 Simple chronic bronchitis: Secondary | ICD-10-CM

## 2024-10-17 DIAGNOSIS — M8580 Other specified disorders of bone density and structure, unspecified site: Secondary | ICD-10-CM

## 2024-10-17 DIAGNOSIS — J301 Allergic rhinitis due to pollen: Secondary | ICD-10-CM

## 2024-10-17 DIAGNOSIS — E785 Hyperlipidemia, unspecified: Secondary | ICD-10-CM

## 2024-10-17 DIAGNOSIS — Z780 Asymptomatic menopausal state: Secondary | ICD-10-CM | POA: Insufficient documentation

## 2024-10-17 DIAGNOSIS — Z79899 Other long term (current) drug therapy: Secondary | ICD-10-CM

## 2024-10-17 DIAGNOSIS — M62830 Muscle spasm of back: Secondary | ICD-10-CM

## 2024-10-17 DIAGNOSIS — I700 Atherosclerosis of aorta: Secondary | ICD-10-CM

## 2024-10-17 DIAGNOSIS — Z23 Encounter for immunization: Secondary | ICD-10-CM

## 2024-10-17 DIAGNOSIS — R635 Abnormal weight gain: Secondary | ICD-10-CM

## 2024-10-17 DIAGNOSIS — R739 Hyperglycemia, unspecified: Secondary | ICD-10-CM

## 2024-10-17 DIAGNOSIS — E559 Vitamin D deficiency, unspecified: Secondary | ICD-10-CM

## 2024-10-17 LAB — POCT GLYCOSYLATED HEMOGLOBIN (HGB A1C): Hemoglobin A1C: 6.4 % — AB (ref 4.0–5.6)

## 2024-10-17 MED ORDER — MONTELUKAST SODIUM 10 MG PO TABS: 10.0000 mg | ORAL_TABLET | Freq: Every day | ORAL | 1 refills | Status: AC

## 2024-10-17 MED ORDER — ARIPIPRAZOLE 5 MG PO TABS: 5.0000 mg | ORAL_TABLET | Freq: Every evening | ORAL | 1 refills | Status: AC

## 2024-10-17 MED ORDER — AZELASTINE HCL 0.1 % NA SOLN: 2.0000 | Freq: Two times a day (BID) | NASAL | 1 refills | Status: AC

## 2024-10-17 MED ORDER — BACLOFEN 10 MG PO TABS: 10.0000 mg | ORAL_TABLET | Freq: Three times a day (TID) | ORAL | 0 refills | Status: AC | PRN

## 2024-10-17 MED ORDER — BUSPIRONE HCL 5 MG PO TABS: 5.0000 mg | ORAL_TABLET | Freq: Two times a day (BID) | ORAL | 0 refills | Status: AC | PRN

## 2024-10-17 MED ORDER — FLUTICASONE PROPIONATE 50 MCG/ACT NA SUSP: 2.0000 | Freq: Every day | NASAL | 1 refills | Status: AC

## 2024-10-17 NOTE — Progress Notes (Signed)
 Name: Kristine Lowe   MRN: [id number]    DOB: 1956-02-21   Date:10/17/2024       Progress Note  Subjective  Chief Complaint  Chief Complaint  Patient presents with   Medical Management of Chronic Issues   Discussed the use of AI scribe software for clinical note transcription with the patient, who gave verbal consent to proceed.  History of Present Illness Kristine Lowe is a 69 year old female with prediabetes who presents with urinary frequency and weight gain.  She has experienced increased urinary frequency for the past two to three months, characterized by increased urgency but no dysuria or rushing to the bathroom. Her recent A1c was 6.4%, slightly up from 6.3% six months ago. She denies extreme thirst. She has a personal history of gestational diabetes   She has gained 10 pounds since July despite eating only twice a day. There are no changes in bowel movements, and she is unaware of any thyroid  disease. She discontinued losartan  three months ago after her blood pressure was low.  She has a history of osteopenia and is not taking vitamin D  supplements. She also has dyslipidemia and atherosclerosis of the aorta but has stopped taking rosuvastatin . She is not taking any allergy medications despite having seasonal allergies.  She quit smoking four to five months ago after smoking since age 52, with a history of smoking up to half a pack a day. She denies using e-cigarettes. Since quitting, she has experienced mucus in her throat for the past two to three weeks, which worsens when lying down. No fever, chills, or heartburn, but she experiences shortness of breath when bending over.  Her current medications include Abilify  at night for mood disorders, Buspar  for anxiety as needed, and cyclobenzaprine  for back pain as needed. She has not been taking montelukast  or albuterol  recently.    Patient Active Problem List   Diagnosis Date Noted   Osteopenia after menopause  10/17/2024   Mood disorder 11/04/2022   Atherosclerosis of aorta 11/04/2022   Dyslipidemia 06/29/2019   History of gestational diabetes 02/13/2017   Vitamin D  deficiency 02/15/2016   Obstructive apnea 02/15/2016   Hyperglycemia 02/15/2016   Abnormal LFTs 02/15/2016   Dermatitis, eczematoid 02/15/2016   Generalized anxiety disorder 10/11/2015   Simple chronic bronchitis (HCC) 10/11/2015   Chronic back pain 10/11/2015   Chronic constipation 10/11/2015   GERD (gastroesophageal reflux disease) 05/01/2007    Past Surgical History:  Procedure Laterality Date   BREAST BIOPSY Right 04/15/2023   u/s guided bx, 12:00, VENUS clip-benign   BREAST BIOPSY Right 04/15/2023   US  RT BREAST BX W LOC DEV 1ST LESION IMG BX SPEC US  GUIDE 04/15/2023 [facility]   BREAST EXCISIONAL BIOPSY Left 1996   neg    Family History  Problem Relation Age of Onset   Breast cancer Mother 65   Alzheimer's disease Mother    Kidney disease Mother    Breast cancer Maternal Aunt 57   Breast cancer Paternal Grandmother    Polycythemia Maternal Grandmother     Social History   Tobacco Use   Smoking status: Former    Current packs/day: 0.00    Average packs/day: 0.3 packs/day for 55.6 years (13.9 ttl pk-yrs)    Types: Cigarettes    Start date: 10/17/1968    Quit date: 06/06/2024    Years since quitting: 0.3   Smokeless tobacco: Never  Substance Use Topics   Alcohol use: No    Alcohol/week: 0.0 standard drinks of  alcohol    Current Medications[1]  Allergies[2]  I personally reviewed active problem list, medication list, allergies, family history with the patient/caregiver today.   ROS  Ten systems reviewed and is negative except as mentioned in HPI    Objective Physical Exam VITALS: BP- 128/80 CONSTITUTIONAL: Patient appears well-developed and well-nourished. No distress. HEENT: Head atraumatic, normocephalic, neck supple. CARDIOVASCULAR: Normal rate, regular rhythm and normal heart sounds.  No murmur heard. No BLE edema. PULMONARY: Effort normal and breath sounds normal. No respiratory distress. ABDOMINAL: There is no tenderness or distention. MUSCULOSKELETAL: Normal gait. Without gross motor or sensory deficit. PSYCHIATRIC: Patient has a normal mood and affect. Behavior is normal. Judgment and thought content normal.  Vitals:   10/17/24 1042  BP: 128/80  Pulse: 90  Resp: 16  SpO2: 93%  Weight: 148 lb 6.4 oz (67.3 kg)  Height: 5' (1.524 m)    Body mass index is 28.98 kg/m.  Recent Results (from the past 2160 hours)  POCT glycosylated hemoglobin (Hb A1C)     Status: Abnormal   Collection Time: 10/17/24 10:53 AM  Result Value Ref Range   Hemoglobin A1C 6.4 (A) 4.0 - 5.6 %   HbA1c POC (<> result, manual entry)     HbA1c, POC (prediabetic range)     HbA1c, POC (controlled diabetic range)       PHQ2/9:    10/17/2024   10:42 AM 06/16/2024    9:34 AM 04/13/2024   10:33 AM 10/05/2023   10:52 AM 07/14/2023   10:46 AM  Depression screen PHQ 2/9  Decreased Interest 0 0 0 0 0  Down, Depressed, Hopeless 0 0 0 0 0  PHQ - 2 Score 0 0 0 0 0  Altered sleeping  0  0 0  Tired, decreased energy  0  1 0  Change in appetite  0  0 0  Feeling bad or failure about yourself   0  0 0  Trouble concentrating  0  0 0  Moving slowly or fidgety/restless  0  0 0  Suicidal thoughts  0  0 0  PHQ-9 Score  0   1  0   Difficult doing work/chores  Not difficult at all  Not difficult at all      Data saved with a previous flowsheet row definition    phq 9 is negative  Fall Risk:    10/17/2024   10:42 AM 06/16/2024    9:36 AM 04/13/2024   10:33 AM 10/05/2023   10:29 AM 07/14/2023   10:46 AM  Fall Risk   Falls in the past year? 0 0 0 0 0  Number falls in past yr: 0 0 0 0 0  Injury with Fall? 0 0  0  0  0   Risk for fall due to : No Fall Risks No Fall Risks No Fall Risks  No Fall Risks  Follow up Falls evaluation completed Falls evaluation completed;Falls prevention discussed Falls  evaluation completed Falls evaluation completed Falls prevention discussed     Data saved with a previous flowsheet row definition    Assessment & Plan Prediabetes with hyperglycemia and insulin resistance A1c at 6.4%, below diabetes threshold. Discussed potential diabetes diagnosis and treatment options, including metformin, but she prefers to wait until A1c exceeds 6.5%. - Ordered fructosamine test to assess average blood sugar levels over the past month.  Abnormal weight gain Possible insulin resistance contributing to weight gain. Discussed lifestyle modifications including diet and exercise. - Checked TSH  to rule out thyroid  dysfunction. - Encouraged dietary modifications and increased physical activity.  Urinary frequency Increased urinary frequency for 2-3 months. Differential includes diabetes symptoms or urinary tract infection. - Ordered urinalysis and urine culture to rule out infection.  Osteopenia after menopause Osteopenia with low fracture risk. Discussed importance of calcium  and vitamin D  intake. - Ensure adequate calcium  and vitamin D  intake through diet and supplements.  Vitamin D  deficiency Vitamin D  levels not checked recently. Discussed importance of vitamin D  supplementation, especially with osteopenia. - Ordered vitamin D  level test. - Recommended over-the-counter vitamin D  supplementation if levels are low.  Simple chronic bronchitis Recent cessation of smoking. Possible post-smoking effects or allergy-related symptoms. - Recommended Mucinex , saline spray, Flonase , and allergy medications. - Encouraged weight loss to improve respiratory symptoms.  Seasonal allergic rhinitis Symptoms of mucus in throat and possible allergy-related symptoms. - Recommended Flonase  nasal spray and over-the-counter allergy relief medications.  Dyslipidemia Previously on rosuvastatin , which she discontinued. Discussed potential need for resuming medication based on future lab  results. - Ordered lipid panel to assess current cholesterol levels.  Atherosclerosis of aorta  Mood disorder Currently taking Abilify  at night and Spar for anxiety as needed. - Continue current medications for mood disorder.  Muscle spasm of back Reports back pain and uses cyclobenzaprine  as needed. Discussed switching to baclofen  as an alternative muscle relaxant since it is the preferred drug on her formulary  - Switched from cyclobenzaprine  to baclofen  as needed for muscle spasms.  General health maintenance Declined colon cancer screening and tetanus vaccine. Discussed availability of tetanus vaccine at local pharmacy. - Encouraged tetanus vaccination at local pharmacy if desired.        [1]  Current Outpatient Medications:    albuterol  (VENTOLIN  HFA) 108 (90 Base) MCG/ACT inhaler, INHALE 2 PUFFS BY MOUTH EVERY 6 HOURS AS NEEDED FOR WHEEZING OR SHORTNESS OF BREATH, Disp: 18 g, Rfl: 1   ARIPiprazole  (ABILIFY ) 5 MG tablet, Take 1 tablet (5 mg total) by mouth every evening., Disp: 90 tablet, Rfl: 1   azelastine  (ASTELIN ) 0.1 % nasal spray, Place 2 sprays into both nostrils 2 (two) times daily. Use in each nostril as directed, Disp: 30 mL, Rfl: 1   busPIRone  (BUSPAR ) 5 MG tablet, Take 1 tablet (5 mg total) by mouth 2 (two) times daily as needed., Disp: 180 tablet, Rfl: 0   Cholecalciferol (VITAMIN D ) 50 MCG (2000 UT) CAPS, Take 1 capsule (2,000 Units total) by mouth daily., Disp: 30 capsule, Rfl: 0   cyclobenzaprine  (FLEXERIL ) 5 MG tablet, Take 1 tablet (5 mg total) by mouth 3 (three) times daily as needed for muscle spasms., Disp: 90 tablet, Rfl: 0   EQ ALLERGY RELIEF 10 MG tablet, Take 1 tablet by mouth once daily, Disp: 90 tablet, Rfl: 0   fluticasone  (FLONASE ) 50 MCG/ACT nasal spray, Place 2 sprays into both nostrils daily., Disp: 16 g, Rfl: 1   meclizine  (ANTIVERT ) 12.5 MG tablet, Take 1 tablet (12.5 mg total) by mouth 3 (three) times daily as needed for dizziness., Disp: 30  tablet, Rfl: 0   rosuvastatin  (CRESTOR ) 20 MG tablet, Take 1 tablet (20 mg total) by mouth daily., Disp: 90 tablet, Rfl: 1   montelukast  (SINGULAIR ) 10 MG tablet, Take 1 tablet (10 mg total) by mouth at bedtime. (Patient not taking: Reported on 10/17/2024), Disp: 90 tablet, Rfl: 1 [2]  Allergies Allergen Reactions   Penicillins Swelling   Simvastatin    Zoloft [Sertraline Hcl] Other (See Comments)    Shop  lift

## 2024-10-18 ENCOUNTER — Ambulatory Visit: Payer: Self-pay | Admitting: Family Medicine

## 2024-10-20 LAB — URINALYSIS, COMPLETE
Bilirubin Urine: NEGATIVE
Glucose, UA: NEGATIVE
Hyaline Cast: NONE SEEN /LPF
Ketones, ur: NEGATIVE
Nitrite: POSITIVE — AB
Specific Gravity, Urine: 1.016 (ref 1.001–1.035)
Squamous Epithelial / HPF: NONE SEEN /HPF
WBC, UA: >=60 /HPF — AB (ref 0–5)
pH: 6 (ref 5.0–8.0)

## 2024-10-20 LAB — CBC WITH DIFFERENTIAL/PLATELET
Absolute Lymphocytes: 1307 {cells}/uL (ref 850–3900)
Absolute Monocytes: 515 {cells}/uL (ref 200–950)
Basophils Absolute: 59 {cells}/uL (ref 0–200)
Basophils Relative: 0.9 %
Eosinophils Absolute: 92 {cells}/uL (ref 15–500)
Eosinophils Relative: 1.4 %
HCT: 44.1 % (ref 35.9–46.0)
Hemoglobin: 14 g/dL (ref 11.7–15.5)
MCH: 27.2 pg (ref 27.0–33.0)
MCHC: 31.7 g/dL (ref 31.6–35.4)
MCV: 85.6 fL (ref 81.4–101.7)
MPV: 10.5 fL (ref 7.5–12.5)
Monocytes Relative: 7.8 %
Neutro Abs: 4627 {cells}/uL (ref 1500–7800)
Neutrophils Relative %: 70.1 %
Platelets: 346 Thousand/uL (ref 140–400)
RBC: 5.15 Million/uL — ABNORMAL HIGH (ref 3.80–5.10)
RDW: 14.9 % (ref 11.0–15.0)
Total Lymphocyte: 19.8 %
WBC: 6.6 Thousand/uL (ref 3.8–10.8)

## 2024-10-20 LAB — LIPID PANEL
Cholesterol: 303 mg/dL — ABNORMAL HIGH
HDL: 53 mg/dL
LDL Cholesterol (Calc): 198 mg/dL — ABNORMAL HIGH
Non-HDL Cholesterol (Calc): 250 mg/dL — ABNORMAL HIGH
Total CHOL/HDL Ratio: 5.7 (calc) — ABNORMAL HIGH
Triglycerides: 308 mg/dL — ABNORMAL HIGH

## 2024-10-20 LAB — COMPREHENSIVE METABOLIC PANEL WITH GFR
AG Ratio: 1.4 (calc) (ref 1.0–2.5)
ALT: 16 U/L (ref 6–29)
AST: 17 U/L (ref 10–35)
Albumin: 4.2 g/dL (ref 3.6–5.1)
Alkaline phosphatase (APISO): 111 U/L (ref 37–153)
BUN/Creatinine Ratio: 35 (calc) — ABNORMAL HIGH (ref 6–22)
BUN: 18 mg/dL (ref 7–25)
CO2: 30 mmol/L (ref 20–32)
Calcium: 9.9 mg/dL (ref 8.6–10.4)
Chloride: 103 mmol/L (ref 98–110)
Creat: 0.51 mg/dL — ABNORMAL LOW (ref 0.60–1.00)
Globulin: 3 g/dL (ref 1.9–3.7)
Glucose, Bld: 97 mg/dL (ref 65–99)
Potassium: 4.5 mmol/L (ref 3.5–5.3)
Sodium: 141 mmol/L (ref 135–146)
Total Bilirubin: 0.4 mg/dL (ref 0.2–1.2)
Total Protein: 7.2 g/dL (ref 6.1–8.1)
eGFR: 100 mL/min/1.73m2

## 2024-10-20 LAB — CULTURE, URINE COMPREHENSIVE
MICRO NUMBER:: 17456282
SPECIMEN QUALITY:: ADEQUATE

## 2024-10-20 LAB — VITAMIN D 25 HYDROXY (VIT D DEFICIENCY, FRACTURES): Vit D, 25-Hydroxy: 28 ng/mL — ABNORMAL LOW (ref 30–100)

## 2024-10-20 LAB — TSH: TSH: 1.71 m[IU]/L (ref 0.40–4.50)

## 2024-10-23 ENCOUNTER — Other Ambulatory Visit: Payer: Self-pay | Admitting: Family Medicine

## 2024-10-23 MED ORDER — SULFAMETHOXAZOLE-TRIMETHOPRIM 400-80 MG PO TABS: 1.0000 | ORAL_TABLET | Freq: Two times a day (BID) | ORAL | 0 refills | Status: AC

## 2024-10-27 ENCOUNTER — Encounter: Payer: Self-pay | Admitting: Family Medicine

## 2025-04-18 ENCOUNTER — Ambulatory Visit: Admitting: Family Medicine

## 2025-06-22 ENCOUNTER — Ambulatory Visit
